# Patient Record
Sex: Female | Born: 1975 | Race: White | Hispanic: No | Marital: Married | Smoking: Never smoker
Health system: Southern US, Community
[De-identification: ages and names within clinical notes are randomized; demographics above are authoritative.]

## PROBLEM LIST (undated history)

## (undated) DIAGNOSIS — R519 Headache, unspecified: Secondary | ICD-10-CM

## (undated) DIAGNOSIS — T753XXA Motion sickness, initial encounter: Secondary | ICD-10-CM

## (undated) DIAGNOSIS — R000 Tachycardia, unspecified: Secondary | ICD-10-CM

## (undated) DIAGNOSIS — Z973 Presence of spectacles and contact lenses: Secondary | ICD-10-CM

## (undated) DIAGNOSIS — Z9889 Other specified postprocedural states: Secondary | ICD-10-CM

## (undated) DIAGNOSIS — R112 Nausea with vomiting, unspecified: Secondary | ICD-10-CM

## (undated) DIAGNOSIS — T8859XA Other complications of anesthesia, initial encounter: Secondary | ICD-10-CM

## (undated) DIAGNOSIS — I10 Essential (primary) hypertension: Secondary | ICD-10-CM

## (undated) DIAGNOSIS — G629 Polyneuropathy, unspecified: Secondary | ICD-10-CM

## (undated) DIAGNOSIS — E785 Hyperlipidemia, unspecified: Secondary | ICD-10-CM

## (undated) DIAGNOSIS — T4145XA Adverse effect of unspecified anesthetic, initial encounter: Secondary | ICD-10-CM

## (undated) DIAGNOSIS — R51 Headache: Secondary | ICD-10-CM

## (undated) HISTORY — PX: TONSILLECTOMY: SUR1361

## (undated) HISTORY — PX: ESOPHAGOGASTRODUODENOSCOPY: SHX1529

## (undated) HISTORY — DX: Hyperlipidemia, unspecified: E78.5

## (undated) HISTORY — PX: CHOLECYSTECTOMY: SHX55

## (undated) HISTORY — PX: TUBAL LIGATION: SHX77

## (undated) HISTORY — PX: ENDOMETRIAL ABLATION: SHX621

---

## 2002-08-08 HISTORY — PX: REDUCTION MAMMAPLASTY: SUR839

## 2005-02-04 ENCOUNTER — Emergency Department: Payer: Self-pay | Admitting: Emergency Medicine

## 2005-06-13 ENCOUNTER — Inpatient Hospital Stay: Payer: Self-pay | Admitting: General Surgery

## 2005-06-16 ENCOUNTER — Ambulatory Visit: Payer: Self-pay | Admitting: General Surgery

## 2006-05-11 ENCOUNTER — Ambulatory Visit: Payer: Self-pay | Admitting: Unknown Physician Specialty

## 2006-06-27 ENCOUNTER — Ambulatory Visit: Payer: Self-pay | Admitting: Gastroenterology

## 2006-09-14 ENCOUNTER — Ambulatory Visit: Payer: Self-pay | Admitting: Gastroenterology

## 2007-05-23 ENCOUNTER — Ambulatory Visit: Payer: Self-pay | Admitting: Family Medicine

## 2010-09-01 ENCOUNTER — Ambulatory Visit: Payer: Self-pay

## 2013-10-22 ENCOUNTER — Ambulatory Visit: Payer: Self-pay

## 2013-11-01 ENCOUNTER — Ambulatory Visit: Payer: Self-pay

## 2015-08-04 ENCOUNTER — Other Ambulatory Visit: Payer: Self-pay | Admitting: Nurse Practitioner

## 2015-08-04 DIAGNOSIS — Z1231 Encounter for screening mammogram for malignant neoplasm of breast: Secondary | ICD-10-CM

## 2015-08-24 ENCOUNTER — Ambulatory Visit
Admission: RE | Admit: 2015-08-24 | Discharge: 2015-08-24 | Disposition: A | Discharge status: Home / Self Care | Payer: BC Managed Care – PPO | Source: Ambulatory Visit | Attending: Nurse Practitioner | Admitting: Nurse Practitioner

## 2015-08-24 DIAGNOSIS — Z1231 Encounter for screening mammogram for malignant neoplasm of breast: Secondary | ICD-10-CM

## 2016-05-05 DIAGNOSIS — G5622 Lesion of ulnar nerve, left upper limb: Secondary | ICD-10-CM | POA: Insufficient documentation

## 2016-06-01 ENCOUNTER — Encounter: Payer: Self-pay | Admitting: *Deleted

## 2016-06-08 ENCOUNTER — Ambulatory Visit
Admission: RE | Admit: 2016-06-08 | Discharge: 2016-06-08 | Disposition: A | Discharge status: Home / Self Care | Payer: BC Managed Care – PPO | Source: Ambulatory Visit | Attending: Surgery | Admitting: Surgery

## 2016-06-08 ENCOUNTER — Encounter
Admission: RE | Disposition: A | Discharge status: Home / Self Care | Payer: Self-pay | Source: Ambulatory Visit | Attending: Surgery

## 2016-06-08 ENCOUNTER — Ambulatory Visit: Payer: BC Managed Care – PPO | Admitting: Anesthesiology

## 2016-06-08 DIAGNOSIS — G5622 Lesion of ulnar nerve, left upper limb: Secondary | ICD-10-CM | POA: Insufficient documentation

## 2016-06-08 DIAGNOSIS — I10 Essential (primary) hypertension: Secondary | ICD-10-CM | POA: Diagnosis not present

## 2016-06-08 DIAGNOSIS — Z79899 Other long term (current) drug therapy: Secondary | ICD-10-CM | POA: Insufficient documentation

## 2016-06-08 HISTORY — DX: Tachycardia, unspecified: R00.0

## 2016-06-08 HISTORY — DX: Other complications of anesthesia, initial encounter: T88.59XA

## 2016-06-08 HISTORY — DX: Headache: R51

## 2016-06-08 HISTORY — DX: Nausea with vomiting, unspecified: Z98.890

## 2016-06-08 HISTORY — DX: Presence of spectacles and contact lenses: Z97.3

## 2016-06-08 HISTORY — DX: Essential (primary) hypertension: I10

## 2016-06-08 HISTORY — DX: Adverse effect of unspecified anesthetic, initial encounter: T41.45XA

## 2016-06-08 HISTORY — PX: ULNAR NERVE TRANSPOSITION: SHX2595

## 2016-06-08 HISTORY — DX: Motion sickness, initial encounter: T75.3XXA

## 2016-06-08 HISTORY — DX: Headache, unspecified: R51.9

## 2016-06-08 HISTORY — DX: Other specified postprocedural states: R11.2

## 2016-06-08 SURGERY — ULNAR NERVE DECOMPRESSION/TRANSPOSITION
Anesthesia: Regional | Laterality: Left | Wound class: Clean

## 2016-06-08 MED ORDER — ACETAMINOPHEN 160 MG/5ML PO SOLN: 325.0000 mg | ORAL | Status: DC | PRN

## 2016-06-08 MED ORDER — METOCLOPRAMIDE HCL 5 MG/ML IJ SOLN: 5.0000 mg | Freq: Three times a day (TID) | INTRAMUSCULAR | Status: DC | PRN

## 2016-06-08 MED ORDER — OXYCODONE HCL 5 MG PO TABS: 5.0000 mg | ORAL_TABLET | Freq: Once | ORAL | Status: DC | PRN

## 2016-06-08 MED ORDER — LIDOCAINE HCL (CARDIAC) 20 MG/ML IV SOLN
INTRAVENOUS | Status: DC | PRN
  Administered 2016-06-08: 50 mg via INTRAVENOUS

## 2016-06-08 MED ORDER — HYDROCODONE-ACETAMINOPHEN 5-325 MG PO TABS: 1.0000 | ORAL_TABLET | Freq: Four times a day (QID) | ORAL | 0 refills | Status: DC | PRN

## 2016-06-08 MED ORDER — ONDANSETRON HCL 4 MG/2ML IJ SOLN: 4.0000 mg | Freq: Once | INTRAMUSCULAR | Status: DC | PRN

## 2016-06-08 MED ORDER — HYDROCODONE-ACETAMINOPHEN 5-325 MG PO TABS: 1.0000 | ORAL_TABLET | ORAL | Status: DC | PRN

## 2016-06-08 MED ORDER — METOCLOPRAMIDE HCL 5 MG PO TABS: 5.0000 mg | ORAL_TABLET | Freq: Three times a day (TID) | ORAL | Status: DC | PRN

## 2016-06-08 MED ORDER — SCOPOLAMINE 1 MG/3DAYS TD PT72
1.0000 | MEDICATED_PATCH | Freq: Once | TRANSDERMAL | Status: DC
  Administered 2016-06-08: 1.5 mg via TRANSDERMAL

## 2016-06-08 MED ORDER — BUPIVACAINE HCL (PF) 0.5 % IJ SOLN
INTRAMUSCULAR | Status: DC | PRN
  Administered 2016-06-08: 10 mL

## 2016-06-08 MED ORDER — FENTANYL CITRATE (PF) 100 MCG/2ML IJ SOLN
INTRAMUSCULAR | Status: DC | PRN
  Administered 2016-06-08: 100 ug via INTRAVENOUS

## 2016-06-08 MED ORDER — ACETAMINOPHEN 325 MG PO TABS: 325.0000 mg | ORAL_TABLET | ORAL | Status: DC | PRN

## 2016-06-08 MED ORDER — CEFAZOLIN SODIUM-DEXTROSE 2-4 GM/100ML-% IV SOLN
2.0000 g | Freq: Once | INTRAVENOUS | Status: AC
  Administered 2016-06-08: 2 g via INTRAVENOUS

## 2016-06-08 MED ORDER — OXYCODONE HCL 5 MG/5ML PO SOLN: 5.0000 mg | Freq: Once | ORAL | Status: DC | PRN

## 2016-06-08 MED ORDER — LACTATED RINGERS IV SOLN
INTRAVENOUS | Status: DC
  Administered 2016-06-08 (×2): via INTRAVENOUS

## 2016-06-08 MED ORDER — ROPIVACAINE HCL 5 MG/ML IJ SOLN
INTRAMUSCULAR | Status: DC | PRN
  Administered 2016-06-08: 35 mL via PERINEURAL

## 2016-06-08 MED ORDER — POTASSIUM CHLORIDE IN NACL 20-0.9 MEQ/L-% IV SOLN: INTRAVENOUS | Status: DC

## 2016-06-08 MED ORDER — ONDANSETRON HCL 4 MG/2ML IJ SOLN
INTRAMUSCULAR | Status: DC | PRN
  Administered 2016-06-08: 4 mg via INTRAVENOUS

## 2016-06-08 MED ORDER — PROPOFOL 500 MG/50ML IV EMUL
INTRAVENOUS | Status: DC | PRN
  Administered 2016-06-08: 50 ug/kg/min via INTRAVENOUS

## 2016-06-08 MED ORDER — FENTANYL CITRATE (PF) 100 MCG/2ML IJ SOLN: 25.0000 ug | INTRAMUSCULAR | Status: DC | PRN

## 2016-06-08 MED ORDER — MIDAZOLAM HCL 2 MG/2ML IJ SOLN
INTRAMUSCULAR | Status: DC | PRN
  Administered 2016-06-08: 2 mg via INTRAVENOUS

## 2016-06-08 MED ORDER — DEXAMETHASONE SODIUM PHOSPHATE 4 MG/ML IJ SOLN
INTRAMUSCULAR | Status: DC | PRN
  Administered 2016-06-08: 8 mg via INTRAVENOUS
  Administered 2016-06-08: 4 mg via PERINEURAL

## 2016-06-08 MED ORDER — ONDANSETRON HCL 4 MG/2ML IJ SOLN: 4.0000 mg | Freq: Four times a day (QID) | INTRAMUSCULAR | Status: DC | PRN

## 2016-06-08 MED ORDER — ONDANSETRON HCL 4 MG PO TABS: 4.0000 mg | ORAL_TABLET | Freq: Four times a day (QID) | ORAL | Status: DC | PRN

## 2016-06-08 SURGICAL SUPPLY — 31 items
ADHESIVE MASTISOL STRL (MISCELLANEOUS) ×2 IMPLANT
BANDAGE ELASTIC 3 LF NS (GAUZE/BANDAGES/DRESSINGS) ×2 IMPLANT
BANDAGE ELASTIC 4 LF NS (GAUZE/BANDAGES/DRESSINGS) ×2 IMPLANT
BNDG COHESIVE 4X5 TAN STRL (GAUZE/BANDAGES/DRESSINGS) ×2 IMPLANT
BNDG ESMARK 4X12 TAN STRL LF (GAUZE/BANDAGES/DRESSINGS) ×2 IMPLANT
CANISTER SUCT 1200ML W/VALVE (MISCELLANEOUS) ×2 IMPLANT
CHLORAPREP W/TINT 26ML (MISCELLANEOUS) ×2 IMPLANT
CORD BIP STRL DISP 12FT (MISCELLANEOUS) ×2 IMPLANT
COVER LIGHT HANDLE UNIVERSAL (MISCELLANEOUS) ×4 IMPLANT
DRAPE U-SHAPE 48X52 POLY STRL (PACKS) ×2 IMPLANT
GAUZE PETRO XEROFOAM 1X8 (MISCELLANEOUS) ×2 IMPLANT
GAUZE SPONGE 4X4 12PLY STRL (GAUZE/BANDAGES/DRESSINGS) ×2 IMPLANT
GLOVE BIO SURGEON STRL SZ7.5 (GLOVE) ×2 IMPLANT
GLOVE INDICATOR 8.0 STRL GRN (GLOVE) ×2 IMPLANT
GOWN STRL REUS W/ TWL LRG LVL3 (GOWN DISPOSABLE) ×1 IMPLANT
GOWN STRL REUS W/ TWL XL LVL3 (GOWN DISPOSABLE) ×1 IMPLANT
GOWN STRL REUS W/TWL LRG LVL3 (GOWN DISPOSABLE) ×1
GOWN STRL REUS W/TWL XL LVL3 (GOWN DISPOSABLE) ×1
KIT ROOM TURNOVER OR (KITS) ×2 IMPLANT
LOOP VESSEL RED MINI 1.3X0.9 (MISCELLANEOUS) ×1 IMPLANT
LOOPS RED MINI 1.3MMX0.9MM (MISCELLANEOUS) ×1
NS IRRIG 500ML POUR BTL (IV SOLUTION) ×2 IMPLANT
PACK EXTREMITY ARMC (MISCELLANEOUS) ×2 IMPLANT
PAD GROUND ADULT SPLIT (MISCELLANEOUS) ×2 IMPLANT
STOCKINETTE IMPERVIOUS LG (DRAPES) ×2 IMPLANT
STRAP BODY AND KNEE 60X3 (MISCELLANEOUS) ×2 IMPLANT
STRIP CLOSURE SKIN 1/4X4 (GAUZE/BANDAGES/DRESSINGS) ×2 IMPLANT
SUT VIC AB 2-0 CT1 27 (SUTURE) ×1
SUT VIC AB 2-0 CT1 TAPERPNT 27 (SUTURE) ×1 IMPLANT
SUT VIC AB 3-0 SH 27 (SUTURE) ×1
SUT VIC AB 3-0 SH 27X BRD (SUTURE) ×1 IMPLANT

## 2016-06-08 NOTE — Progress Notes (Signed)
Assisted Clance Boll ANMD with left, ultrasound guided, supraclavicular block. Side rails up, monitors on throughout procedure. See vital signs in flow sheet. Tolerated Procedure well.

## 2016-06-08 NOTE — Transfer of Care (Signed)
Immediate Anesthesia Transfer of Care Note  Patient: Yvette Humphrey  Procedure(s) Performed: Procedure(s): SUBCUTANEOUS ANTERIOR ULNAR NERVE TRANSPOSITION LEFT ELBOW (Left)  Patient Location: PACU  Anesthesia Type: Regional  Level of Consciousness: awake, alert  and patient cooperative  Airway and Oxygen Therapy: Patient Spontanous Breathing and Patient connected to supplemental oxygen  Post-op Assessment: Post-op Vital signs reviewed, Patient's Cardiovascular Status Stable, Respiratory Function Stable, Patent Airway and No signs of Nausea or vomiting  Post-op Vital Signs: Reviewed and stable  Complications: No apparent anesthesia complications

## 2016-06-08 NOTE — Anesthesia Preprocedure Evaluation (Signed)
Anesthesia Evaluation  Patient identified by MRN, date of birth, ID band Patient awake    Reviewed: Allergy & Precautions, H&P , NPO status , Patient's Chart, lab work & pertinent test results  History of Anesthesia Complications (+) PONV and history of anesthetic complications  Airway Mallampati: II  TM Distance: >3 FB Neck ROM: full    Dental no notable dental hx.    Pulmonary    Pulmonary exam normal        Cardiovascular hypertension, Normal cardiovascular exam+ dysrhythmias      Neuro/Psych  Headaches,    GI/Hepatic   Endo/Other    Renal/GU      Musculoskeletal   Abdominal   Peds  Hematology   Anesthesia Other Findings   Reproductive/Obstetrics                             Anesthesia Physical Anesthesia Plan  ASA: II  Anesthesia Plan: Regional   Post-op Pain Management:    Induction:   Airway Management Planned:   Additional Equipment:   Intra-op Plan:   Post-operative Plan:   Informed Consent: I have reviewed the patients History and Physical, chart, labs and discussed the procedure including the risks, benefits and alternatives for the proposed anesthesia with the patient or authorized representative who has indicated his/her understanding and acceptance.     Plan Discussed with:   Anesthesia Plan Comments:         Anesthesia Quick Evaluation

## 2016-06-08 NOTE — Discharge Instructions (Addendum)
General Anesthesia, Adult, Care After Refer to this sheet in the next few weeks. These instructions provide you with information on caring for yourself after your procedure. Your health care provider may also give you more specific instructions. Your treatment has been planned according to current medical practices, but problems sometimes occur. Call your health care provider if you have any problems or questions after your procedure. WHAT TO EXPECT AFTER THE PROCEDURE After the procedure, it is typical to experience:  Sleepiness.  Nausea and vomiting. HOME CARE INSTRUCTIONS  For the first 24 hours after general anesthesia:  Have a responsible person with you.  Do not drive a car. If you are alone, do not take public transportation.  Do not drink alcohol.  Do not take medicine that has not been prescribed by your health care provider.  Do not sign important papers or make important decisions.  You may resume a normal diet and activities as directed by your health care provider.  Change bandages (dressings) as directed.  If you have questions or problems that seem related to general anesthesia, call the hospital and ask for the anesthetist or anesthesiologist on call. SEEK MEDICAL CARE IF:  You have nausea and vomiting that continue the day after anesthesia.  You develop a rash. SEEK IMMEDIATE MEDICAL CARE IF:   You have difficulty breathing.  You have chest pain.  You have any allergic problems.   This information is not intended to replace advice given to you by your health care provider. Make sure you discuss any questions you have with your health care provider.   Document Released: 10/31/2000 Document Revised: 08/15/2014 Document Reviewed: 11/23/2011 Elsevier Interactive Patient Education 2016 [employer].  Keep dressing dry and intact. Keep hand elevated above heart level. May shower after dressing removed on postop day 4 (Sunday). Cover sutures with Band-Aids  or Ace-wrap after drying off. Use sling as necessary for comfort. Apply ice to affected area frequently. Take ibuprofen 600 mg TID with meals for 7-10 days, then as necessary. Take pain medication as prescribed when needed.  Return for follow-up in 10-14 days or as scheduled.

## 2016-06-08 NOTE — Anesthesia Postprocedure Evaluation (Signed)
Anesthesia Post Note  Patient: ADLYN FIFE  Procedure(s) Performed: Procedure(s) (LRB): SUBCUTANEOUS ANTERIOR ULNAR NERVE TRANSPOSITION LEFT ELBOW (Left)  Patient location during evaluation: PACU Anesthesia Type: Regional and MAC Level of consciousness: awake and alert and oriented Pain management: satisfactory to patient Vital Signs Assessment: post-procedure vital signs reviewed and stable Respiratory status: spontaneous breathing, nonlabored ventilation and respiratory function stable Cardiovascular status: blood pressure returned to baseline and stable Postop Assessment: Adequate PO intake and No signs of nausea or vomiting Anesthetic complications: no    Raliegh Ip

## 2016-06-08 NOTE — H&P (Signed)
Paper H&P to be scanned into permanent record. H&P reviewed. No changes.

## 2016-06-08 NOTE — Anesthesia Procedure Notes (Signed)
Anesthesia Regional Block:  Supraclavicular block  Pre-Anesthetic Checklist: ,, timeout performed, Correct Patient, Correct Site, Correct Laterality, Correct Procedure, Correct Position, site marked, Risks and benefits discussed, Surgical consent,  Pre-op evaluation,  At surgeon's request  Laterality: Left  Prep: chloraprep       Needles:  Injection technique: Single-shot  Needle Type: Stimiplex      Needle Gauge: 21 and 21 G    Additional Needles:  Procedures: ultrasound guided (picture in chart) Supraclavicular block Narrative:  Start time: 06/08/2016 11:56 AM End time: 06/08/2016 12:02 PM Injection made incrementally with aspirations every 5 mL.  Performed by: Personally  Anesthesiologist: Ronelle Nigh  Additional Notes: Functioning IV was confirmed and monitors applied.  Sterile prep and drape,hand hygiene and sterile gloves were used.Ultrasound guidance: relevant anatomy identified, needle position confirmed, local anesthetic spread visualized around nerve(s)., vascular puncture avoided.  Image printed for medical record.  Negative aspiration and negative test dose prior to incremental administration of local anesthetic. The patient tolerated the procedure well. Vitals signes recorded in RN notes.

## 2016-06-08 NOTE — Op Note (Signed)
06/08/2016  1:41 PM  Patient:   Yvette Humphrey  Pre-Op Diagnosis:   Left cubital tunnel syndrome.  Post-Op Diagnosis:   Same  Procedure:   Subcutaneous anterior transposition ulnar nerve, left elbow.  Surgeon:   Pascal Lux, MD  Assistant:   None  Anesthesia:   Interscalene block with IV sedation  Findings:   As above.  Complications:   None  EBL:   2 cc  Fluids:   1100 cc crystalloid  TT:   46 minutes at 250 mmHg  Drains:   None  Closure:   3-0 Vicryl subcuticular sutures  Brief Clinical Note:   The patient is a 40 year old female with a history of pain and paresthesias radiating from the elbow to the left ring and little fingers of her left hand. The patient's history and examination were consistent with cubital tunnel syndrome, although an EMG was inconclusive. The patient presents at this time for subcutaneous anterior transposition of the ulnar nerve at the left elbow.  Procedure:   The patient underwent placement of an interscalene block in the preoperative holding area before she was brought into the operating room and lain in the supine position. After adequate IV sedation was obtained, the patient's left upper extremity was prepped with ChloraPrep solution before being draped sterilely. Preoperative antibiotics were administered. After performing a timeout to verify the appropriate surgical site, the limb was exsanguinated with an Esmarch and the tourniquet inflated to 250 mmHg. An approximately 6-7 cm curvilinear incision was made along the course of the ulnar nerve posterior to the medial epicondyle. The incision was carried down through the subcutaneous tissues with care taken to avoid the small branches of the medial antebrachial nerve to expose the sheath overlying the cubital tunnel. The ulnar nerve was identified at the proximal end of the tunnel and was dissected free. The nerve was then carefully followed as the roof of the cubital tunnel was released from  proximal to distal. Distally, the fascia overlying the pronator muscle was released for several centimeters. The nerve was clearly thickened just proximal to the pronator fascia, indicating the most likely source of the nerve compression. A vessel loop was passed around the nerve and used to provide gentle traction on the nerve while circumferential dissection was carried out under loupe magnification using bipolar electrocautery and tenotomy scissors.   Once the nerve was fully mobilized, the anterior tissues were elevated as a flap just superficial to the fascia overlying the flexor wad and a pocket created to accept the nerve. Care was taken to be sure that there was no undue tension along the nerve either proximally or distally. The nerve was carefully retracted while several 2-0 Vicryl interrupted sutures were placed to reapproximate the flap to the medial epicondylar soft tissues, thereby creating a "sling" for the ulnar nerve. The cubital tunnel itself was reapproximated using several 2-0 Vicryl interrupted sutures in order to prevent the nerve from falling back into the cubital tunnel.   The wound was copiously irrigated with sterile saline solution before the subcutaneous tissues were closed using 2-0 Vicryl interrupted sutures. The skin was closed using 3-0 Vicryl subcuticular sutures before applying benzoin and Steri-Strips to the skin. A total of 10 cc of 0.5% plain Sensorcaine was injected in and around the incision to help with postoperative analgesia. A sterile bulky dressing was applied to the arm before the patient was placed into a sling. The patient was then awakened, extubated, and returned to the recovery room in  satisfactory condition after tolerating the procedure well.

## 2016-06-09 ENCOUNTER — Encounter: Payer: Self-pay | Admitting: Surgery

## 2016-12-02 ENCOUNTER — Other Ambulatory Visit: Payer: Self-pay | Admitting: Nurse Practitioner

## 2016-12-02 DIAGNOSIS — Z1231 Encounter for screening mammogram for malignant neoplasm of breast: Secondary | ICD-10-CM

## 2016-12-29 ENCOUNTER — Ambulatory Visit
Admission: RE | Admit: 2016-12-29 | Discharge: 2016-12-29 | Disposition: A | Discharge status: Home / Self Care | Payer: BC Managed Care – PPO | Source: Ambulatory Visit | Attending: Nurse Practitioner | Admitting: Nurse Practitioner

## 2016-12-29 DIAGNOSIS — R928 Other abnormal and inconclusive findings on diagnostic imaging of breast: Secondary | ICD-10-CM | POA: Insufficient documentation

## 2016-12-29 DIAGNOSIS — Z1231 Encounter for screening mammogram for malignant neoplasm of breast: Secondary | ICD-10-CM

## 2017-01-03 ENCOUNTER — Other Ambulatory Visit: Payer: Self-pay | Admitting: Nurse Practitioner

## 2017-01-03 DIAGNOSIS — N6320 Unspecified lump in the left breast, unspecified quadrant: Secondary | ICD-10-CM

## 2017-01-03 DIAGNOSIS — R928 Other abnormal and inconclusive findings on diagnostic imaging of breast: Secondary | ICD-10-CM

## 2017-01-04 ENCOUNTER — Ambulatory Visit
Admission: RE | Admit: 2017-01-04 | Discharge: 2017-01-04 | Disposition: A | Discharge status: Home / Self Care | Payer: BC Managed Care – PPO | Source: Ambulatory Visit | Attending: Nurse Practitioner | Admitting: Nurse Practitioner

## 2017-01-04 DIAGNOSIS — N6320 Unspecified lump in the left breast, unspecified quadrant: Secondary | ICD-10-CM

## 2017-01-04 DIAGNOSIS — N6321 Unspecified lump in the left breast, upper outer quadrant: Secondary | ICD-10-CM | POA: Diagnosis not present

## 2017-01-04 DIAGNOSIS — R928 Other abnormal and inconclusive findings on diagnostic imaging of breast: Secondary | ICD-10-CM

## 2017-07-27 ENCOUNTER — Other Ambulatory Visit: Payer: Self-pay | Admitting: Nurse Practitioner

## 2017-07-27 DIAGNOSIS — F5101 Primary insomnia: Secondary | ICD-10-CM

## 2017-07-27 MED ORDER — ZOLPIDEM TARTRATE 10 MG PO TABS: 10.0000 mg | ORAL_TABLET | Freq: Every evening | ORAL | 2 refills | Status: DC | PRN

## 2017-07-27 NOTE — Progress Notes (Signed)
Approved request for ambien with 2 refills.

## 2017-08-03 ENCOUNTER — Ambulatory Visit: Payer: Self-pay | Admitting: Nurse Practitioner

## 2017-08-03 DIAGNOSIS — R5381 Other malaise: Secondary | ICD-10-CM | POA: Insufficient documentation

## 2017-08-03 DIAGNOSIS — H6123 Impacted cerumen, bilateral: Secondary | ICD-10-CM | POA: Insufficient documentation

## 2017-08-03 DIAGNOSIS — D751 Secondary polycythemia: Secondary | ICD-10-CM | POA: Insufficient documentation

## 2017-08-03 DIAGNOSIS — F331 Major depressive disorder, recurrent, moderate: Secondary | ICD-10-CM | POA: Insufficient documentation

## 2017-08-03 DIAGNOSIS — J0191 Acute recurrent sinusitis, unspecified: Secondary | ICD-10-CM | POA: Insufficient documentation

## 2017-08-03 DIAGNOSIS — I10 Essential (primary) hypertension: Secondary | ICD-10-CM | POA: Insufficient documentation

## 2017-08-03 DIAGNOSIS — G43019 Migraine without aura, intractable, without status migrainosus: Secondary | ICD-10-CM | POA: Insufficient documentation

## 2017-08-03 DIAGNOSIS — J301 Allergic rhinitis due to pollen: Secondary | ICD-10-CM | POA: Insufficient documentation

## 2017-08-03 DIAGNOSIS — F5105 Insomnia due to other mental disorder: Secondary | ICD-10-CM | POA: Insufficient documentation

## 2017-08-03 DIAGNOSIS — M26609 Unspecified temporomandibular joint disorder, unspecified side: Secondary | ICD-10-CM | POA: Insufficient documentation

## 2017-08-03 DIAGNOSIS — M722 Plantar fascial fibromatosis: Secondary | ICD-10-CM | POA: Insufficient documentation

## 2017-08-03 DIAGNOSIS — M7712 Lateral epicondylitis, left elbow: Secondary | ICD-10-CM | POA: Insufficient documentation

## 2017-08-03 DIAGNOSIS — F411 Generalized anxiety disorder: Secondary | ICD-10-CM | POA: Insufficient documentation

## 2017-09-01 ENCOUNTER — Encounter: Payer: Self-pay | Admitting: Nurse Practitioner

## 2017-09-01 ENCOUNTER — Ambulatory Visit: Payer: BC Managed Care – PPO | Admitting: Nurse Practitioner

## 2017-09-01 VITALS — BP 123/78 | HR 86 | Resp 16 | Ht 62.0 in | Wt 182.6 lb

## 2017-09-01 DIAGNOSIS — F5101 Primary insomnia: Secondary | ICD-10-CM

## 2017-09-01 DIAGNOSIS — G43009 Migraine without aura, not intractable, without status migrainosus: Secondary | ICD-10-CM | POA: Diagnosis not present

## 2017-09-01 DIAGNOSIS — F411 Generalized anxiety disorder: Secondary | ICD-10-CM | POA: Diagnosis not present

## 2017-09-01 DIAGNOSIS — I10 Essential (primary) hypertension: Secondary | ICD-10-CM

## 2017-09-01 DIAGNOSIS — R928 Other abnormal and inconclusive findings on diagnostic imaging of breast: Secondary | ICD-10-CM

## 2017-09-01 MED ORDER — SERTRALINE HCL 50 MG PO TABS: 75.0000 mg | ORAL_TABLET | Freq: Every day | ORAL | 5 refills | Status: DC

## 2017-09-01 MED ORDER — ALPRAZOLAM 0.5 MG PO TABS: 0.5000 mg | ORAL_TABLET | Freq: Every day | ORAL | 3 refills | Status: DC | PRN

## 2017-09-01 MED ORDER — BUTALBITAL-APAP-CAFFEINE 50-300-40 MG PO CAPS: 1.0000 | ORAL_CAPSULE | ORAL | 3 refills | Status: DC | PRN

## 2017-09-01 NOTE — Progress Notes (Addendum)
[facility] [place], [place] [address]  Internal MEDICINE  Office Visit Note  Patient Name: Yvette Humphrey  [id number]  [id number]  Date of Service: 09/01/2017  Chief Complaint  Patient presents with  . Follow-up    medications    HPI  Pt is here for routine follow up.    Current Medication: Outpatient Encounter Medications as of 09/01/2017  Medication Sig  . ALPRAZolam (XANAX) 0.5 MG tablet 0.5 mg.  . BIOTIN PO Take by mouth daily.  . Butalbital-APAP-Caffeine (FIORICET PO) Take by mouth as needed. migraines  . cetirizine (ZYRTEC) 10 MG tablet Take 10 mg by mouth daily.  . cyanocobalamin ([employer] VITAMIN B12) 1000 MCG tablet Take by mouth.  . cyanocobalamin 500 MCG tablet Take 500 mcg by mouth daily.  Marland Kitchen gabapentin (NEURONTIN) 300 MG capsule Take 300 mg by mouth at bedtime.  Marland Kitchen HYDROcodone-acetaminophen (NORCO) 5-325 MG tablet Take 1-2 tablets by mouth every 6 (six) hours as needed for moderate pain. MAXIMUM TOTAL ACETAMINOPHEN DOSE IS 4000 MG PER DAY  . Lysine 1000 MG TABS Take by mouth daily as needed.  . Magnesium 250 MG TABS Take by mouth daily.  . meloxicam (MOBIC) 7.5 MG tablet Take 7.5 mg by mouth daily.  . metoprolol succinate (TOPROL-XL) 50 MG 24 hr tablet Take 50 mg by mouth daily. Take with or immediately following a meal.  . Multiple Vitamins-Minerals (MULTIVITAMIN WOMEN PO) Take by mouth daily.  Marland Kitchen POTASSIUM CHLORIDE PO Take 10 mcg by mouth daily.  . sertraline (ZOLOFT) 50 MG tablet Take 50 mg by mouth daily.  Marland Kitchen zolpidem (AMBIEN) 10 MG tablet Take 1 tablet (10 mg total) by mouth at bedtime as needed for sleep.   No facility-administered encounter medications on file as of 09/01/2017.     Surgical History: Past Surgical History:  Procedure Laterality Date  . CHOLECYSTECTOMY    . ENDOMETRIAL ABLATION     approx 2009  . ESOPHAGOGASTRODUODENOSCOPY    . REDUCTION MAMMAPLASTY Bilateral 2004  . TONSILLECTOMY    . TUBAL LIGATION    . ULNAR  NERVE TRANSPOSITION Left 06/08/2016   Procedure: SUBCUTANEOUS ANTERIOR ULNAR NERVE TRANSPOSITION LEFT ELBOW;  Surgeon: Corky Mull, MD;  Location: [facility];  Service: Orthopedics;  Laterality: Left;    Medical History: Past Medical History:  Diagnosis Date  . Complication of anesthesia   . Headache    migraines, 1x/mo  . Hypertension   . Motion sickness    back seat cars  . PONV (postoperative nausea and vomiting)   . Tachycardia   . Wears contact lenses     Family History: Family History  Problem Relation Age of Onset  . Breast cancer Paternal Grandmother     Social History   Socioeconomic History  . Marital status: Married    Spouse name: Not on file  . Number of children: Not on file  . Years of education: Not on file  . Highest education level: Not on file  Social Needs  . Financial resource strain: Not on file  . Food insecurity - worry: Not on file  . Food insecurity - inability: Not on file  . Transportation needs - medical: Not on file  . Transportation needs - non-medical: Not on file  Occupational History  . Not on file  Tobacco Use  . Smoking status: Never Smoker  . Smokeless tobacco: Never Used  Substance and Sexual Activity  . Alcohol use: Yes    Alcohol/week: 0.6 oz  Types: 1 Glasses of wine per week  . Drug use: No  . Sexual activity: Not on file  Other Topics Concern  . Not on file  Social History Narrative  . Not on file      Review of Systems  Constitutional: Negative for activity change, chills, fatigue and unexpected weight change.  HENT: Negative for congestion, postnasal drip, rhinorrhea, sneezing and sore throat.   Eyes: Negative for redness.  Respiratory: Negative for cough, chest tightness, shortness of breath and wheezing.   Cardiovascular: Negative for chest pain and palpitations.  Gastrointestinal: Negative for abdominal pain, constipation, diarrhea, nausea and vomiting.  Endocrine: Negative for cold intolerance,  heat intolerance, polydipsia, polyphagia and polyuria.  Genitourinary: Negative for dysuria and frequency.  Musculoskeletal: Negative for arthralgias, back pain, joint swelling and neck pain.  Skin: Negative for rash.  Allergic/Immunologic: Negative.   Neurological: Positive for headaches. Negative for tremors and numbness.  Hematological: Negative.  Negative for adenopathy. Does not bruise/bleed easily.  Psychiatric/Behavioral: Positive for sleep disturbance. Negative for behavioral problems (Depression) and suicidal ideas. The patient is nervous/anxious.     Today's Vitals   09/01/17 1010  BP: 123/78  Pulse: 86  Resp: 16  SpO2: 97%  Weight: 182 lb 9.6 oz (82.8 kg)  Height: 5' 2"  (1.575 m)    Physical Exam  Constitutional: She is oriented to person, place, and time. She appears well-developed and well-nourished. No distress.  HENT:  Head: Normocephalic and atraumatic.  Mouth/Throat: Oropharynx is clear and moist. No oropharyngeal exudate.  Eyes: EOM are normal. Pupils are equal, round, and reactive to light.  Neck: Normal range of motion. Neck supple. No JVD present. No tracheal deviation present. No thyromegaly present.  Cardiovascular: Normal rate, regular rhythm and normal heart sounds. Exam reveals no gallop and no friction rub.  No murmur heard. Pulmonary/Chest: Effort normal and breath sounds normal. No respiratory distress. She has no wheezes. She has no rales. She exhibits no tenderness.  Abdominal: Soft. Bowel sounds are normal. There is no tenderness.  Musculoskeletal: Normal range of motion.  Lymphadenopathy:    She has no cervical adenopathy.  Neurological: She is alert and oriented to person, place, and time. No cranial nerve deficit.  Skin: Skin is warm and dry. She is not diaphoretic.  Psychiatric: She has a normal mood and affect. Her behavior is normal. Judgment and thought content normal.  Nursing note and vitals reviewed.   Assessment/Plan:  1. Essential  (primary) hypertension Stable. Continue bp medication as prescribed   2. Migraine without aura and without status migrainosus, not intractable Overall, well controlled.  - Butalbital-APAP-Caffeine (FIORICET) 50-300-40 MG CAPS; Take 1 capsule by mouth as needed. migraines  Dispense: 30 capsule; Refill: 3  3. Primary insomnia May take zolpidem 61m at bedtime if needed for acute anxiety  4. GAD (generalized anxiety disorder) Generally stable. Continue sertraline 761mevery day. May take alprazolam 0.20m50m/2 to 1 tablet daily if needed.  - sertraline (ZOLOFT) 50 MG tablet; Take 1.5 tablets (75 mg total) by mouth daily.  Dispense: 45 tablet; Refill: 5 - ALPRAZolam (XANAX) 0.5 MG tablet; Take 1 tablet (0.5 mg total) by mouth daily as needed for anxiety.  Dispense: 30 tablet; Refill: 3  5. Abnormal mammogram of left breast - [place] UNI LEFT INC AXILLA; Future  She should return for CPE in 4 months and sooner if needed.  General Counseling: [place] gaylerderstanding of the findings of todays visit and agrees with plan of treatment. I  have discussed any further diagnostic evaluation that may be needed or ordered today. We also reviewed her medications today. she has been encouraged to call the office with any questions or concerns that should arise related to todays visit.    This patient was seen by Leretha Pol, FNP- C in Collaboration with Dr Lavera Guise as a part of collaborative care agreement   Time spent: 31 Minutes     Dr Lavera Guise Internal medicine

## 2017-09-25 ENCOUNTER — Other Ambulatory Visit: Payer: Self-pay | Admitting: Nurse Practitioner

## 2017-09-25 DIAGNOSIS — J0111 Acute recurrent frontal sinusitis: Secondary | ICD-10-CM

## 2017-09-25 MED ORDER — CLARITHROMYCIN 500 MG PO TABS: 500.0000 mg | ORAL_TABLET | Freq: Two times a day (BID) | ORAL | 0 refills | Status: DC

## 2017-09-25 NOTE — Progress Notes (Signed)
Sent in biaxin 556m bid for 10 days.

## 2017-09-29 ENCOUNTER — Other Ambulatory Visit: Payer: Self-pay | Admitting: Nurse Practitioner

## 2017-09-29 DIAGNOSIS — J0111 Acute recurrent frontal sinusitis: Secondary | ICD-10-CM

## 2017-09-29 MED ORDER — PREDNISONE 10 MG (21) PO TBPK: ORAL_TABLET | ORAL | 0 refills | Status: DC

## 2017-09-29 MED ORDER — LEVOFLOXACIN 500 MG PO TABS: 500.0000 mg | ORAL_TABLET | Freq: Every day | ORAL | 0 refills | Status: DC

## 2017-09-29 NOTE — Progress Notes (Signed)
Still not well after treatment with biaxin. Will do levofloxacin 558m daily for 10 days and prednisone 6 day dose pack. Both sent to her pharmacy.

## 2017-10-02 ENCOUNTER — Other Ambulatory Visit: Payer: Self-pay

## 2017-10-02 MED ORDER — FLUCONAZOLE 150 MG PO TABS: ORAL_TABLET | ORAL | 0 refills | Status: DC

## 2017-10-09 ENCOUNTER — Ambulatory Visit
Admission: RE | Admit: 2017-10-09 | Discharge: 2017-10-09 | Disposition: A | Discharge status: Home / Self Care | Payer: BC Managed Care – PPO | Source: Ambulatory Visit | Attending: Nurse Practitioner | Admitting: Nurse Practitioner

## 2017-10-09 ENCOUNTER — Other Ambulatory Visit: Payer: Self-pay | Admitting: Nurse Practitioner

## 2017-10-09 DIAGNOSIS — R928 Other abnormal and inconclusive findings on diagnostic imaging of breast: Secondary | ICD-10-CM

## 2017-10-09 DIAGNOSIS — D242 Benign neoplasm of left breast: Secondary | ICD-10-CM | POA: Insufficient documentation

## 2017-10-31 ENCOUNTER — Other Ambulatory Visit: Payer: Self-pay | Admitting: Nurse Practitioner

## 2017-10-31 ENCOUNTER — Telehealth: Payer: Self-pay

## 2017-10-31 DIAGNOSIS — F5101 Primary insomnia: Secondary | ICD-10-CM

## 2017-10-31 MED ORDER — MELOXICAM 7.5 MG PO TABS: 7.5000 mg | ORAL_TABLET | Freq: Every day | ORAL | 2 refills | Status: DC

## 2017-10-31 MED ORDER — ZOLPIDEM TARTRATE 10 MG PO TABS: 10.0000 mg | ORAL_TABLET | Freq: Every evening | ORAL | 3 refills | Status: DC | PRN

## 2017-10-31 NOTE — Telephone Encounter (Signed)
Refilled zolpidem 66m #30 with 3 refills and sent to [facility].

## 2017-10-31 NOTE — Telephone Encounter (Signed)
Pt

## 2017-10-31 NOTE — Progress Notes (Signed)
Refilled zolpidem 79m #30 with 3 refills and sent to [facility].

## 2017-11-02 ENCOUNTER — Telehealth: Payer: Self-pay

## 2017-11-02 ENCOUNTER — Encounter: Payer: Self-pay | Admitting: Nurse Practitioner

## 2017-11-02 ENCOUNTER — Ambulatory Visit: Payer: BC Managed Care – PPO | Admitting: Nurse Practitioner

## 2017-11-02 VITALS — BP 121/93 | HR 78 | Temp 98.3°F | Resp 16 | Ht 62.0 in | Wt 185.8 lb

## 2017-11-02 DIAGNOSIS — J0111 Acute recurrent frontal sinusitis: Secondary | ICD-10-CM | POA: Diagnosis not present

## 2017-11-02 DIAGNOSIS — B379 Candidiasis, unspecified: Secondary | ICD-10-CM | POA: Insufficient documentation

## 2017-11-02 DIAGNOSIS — R05 Cough: Secondary | ICD-10-CM | POA: Diagnosis not present

## 2017-11-02 DIAGNOSIS — R059 Cough, unspecified: Secondary | ICD-10-CM | POA: Insufficient documentation

## 2017-11-02 MED ORDER — FLUCONAZOLE 150 MG PO TABS: ORAL_TABLET | ORAL | 0 refills | Status: DC

## 2017-11-02 MED ORDER — HYDROCOD POLST-CPM POLST ER 10-8 MG/5ML PO SUER: 5.0000 mL | Freq: Two times a day (BID) | ORAL | 0 refills | Status: DC | PRN

## 2017-11-02 MED ORDER — CLARITHROMYCIN 500 MG PO TABS: 500.0000 mg | ORAL_TABLET | Freq: Two times a day (BID) | ORAL | 0 refills | Status: DC

## 2017-11-02 NOTE — Progress Notes (Signed)
[facility] [place], [place] [address]  Internal MEDICINE  Office Visit Note  Patient Name: Yvette Humphrey  [id number]  [id number]  Date of Service: 11/02/2017  Chief Complaint  Patient presents with  . Cough  . Sinusitis     The patient is here for sick visit. She has had nasal congestion for about a week. Yesterday developed a significant cough with burning in her chest. Chest discomfort is only when she coughs. She was unable to sleep last night, as her cough kept her awake. Today, she took OTC cold and cough medication which did help. She states that cough is productive. Lots of mucus production. She has been treated twice over the past 6 weeks for similar infection. Took round of clarithromycin for 10 days followed by levaquin 571m daily and prednisone taper for 6 days. She did get better, initially. Symptoms restarted about a week or 2 after she finished the second treatment .  Pt is here for a sick visit.     Current Medication:  Outpatient Encounter Medications as of 11/02/2017  Medication Sig  . ALPRAZolam (XANAX) 0.5 MG tablet Take 1 tablet (0.5 mg total) by mouth daily as needed for anxiety.  .Marland KitchenBIOTIN PO Take by mouth daily.  . Butalbital-APAP-Caffeine (FIORICET) 50-300-40 MG CAPS Take 1 capsule by mouth as needed. migraines  . cetirizine (ZYRTEC) 10 MG tablet Take 10 mg by mouth daily.  . chlorpheniramine-HYDROcodone (TUSSIONEX PENNKINETIC ER) 10-8 MG/5ML SUER Take 5 mLs by mouth every 12 (twelve) hours as needed for cough.  . clarithromycin (BIAXIN) 500 MG tablet Take 1 tablet (500 mg total) by mouth 2 (two) times daily.  . cyanocobalamin ([employer] VITAMIN B12) 1000 MCG tablet Take by mouth.  . cyanocobalamin 500 MCG tablet Take 500 mcg by mouth daily.  . fluconazole (DIFLUCAN) 150 MG tablet Take 1 tab po once and then repeat again if symptoms persists  . gabapentin (NEURONTIN) 300 MG capsule Take 300 mg by mouth at bedtime.  .Marland Kitchen HYDROcodone-acetaminophen (NORCO) 5-325 MG tablet Take 1-2 tablets by mouth every 6 (six) hours as needed for moderate pain. MAXIMUM TOTAL ACETAMINOPHEN DOSE IS 4000 MG PER DAY  . levofloxacin (LEVAQUIN) 500 MG tablet Take 1 tablet (500 mg total) by mouth daily. (Patient not taking: Reported on 11/02/2017)  . Lysine 1000 MG TABS Take by mouth daily as needed.  . Magnesium 250 MG TABS Take by mouth daily.  . meloxicam (MOBIC) 7.5 MG tablet Take 1 tablet (7.5 mg total) by mouth daily.  . metoprolol succinate (TOPROL-XL) 50 MG 24 hr tablet Take 50 mg by mouth daily. Take with or immediately following a meal.  . Multiple Vitamins-Minerals (MULTIVITAMIN WOMEN PO) Take by mouth daily.  .Marland KitchenPOTASSIUM CHLORIDE PO Take 10 mcg by mouth daily.  . predniSONE (STERAPRED UNI-PAK 21 TAB) 10 MG (21) TBPK tablet 6 day taper - take by mouth as directed for 6 days (Patient not taking: Reported on 11/02/2017)  . sertraline (ZOLOFT) 50 MG tablet Take 1.5 tablets (75 mg total) by mouth daily.  .Marland Kitchenzolpidem (AMBIEN) 10 MG tablet Take 1 tablet (10 mg total) by mouth at bedtime as needed for sleep.  . [DISCONTINUED] clarithromycin (BIAXIN) 500 MG tablet Take 1 tablet (500 mg total) by mouth 2 (two) times daily.  . [DISCONTINUED] fluconazole (DIFLUCAN) 150 MG tablet Take 1 tab po once and then repeat again if symptoms persists   No facility-administered encounter medications on file as of 11/02/2017.  Medical History: Past Medical History:  Diagnosis Date  . Complication of anesthesia   . Headache    migraines, 1x/mo  . Hypertension   . Motion sickness    back seat cars  . PONV (postoperative nausea and vomiting)   . Tachycardia   . Wears contact lenses      Vital Signs: BP (!) 121/93   Pulse 78   Temp 98.3 F (36.8 C)   Resp 16   Ht 5' 2"  (1.575 m)   Wt 185 lb 12.8 oz (84.3 kg)   SpO2 97%   BMI 33.98 kg/m    Review of Systems  Constitutional: Positive for fatigue and fever. Negative for activity  change, chills and unexpected weight change.  HENT: Positive for congestion, postnasal drip, rhinorrhea, sore throat and voice change. Negative for sneezing.   Eyes: Negative.  Negative for redness.  Respiratory: Positive for cough. Negative for chest tightness, shortness of breath and wheezing.   Cardiovascular: Negative for chest pain and palpitations.  Gastrointestinal: Negative for abdominal pain, constipation, diarrhea, nausea and vomiting.  Endocrine: Negative for cold intolerance, heat intolerance, polydipsia, polyphagia and polyuria.  Genitourinary: Negative.  Negative for dysuria and frequency.  Musculoskeletal: Positive for back pain. Negative for arthralgias, joint swelling and neck pain.  Skin: Negative for rash.  Allergic/Immunologic: Positive for environmental allergies.  Neurological: Positive for headaches. Negative for tremors and numbness.  Hematological: Negative.  Negative for adenopathy. Does not bruise/bleed easily.  Psychiatric/Behavioral: Negative for behavioral problems (Depression), sleep disturbance and suicidal ideas. The patient is not nervous/anxious.     Physical Exam  Constitutional: She is oriented to person, place, and time. She appears well-developed and well-nourished. No distress.  HENT:  Head: Normocephalic and atraumatic.  Right Ear: Tympanic membrane and external ear normal.  Left Ear: There is swelling. Tympanic membrane is erythematous and bulging.  Mouth/Throat: Oropharynx is clear and moist. No oropharyngeal exudate.  Eyes: Pupils are equal, round, and reactive to light. EOM are normal.  Neck: Normal range of motion. Neck supple. No JVD present. No tracheal deviation present. No thyromegaly present.  Cardiovascular: Normal rate, regular rhythm and normal heart sounds. Exam reveals no gallop and no friction rub.  No murmur heard. Pulmonary/Chest: Effort normal and breath sounds normal. No respiratory distress. She has no wheezes. She has no rales.  She exhibits tenderness.  Deep, congested cough which is currently non-productive.   Abdominal: Soft. Bowel sounds are normal. There is no tenderness.  Musculoskeletal: Normal range of motion.  Lymphadenopathy:    She has no cervical adenopathy.  Neurological: She is alert and oriented to person, place, and time. No cranial nerve deficit.  Skin: Skin is warm and dry. She is not diaphoretic.  Psychiatric: She has a normal mood and affect. Her behavior is normal. Judgment and thought content normal.  Nursing note and vitals reviewed.  Assessment/Plan: 1. Acute recurrent frontal sinusitis Start treatment with biaxin 516m twice daily for 10 days. Rest and use OTC medication to help relieve symptoms.  - clarithromycin (BIAXIN) 500 MG tablet; Take 1 tablet (500 mg total) by mouth 2 (two) times daily.  Dispense: 20 tablet; Refill: 0  2. Cough Tussionex cough suppressant to help at night. Advised not to use during the day, as it will cause drowsiness. - chlorpheniramine-HYDROcodone (TUSSIONEX PENNKINETIC ER) 10-8 MG/5ML SUER; Take 5 mLs by mouth every 12 (twelve) hours as needed for cough.  Dispense: 115 mL; Refill: 0  3. Candidiasis - fluconazole (DIFLUCAN) 150 MG tablet;  Take 1 tab po once and then repeat again if symptoms persists  Dispense: 3 tablet; Refill: 0  General Counseling: arraya buck understanding of the findings of todays visit and agrees with plan of treatment. I have discussed any further diagnostic evaluation that may be needed or ordered today. We also reviewed her medications today. she has been encouraged to call the office with any questions or concerns that should arise related to todays visit.     Meds ordered this encounter  Medications  . clarithromycin (BIAXIN) 500 MG tablet    Sig: Take 1 tablet (500 mg total) by mouth 2 (two) times daily.    Dispense:  20 tablet    Refill:  0    Order Specific Question:   Supervising Provider    Answer:   Lavera Guise  [[id number]]  . fluconazole (DIFLUCAN) 150 MG tablet    Sig: Take 1 tab po once and then repeat again if symptoms persists    Dispense:  3 tablet    Refill:  0    Order Specific Question:   Supervising Provider    Answer:   Lavera Guise [place]  . chlorpheniramine-HYDROcodone (TUSSIONEX PENNKINETIC ER) 10-8 MG/5ML SUER    Sig: Take 5 mLs by mouth every 12 (twelve) hours as needed for cough.    Dispense:  115 mL    Refill:  0    Order Specific Question:   Supervising Provider    Answer:   Lavera Guise [[id number]]    Time spent: 15  Minutes

## 2017-11-02 NOTE — Telephone Encounter (Signed)
Hey. Can you call in promethazine/codeina cough suppressant. She can take 5-61ms po TID prn cough. #2063m with no refills. thanks

## 2017-11-03 ENCOUNTER — Telehealth: Payer: Self-pay

## 2017-11-03 NOTE — Telephone Encounter (Signed)
Pt informed of nex rx for cough med sent to pharmacy

## 2017-11-03 NOTE — Telephone Encounter (Signed)
Left vm for pharmacy to fill promethazine codeine cough syrup.  dbs

## 2017-12-26 ENCOUNTER — Encounter: Payer: Self-pay | Admitting: Nurse Practitioner

## 2017-12-26 ENCOUNTER — Ambulatory Visit: Payer: BC Managed Care – PPO | Admitting: Nurse Practitioner

## 2017-12-26 VITALS — BP 126/51 | HR 89 | Resp 16 | Ht 63.0 in | Wt 185.8 lb

## 2017-12-26 DIAGNOSIS — I10 Essential (primary) hypertension: Secondary | ICD-10-CM | POA: Diagnosis not present

## 2017-12-26 DIAGNOSIS — Z1239 Encounter for other screening for malignant neoplasm of breast: Secondary | ICD-10-CM

## 2017-12-26 DIAGNOSIS — Z1231 Encounter for screening mammogram for malignant neoplasm of breast: Secondary | ICD-10-CM | POA: Diagnosis not present

## 2017-12-26 DIAGNOSIS — G43009 Migraine without aura, not intractable, without status migrainosus: Secondary | ICD-10-CM | POA: Insufficient documentation

## 2017-12-26 DIAGNOSIS — Z79899 Other long term (current) drug therapy: Secondary | ICD-10-CM | POA: Insufficient documentation

## 2017-12-26 DIAGNOSIS — M722 Plantar fascial fibromatosis: Secondary | ICD-10-CM | POA: Diagnosis not present

## 2017-12-26 MED ORDER — MELOXICAM 7.5 MG PO TABS: 7.5000 mg | ORAL_TABLET | Freq: Two times a day (BID) | ORAL | 2 refills | Status: DC | PRN

## 2017-12-26 MED ORDER — METOPROLOL SUCCINATE ER 50 MG PO TB24: 50.0000 mg | ORAL_TABLET | Freq: Every day | ORAL | 5 refills | Status: DC

## 2017-12-26 NOTE — Progress Notes (Signed)
[facility] [place], [place] [address]  Internal MEDICINE  Office Visit Note  Patient Name: Yvette Humphrey  [id number]  [id number]  Date of Service: 12/26/2017   Pt is here for routine follow up.  Chief Complaint  Patient presents with  . Foot Pain    bilateral - worse when first getting up in the morning.     The patient c/o bilateral foot pain. This is worse when first getting up in the morning. Will get better, then worse again after she sits or rests for a few minutes. Does not seem to matter what shoes she is wearing or if she is barefoot.       Current Medication: Outpatient Encounter Medications as of 12/26/2017  Medication Sig  . ALPRAZolam (XANAX) 0.5 MG tablet Take 1 tablet (0.5 mg total) by mouth daily as needed for anxiety.  Marland Kitchen BIOTIN PO Take by mouth daily.  . Butalbital-APAP-Caffeine (FIORICET) 50-300-40 MG CAPS Take 1 capsule by mouth as needed. migraines  . cetirizine (ZYRTEC) 10 MG tablet Take 10 mg by mouth daily.  . cyanocobalamin ([employer] VITAMIN B12) 1000 MCG tablet Take by mouth.  . cyanocobalamin 500 MCG tablet Take 500 mcg by mouth daily.  Marland Kitchen Lysine 1000 MG TABS Take by mouth daily as needed.  . Magnesium 250 MG TABS Take by mouth daily.  . meloxicam (MOBIC) 7.5 MG tablet Take 1 tablet (7.5 mg total) by mouth 2 (two) times daily as needed for pain.  . metoprolol succinate (TOPROL-XL) 50 MG 24 hr tablet Take 1 tablet (50 mg total) by mouth daily. Take with or immediately following a meal.  . Multiple Vitamins-Minerals (MULTIVITAMIN WOMEN PO) Take by mouth daily.  . sertraline (ZOLOFT) 50 MG tablet Take 1.5 tablets (75 mg total) by mouth daily.  Marland Kitchen zolpidem (AMBIEN) 10 MG tablet Take 1 tablet (10 mg total) by mouth at bedtime as needed for sleep.  . [DISCONTINUED] meloxicam (MOBIC) 7.5 MG tablet Take 1 tablet (7.5 mg total) by mouth daily.  . [DISCONTINUED] metoprolol succinate (TOPROL-XL) 50 MG 24 hr tablet Take 50 mg by mouth daily.  Take with or immediately following a meal.  . chlorpheniramine-HYDROcodone (TUSSIONEX PENNKINETIC ER) 10-8 MG/5ML SUER Take 5 mLs by mouth every 12 (twelve) hours as needed for cough. (Patient not taking: Reported on 12/26/2017)  . clarithromycin (BIAXIN) 500 MG tablet Take 1 tablet (500 mg total) by mouth 2 (two) times daily. (Patient not taking: Reported on 12/26/2017)  . fluconazole (DIFLUCAN) 150 MG tablet Take 1 tab po once and then repeat again if symptoms persists (Patient not taking: Reported on 12/26/2017)  . levofloxacin (LEVAQUIN) 500 MG tablet Take 1 tablet (500 mg total) by mouth daily. (Patient not taking: Reported on 11/02/2017)  . POTASSIUM CHLORIDE PO Take 10 mcg by mouth daily.  . predniSONE (STERAPRED UNI-PAK 21 TAB) 10 MG (21) TBPK tablet 6 day taper - take by mouth as directed for 6 days (Patient not taking: Reported on 11/02/2017)  . [DISCONTINUED] gabapentin (NEURONTIN) 300 MG capsule Take 300 mg by mouth at bedtime.  . [DISCONTINUED] HYDROcodone-acetaminophen (NORCO) 5-325 MG tablet Take 1-2 tablets by mouth every 6 (six) hours as needed for moderate pain. MAXIMUM TOTAL ACETAMINOPHEN DOSE IS 4000 MG PER DAY (Patient not taking: Reported on 12/26/2017)   No facility-administered encounter medications on file as of 12/26/2017.     Surgical History: Past Surgical History:  Procedure Laterality Date  . CHOLECYSTECTOMY    . ENDOMETRIAL ABLATION  approx 2009  . ESOPHAGOGASTRODUODENOSCOPY    . REDUCTION MAMMAPLASTY Bilateral 2004  . TONSILLECTOMY    . TUBAL LIGATION    . ULNAR NERVE TRANSPOSITION Left 06/08/2016   Procedure: SUBCUTANEOUS ANTERIOR ULNAR NERVE TRANSPOSITION LEFT ELBOW;  Surgeon: Corky Mull, MD;  Location: [facility];  Service: Orthopedics;  Laterality: Left;    Medical History: Past Medical History:  Diagnosis Date  . Complication of anesthesia   . Headache    migraines, 1x/mo  . Hypertension   . Motion sickness    back seat cars  . PONV  (postoperative nausea and vomiting)   . Tachycardia   . Wears contact lenses     Family History: Family History  Problem Relation Age of Onset  . Breast cancer Paternal Grandmother     Social History   Socioeconomic History  . Marital status: Married    Spouse name: Not on file  . Number of children: Not on file  . Years of education: Not on file  . Highest education level: Not on file  Occupational History  . Not on file  Social Needs  . Financial resource strain: Not on file  . Food insecurity:    Worry: Not on file    Inability: Not on file  . Transportation needs:    Medical: Not on file    Non-medical: Not on file  Tobacco Use  . Smoking status: Never Smoker  . Smokeless tobacco: Never Used  Substance and Sexual Activity  . Alcohol use: Yes    Alcohol/week: 0.6 oz    Types: 1 Glasses of wine per week  . Drug use: No  . Sexual activity: Not on file  Lifestyle  . Physical activity:    Days per week: Not on file    Minutes per session: Not on file  . Stress: Not on file  Relationships  . Social connections:    Talks on phone: Not on file    Gets together: Not on file    Attends religious service: Not on file    Active member of club or organization: Not on file    Attends meetings of clubs or organizations: Not on file    Relationship status: Not on file  . Intimate partner violence:    Fear of current or ex partner: Not on file    Emotionally abused: Not on file    Physically abused: Not on file    Forced sexual activity: Not on file  Other Topics Concern  . Not on file  Social History Narrative  . Not on file      Review of Systems  Constitutional: Negative for activity change, chills, fatigue, fever and unexpected weight change.  HENT: Negative for congestion, postnasal drip, sneezing, sore throat and voice change.   Eyes: Negative.  Negative for redness.  Respiratory: Positive for cough. Negative for chest tightness, shortness of breath and  wheezing.   Cardiovascular: Negative for chest pain and palpitations.  Gastrointestinal: Negative for abdominal pain, constipation, diarrhea, nausea and vomiting.  Endocrine: Negative for cold intolerance, heat intolerance, polydipsia, polyphagia and polyuria.  Genitourinary: Negative.  Negative for dysuria and frequency.  Musculoskeletal: Positive for arthralgias and back pain. Negative for joint swelling and neck pain.       Bilateral   Skin: Negative for rash.  Allergic/Immunologic: Positive for environmental allergies.  Neurological: Positive for headaches. Negative for tremors and numbness.  Hematological: Negative.  Negative for adenopathy. Does not bruise/bleed easily.  Psychiatric/Behavioral: Positive  for dysphoric mood and sleep disturbance. Negative for behavioral problems (Depression) and suicidal ideas. The patient is not nervous/anxious.     Today's Vitals   12/26/17 0838  BP: (!) 126/51  Pulse: 89  Resp: 16  SpO2: 97%  Weight: 185 lb 12.8 oz (84.3 kg)  Height: 5' 3"  (1.6 m)    Physical Exam  Constitutional: She is oriented to person, place, and time. She appears well-developed and well-nourished. No distress.  HENT:  Head: Normocephalic and atraumatic.  Mouth/Throat: Oropharynx is clear and moist. No oropharyngeal exudate.  Eyes: Pupils are equal, round, and reactive to light. EOM are normal.  Neck: Normal range of motion. Neck supple. No JVD present. No tracheal deviation present. No thyromegaly present.  Cardiovascular: Normal rate, regular rhythm and normal heart sounds. Exam reveals no gallop and no friction rub.  No murmur heard. Pulmonary/Chest: Effort normal and breath sounds normal. No respiratory distress. She has no wheezes. She has no rales. She exhibits no tenderness.  Abdominal: Soft. Bowel sounds are normal. There is no tenderness.  Musculoskeletal: Normal range of motion.  Lymphadenopathy:    She has no cervical adenopathy.  Neurological: She is  alert and oriented to person, place, and time. No cranial nerve deficit.  Skin: Skin is warm and dry. She is not diaphoretic.  Psychiatric: She has a normal mood and affect. Her behavior is normal. Judgment and thought content normal.  Nursing note and vitals reviewed.  Assessment/Plan: 1. Essential (primary) hypertension Well controlled. Continue metoprolol as prescribed.  - metoprolol succinate (TOPROL-XL) 50 MG 24 hr tablet; Take 1 tablet (50 mg total) by mouth daily. Take with or immediately following a meal.  Dispense: 30 tablet; Refill: 5  2. Plantar fasciitis, bilateral Increased meloxicam 7.61m to twice daily. Advised her to ice and rest the foot when possible. Consider wearing supportive shoes with inserts  - meloxicam (MOBIC) 7.5 MG tablet; Take 1 tablet (7.5 mg total) by mouth 2 (two) times daily as needed for pain.  Dispense: 60 tablet; Refill: 2  3. Screening for breast cancer - MM DIGITAL SCREENING BILATERAL; Future  4. Migraine without aura and without status migrainosus, not intractable Well managed. Use abortive therapy as needed and as prescribed.    General Counseling: Skhya hallsunderstanding of the findings of todays visit and agrees with plan of treatment. I have discussed any further diagnostic evaluation that may be needed or ordered today. We also reviewed her medications today. she has been encouraged to call the office with any questions or concerns that should arise related to todays visit.  This patient was seen by HLeretha Pol FNP- C in Collaboration with Dr FLavera Guiseas a part of collaborative care agreement   Orders Placed This Encounter  Procedures  . MM DIGITAL SCREENING BILATERAL    Meds ordered this encounter  Medications  . meloxicam (MOBIC) 7.5 MG tablet    Sig: Take 1 tablet (7.5 mg total) by mouth 2 (two) times daily as needed for pain.    Dispense:  60 tablet    Refill:  2    Increase dose for now. Will use what she has until  runs out.    Order Specific Question:   Supervising Provider    Answer:   KLavera Guise[[[id number]] . metoprolol succinate (TOPROL-XL) 50 MG 24 hr tablet    Sig: Take 1 tablet (50 mg total) by mouth daily. Take with or immediately following a meal.    Dispense:  30 tablet    Refill:  5    Order Specific Question:   Supervising Provider    Answer:   Lavera Guise [[id number]]    Time spent: 17 Minutes     Dr Lavera Guise Internal medicine

## 2018-01-17 ENCOUNTER — Telehealth: Payer: Self-pay | Admitting: Nurse Practitioner

## 2018-01-17 NOTE — Telephone Encounter (Signed)
Switched mobic 7.5 to mobic 15 once daily

## 2018-02-14 ENCOUNTER — Other Ambulatory Visit: Payer: Self-pay | Admitting: Nurse Practitioner

## 2018-02-14 DIAGNOSIS — F411 Generalized anxiety disorder: Secondary | ICD-10-CM

## 2018-02-14 MED ORDER — ALPRAZOLAM 0.5 MG PO TABS: 0.5000 mg | ORAL_TABLET | Freq: Every day | ORAL | 3 refills | Status: DC | PRN

## 2018-02-14 NOTE — Progress Notes (Signed)
Renewed alprazolam 0.69m daily as needed per pharmacy request.

## 2018-02-16 ENCOUNTER — Encounter: Payer: Self-pay | Admitting: Nurse Practitioner

## 2018-02-16 ENCOUNTER — Ambulatory Visit: Payer: BC Managed Care – PPO | Admitting: Nurse Practitioner

## 2018-02-16 VITALS — BP 135/80 | HR 91 | Resp 16 | Ht 63.0 in | Wt 186.8 lb

## 2018-02-16 DIAGNOSIS — R5383 Other fatigue: Secondary | ICD-10-CM | POA: Diagnosis not present

## 2018-02-16 DIAGNOSIS — F411 Generalized anxiety disorder: Secondary | ICD-10-CM

## 2018-02-16 MED ORDER — SERTRALINE HCL 100 MG PO TABS: 100.0000 mg | ORAL_TABLET | Freq: Every day | ORAL | 3 refills | Status: DC

## 2018-02-16 NOTE — Progress Notes (Signed)
[facility] [place], [place] [address]  Internal MEDICINE  Office Visit Note  Patient Name: Yvette Humphrey  [id number]  [id number]  Date of Service: 03/14/2018  Chief Complaint  Patient presents with  . panick attack    happened wednesday on the job and pt stated first time ever happening    Patient had severe panic attack at work. Situation made her to feel as though she wee being verbally attacked and bombarded by coworkers. She started to hyperventilate. She then got short of breath and had palpitations. She got very lightheaded. Manager got very worried about her and called EMS. ECG was done and was normal. She did refuse transport to the hospital. Knew she was having panic reation to the situation. She was asked to discuss this with provider and have situation documented.       Current Medication: Outpatient Encounter Medications as of 02/16/2018  Medication Sig  . ALPRAZolam (XANAX) 0.5 MG tablet Take 1 tablet (0.5 mg total) by mouth daily as needed for anxiety.  Marland Kitchen BIOTIN PO Take by mouth daily.  . Butalbital-APAP-Caffeine (FIORICET) 50-300-40 MG CAPS Take 1 capsule by mouth as needed. migraines  . cetirizine (ZYRTEC) 10 MG tablet Take 10 mg by mouth daily.  . chlorpheniramine-HYDROcodone (TUSSIONEX PENNKINETIC ER) 10-8 MG/5ML SUER Take 5 mLs by mouth every 12 (twelve) hours as needed for cough.  . clarithromycin (BIAXIN) 500 MG tablet Take 1 tablet (500 mg total) by mouth 2 (two) times daily.  . cyanocobalamin ([employer] VITAMIN B12) 1000 MCG tablet Take by mouth.  . cyanocobalamin 500 MCG tablet Take 500 mcg by mouth daily.  Marland Kitchen levofloxacin (LEVAQUIN) 500 MG tablet Take 1 tablet (500 mg total) by mouth daily.  Marland Kitchen Lysine 1000 MG TABS Take by mouth daily as needed.  . Magnesium 250 MG TABS Take by mouth daily.  . meloxicam (MOBIC) 15 MG tablet Take 15 mg by mouth daily.  . metoprolol succinate (TOPROL-XL) 50 MG 24 hr tablet Take 1 tablet (50 mg total) by  mouth daily. Take with or immediately following a meal.  . Multiple Vitamins-Minerals (MULTIVITAMIN WOMEN PO) Take by mouth daily.  Marland Kitchen POTASSIUM CHLORIDE PO Take 10 mcg by mouth daily.  . predniSONE (STERAPRED UNI-PAK 21 TAB) 10 MG (21) TBPK tablet 6 day taper - take by mouth as directed for 6 days  . sertraline (ZOLOFT) 100 MG tablet Take 1 tablet (100 mg total) by mouth daily.  . [DISCONTINUED] fluconazole (DIFLUCAN) 150 MG tablet Take 1 tab po once and then repeat again if symptoms persists  . [DISCONTINUED] sertraline (ZOLOFT) 50 MG tablet Take 1.5 tablets (75 mg total) by mouth daily.  . [DISCONTINUED] zolpidem (AMBIEN) 10 MG tablet Take 1 tablet (10 mg total) by mouth at bedtime as needed for sleep.   No facility-administered encounter medications on file as of 02/16/2018.     Surgical History: Past Surgical History:  Procedure Laterality Date  . CHOLECYSTECTOMY    . ENDOMETRIAL ABLATION     approx 2009  . ESOPHAGOGASTRODUODENOSCOPY    . REDUCTION MAMMAPLASTY Bilateral 2004  . TONSILLECTOMY    . TUBAL LIGATION    . ULNAR NERVE TRANSPOSITION Left 06/08/2016   Procedure: SUBCUTANEOUS ANTERIOR ULNAR NERVE TRANSPOSITION LEFT ELBOW;  Surgeon: Corky Mull, MD;  Location: [facility];  Service: Orthopedics;  Laterality: Left;    Medical History: Past Medical History:  Diagnosis Date  . Complication of anesthesia   . Headache    migraines,  1x/mo  . Hypertension   . Motion sickness    back seat cars  . PONV (postoperative nausea and vomiting)   . Tachycardia   . Wears contact lenses     Family History: Family History  Problem Relation Age of Onset  . Breast cancer Paternal Grandmother     Social History   Socioeconomic History  . Marital status: Married    Spouse name: Not on file  . Number of children: Not on file  . Years of education: Not on file  . Highest education level: Not on file  Occupational History  . Not on file  Social Needs  . Financial  resource strain: Not on file  . Food insecurity:    Worry: Not on file    Inability: Not on file  . Transportation needs:    Medical: Not on file    Non-medical: Not on file  Tobacco Use  . Smoking status: Never Smoker  . Smokeless tobacco: Never Used  Substance and Sexual Activity  . Alcohol use: Yes    Alcohol/week: 0.6 oz    Types: 1 Glasses of wine per week  . Drug use: No  . Sexual activity: Not on file  Lifestyle  . Physical activity:    Days per week: Not on file    Minutes per session: Not on file  . Stress: Not on file  Relationships  . Social connections:    Talks on phone: Not on file    Gets together: Not on file    Attends religious service: Not on file    Active member of club or organization: Not on file    Attends meetings of clubs or organizations: Not on file    Relationship status: Not on file  . Intimate partner violence:    Fear of current or ex partner: Not on file    Emotionally abused: Not on file    Physically abused: Not on file    Forced sexual activity: Not on file  Other Topics Concern  . Not on file  Social History Narrative  . Not on file      Review of Systems  Constitutional: Positive for fatigue. Negative for activity change, chills, fever and unexpected weight change.  HENT: Negative for congestion, postnasal drip, sneezing, sore throat and voice change.   Eyes: Negative.  Negative for redness.  Respiratory: Positive for chest tightness and shortness of breath. Negative for cough and wheezing.   Cardiovascular: Negative for chest pain and palpitations.  Gastrointestinal: Negative for abdominal pain, constipation, diarrhea, nausea and vomiting.  Endocrine: Negative for cold intolerance, heat intolerance, polydipsia, polyphagia and polyuria.  Genitourinary: Negative.  Negative for dysuria and frequency.  Musculoskeletal: Positive for arthralgias and back pain. Negative for joint swelling and neck pain.       Bilateral   Skin:  Negative for rash.  Allergic/Immunologic: Positive for environmental allergies.  Neurological: Positive for headaches. Negative for tremors and numbness.  Hematological: Negative.  Negative for adenopathy. Does not bruise/bleed easily.  Psychiatric/Behavioral: Positive for dysphoric mood and sleep disturbance. Negative for behavioral problems (Depression) and suicidal ideas. The patient is not nervous/anxious.        Panic attack at work .    Today's Vitals   02/16/18 1429  BP: 135/80  Pulse: 91  Resp: 16  SpO2: 97%  Weight: 186 lb 12.8 oz (84.7 kg)  Height: 5' 3"  (1.6 m)    Physical Exam  Constitutional: She is oriented to person, place,  and time. She appears well-developed and well-nourished. No distress.  HENT:  Head: Normocephalic and atraumatic.  Nose: Nose normal.  Mouth/Throat: Oropharynx is clear and moist. No oropharyngeal exudate.  Eyes: Pupils are equal, round, and reactive to light. Conjunctivae and EOM are normal.  Neck: Normal range of motion. Neck supple. No JVD present. No tracheal deviation present. No thyromegaly present.  Cardiovascular: Normal rate, regular rhythm and normal heart sounds. Exam reveals no gallop and no friction rub.  No murmur heard. Pulmonary/Chest: Effort normal and breath sounds normal. No respiratory distress. She has no wheezes. She has no rales. She exhibits no tenderness.  Abdominal: Soft. Bowel sounds are normal. There is no tenderness.  Musculoskeletal: Normal range of motion.  Lymphadenopathy:    She has no cervical adenopathy.  Neurological: She is alert and oriented to person, place, and time. No cranial nerve deficit.  Skin: Skin is warm and dry. She is not diaphoretic.  Psychiatric: Her speech is normal and behavior is normal. Judgment and thought content normal. Her mood appears anxious. Cognition and memory are normal. She exhibits a depressed mood.  Nursing note and vitals reviewed.  Assessment/Plan:  1. Fatigue, unspecified  type Likely due to increased anxiety/depression. Adjust medication and monitor closely.  2. GAD (generalized anxiety disorder) Increase sertaline to 123m daily.  - sertraline (ZOLOFT) 100 MG tablet; Take 1 tablet (100 mg total) by mouth daily.  Dispense: 30 tablet; Refill: 3  General Counseling: Svanshika jastrzebskiunderstanding of the findings of todays visit and agrees with plan of treatment. I have discussed any further diagnostic evaluation that may be needed or ordered today. We also reviewed her medications today. she has been encouraged to call the office with any questions or concerns that should arise related to todays visit.  This patient was seen by HMasonwith Dr FLavera Guiseas a part of collaborative care agreement    Meds ordered this encounter  Medications  . sertraline (ZOLOFT) 100 MG tablet    Sig: Take 1 tablet (100 mg total) by mouth daily.    Dispense:  30 tablet    Refill:  3    Please note increased dose.    Order Specific Question:   Supervising Provider    Answer:   KLavera Guise[[[id number]   Time spent: 152Minutes      Dr FLavera GuiseInternal medicine

## 2018-02-22 ENCOUNTER — Other Ambulatory Visit: Payer: Self-pay

## 2018-02-22 DIAGNOSIS — F5101 Primary insomnia: Secondary | ICD-10-CM

## 2018-02-22 MED ORDER — ZOLPIDEM TARTRATE 10 MG PO TABS: 10.0000 mg | ORAL_TABLET | Freq: Every evening | ORAL | 2 refills | Status: DC | PRN

## 2018-03-07 ENCOUNTER — Other Ambulatory Visit: Payer: Self-pay

## 2018-03-07 DIAGNOSIS — B379 Candidiasis, unspecified: Secondary | ICD-10-CM

## 2018-03-07 MED ORDER — FLUCONAZOLE 150 MG PO TABS: ORAL_TABLET | ORAL | 0 refills | Status: DC

## 2018-03-14 DIAGNOSIS — R5383 Other fatigue: Secondary | ICD-10-CM | POA: Insufficient documentation

## 2018-04-25 ENCOUNTER — Other Ambulatory Visit: Payer: Self-pay | Admitting: Internal Medicine

## 2018-04-25 DIAGNOSIS — B379 Candidiasis, unspecified: Secondary | ICD-10-CM

## 2018-04-30 ENCOUNTER — Encounter: Payer: Self-pay | Admitting: Nurse Practitioner

## 2018-05-03 ENCOUNTER — Ambulatory Visit: Payer: BC Managed Care – PPO | Admitting: Nurse Practitioner

## 2018-05-03 ENCOUNTER — Encounter: Payer: Self-pay | Admitting: Nurse Practitioner

## 2018-05-03 VITALS — BP 135/73 | HR 89 | Temp 97.2°F | Resp 16 | Ht 63.0 in | Wt 186.8 lb

## 2018-05-03 DIAGNOSIS — J0111 Acute recurrent frontal sinusitis: Secondary | ICD-10-CM | POA: Diagnosis not present

## 2018-05-03 DIAGNOSIS — R5383 Other fatigue: Secondary | ICD-10-CM | POA: Diagnosis not present

## 2018-05-03 DIAGNOSIS — R300 Dysuria: Secondary | ICD-10-CM | POA: Insufficient documentation

## 2018-05-03 DIAGNOSIS — Z0001 Encounter for general adult medical examination with abnormal findings: Secondary | ICD-10-CM

## 2018-05-03 DIAGNOSIS — I10 Essential (primary) hypertension: Secondary | ICD-10-CM

## 2018-05-03 DIAGNOSIS — F411 Generalized anxiety disorder: Secondary | ICD-10-CM

## 2018-05-03 DIAGNOSIS — E559 Vitamin D deficiency, unspecified: Secondary | ICD-10-CM | POA: Insufficient documentation

## 2018-05-03 MED ORDER — CLARITHROMYCIN 500 MG PO TABS: 500.0000 mg | ORAL_TABLET | Freq: Two times a day (BID) | ORAL | 0 refills | Status: DC

## 2018-05-03 NOTE — Progress Notes (Signed)
[facility] [place], [place] [address]  Internal MEDICINE  Office Visit Note  Patient Name: Yvette Humphrey  [id number]  [id number]  Date of Service: 05/03/2018   Pt is here for routine health maintenance examination   Chief Complaint  Patient presents with  . Annual Exam    4 month physical  . Sinusitis    started about a week ago will not go away, possible fever on yesterday. no chills     Sinusitis  This is a new problem. The current episode started in the past 7 days. The problem is unchanged. The maximum temperature recorded prior to her arrival was 100.4 - 100.9 F. The fever has been present for less than 1 day. Her pain is at a severity of 3/10. The pain is mild. Associated symptoms include congestion, coughing, ear pain, headaches, a hoarse voice, sneezing and a sore throat. Pertinent negatives include no chills. Past treatments include oral decongestants and acetaminophen. The treatment provided mild relief.    Current Medication: Outpatient Encounter Medications as of 05/03/2018  Medication Sig  . ALPRAZolam (XANAX) 0.5 MG tablet Take 1 tablet (0.5 mg total) by mouth daily as needed for anxiety.  Marland Kitchen BIOTIN PO Take by mouth daily.  . Butalbital-APAP-Caffeine (FIORICET) 50-300-40 MG CAPS Take 1 capsule by mouth as needed. migraines  . cetirizine (ZYRTEC) 10 MG tablet Take 10 mg by mouth daily.  . chlorpheniramine-HYDROcodone (TUSSIONEX PENNKINETIC ER) 10-8 MG/5ML SUER Take 5 mLs by mouth every 12 (twelve) hours as needed for cough.  . clarithromycin (BIAXIN) 500 MG tablet Take 1 tablet (500 mg total) by mouth 2 (two) times daily.  . cyanocobalamin ([employer]S VITAMIN B12) 1000 MCG tablet Take by mouth.  . cyanocobalamin 500 MCG tablet Take 500 mcg by mouth daily.  . fluconazole (DIFLUCAN) 150 MG tablet TAKE 1 TABLET BY MOUTH 1 TIME. REPEAT AGAIN IF SYMPTOMS PERSISTS  . levofloxacin (LEVAQUIN) 500 MG tablet Take 1 tablet (500 mg total) by mouth daily.  Marland Kitchen  Lysine 1000 MG TABS Take by mouth daily as needed.  . Magnesium 250 MG TABS Take by mouth daily.  . meloxicam (MOBIC) 15 MG tablet Take 15 mg by mouth daily.  . metoprolol succinate (TOPROL-XL) 50 MG 24 hr tablet Take 1 tablet (50 mg total) by mouth daily. Take with or immediately following a meal.  . Multiple Vitamins-Minerals (MULTIVITAMIN WOMEN PO) Take by mouth daily.  Marland Kitchen POTASSIUM CHLORIDE PO Take 10 mcg by mouth daily.  . sertraline (ZOLOFT) 100 MG tablet Take 1 tablet (100 mg total) by mouth daily.  Marland Kitchen zolpidem (AMBIEN) 10 MG tablet Take 1 tablet (10 mg total) by mouth at bedtime as needed for sleep.  . [DISCONTINUED] clarithromycin (BIAXIN) 500 MG tablet Take 1 tablet (500 mg total) by mouth 2 (two) times daily.  . predniSONE (STERAPRED UNI-PAK 21 TAB) 10 MG (21) TBPK tablet 6 day taper - take by mouth as directed for 6 days (Patient not taking: Reported on 05/03/2018)   No facility-administered encounter medications on file as of 05/03/2018.     Surgical History: Past Surgical History:  Procedure Laterality Date  . CHOLECYSTECTOMY    . ENDOMETRIAL ABLATION     approx 2009  . ESOPHAGOGASTRODUODENOSCOPY    . REDUCTION MAMMAPLASTY Bilateral 2004  . TONSILLECTOMY    . TUBAL LIGATION    . ULNAR NERVE TRANSPOSITION Left 06/08/2016   Procedure: SUBCUTANEOUS ANTERIOR ULNAR NERVE TRANSPOSITION LEFT ELBOW;  Surgeon: Corky Mull, MD;  Location:  [facility];  Service: Orthopedics;  Laterality: Left;    Medical History: Past Medical History:  Diagnosis Date  . Complication of anesthesia   . Headache    migraines, 1x/mo  . Hypertension   . Motion sickness    back seat cars  . PONV (postoperative nausea and vomiting)   . Tachycardia   . Wears contact lenses     Family History: Family History  Problem Relation Age of Onset  . Breast cancer Paternal Grandmother       Review of Systems  Constitutional: Positive for fatigue and fever. Negative for activity change and  chills.  HENT: Positive for congestion, ear pain, hoarse voice, postnasal drip, rhinorrhea, sinus pain, sneezing, sore throat and voice change.   Eyes: Negative.   Respiratory: Positive for cough. Negative for wheezing.   Cardiovascular: Negative for chest pain and palpitations.  Gastrointestinal: Negative for constipation, diarrhea, nausea and vomiting.  Endocrine: Negative for cold intolerance, heat intolerance, polydipsia, polyphagia and polyuria.  Genitourinary: Negative.   Musculoskeletal: Negative for arthralgias and myalgias.  Skin: Negative for rash.  Allergic/Immunologic: Positive for environmental allergies.  Neurological: Positive for headaches. Negative for dizziness.  Hematological: Negative for adenopathy.  Psychiatric/Behavioral: Positive for dysphoric mood. The patient is nervous/anxious.        Symptoms are all well managed on current medications. Has had less anxiety and no further panic type reactions since her last visit .   Today's Vitals   05/03/18 0843  BP: 135/73  Pulse: 89  Resp: 16  Temp: (!) 97.2 F (36.2 C)  SpO2: 97%  Weight: 186 lb 12.8 oz (84.7 kg)  Height: 5' 3"  (1.6 m)      Physical Exam  Constitutional: She is oriented to person, place, and time. She appears well-developed and well-nourished. No distress.  HENT:  Head: Normocephalic and atraumatic.  Right Ear: External ear and ear canal normal.  Left Ear: Tympanic membrane is erythematous and bulging.  Nose: Nose normal.  Mouth/Throat: Posterior oropharyngeal erythema present. No oropharyngeal exudate.  Eyes: Pupils are equal, round, and reactive to light. Conjunctivae and EOM are normal.  Neck: Normal range of motion. Neck supple. No JVD present. No tracheal deviation present. No thyromegaly present.  Cardiovascular: Normal rate, regular rhythm, normal heart sounds and intact distal pulses. Exam reveals no gallop and no friction rub.  No murmur heard. Pulmonary/Chest: Effort normal and  breath sounds normal. No respiratory distress. She has no wheezes. She has no rales. She exhibits no tenderness. Right breast exhibits no inverted nipple, no mass, no nipple discharge, no skin change and no tenderness. Left breast exhibits no inverted nipple, no mass, no nipple discharge, no skin change and no tenderness.  Fibrocystic breast tissue bilaterally.  Abdominal: Soft. Bowel sounds are normal. There is no tenderness.  Musculoskeletal: Normal range of motion.  Lymphadenopathy:    She has no cervical adenopathy.  Neurological: She is alert and oriented to person, place, and time. No cranial nerve deficit.  Skin: Skin is warm and dry. Capillary refill takes less than 2 seconds. She is not diaphoretic.  Psychiatric: Her speech is normal and behavior is normal. Judgment and thought content normal. Her mood appears anxious. Cognition and memory are normal. She exhibits a depressed mood.  Nursing note and vitals reviewed.  Assessment/Plan: 1. Encounter for general adult medical examination with abnormal findings Annual health maintenance exam today - CBC with Differential/Platelet - Comprehensive metabolic panel - T4, free - TSH  2. Acute recurrent frontal  sinusitis Start biaxin 574m twice daily for 10 days. Rest and increase fluids. Take OTC medication to help alleviate symptoms.  - clarithromycin (BIAXIN) 500 MG tablet; Take 1 tablet (500 mg total) by mouth 2 (two) times daily.  Dispense: 20 tablet; Refill: 0  3. Fatigue, unspecified type - T4, free - TSH  4. Essential (primary) hypertension Stable. Continue bp medication as prescribed.  - Lipid panel  5. GAD (generalized anxiety disorder) Doing well. Continue sertraline as prescribed. Use alprazolam as needed and as prescribed.   6. Vitamin D deficiency - Vitamin D 1,25 dihydroxy  7. Dysuria - UA/M w/rflx Culture, Routine  General Counseling: Snikayla madarisunderstanding of the findings of todays visit and agrees with  plan of treatment. I have discussed any further diagnostic evaluation that may be needed or ordered today. We also reviewed her medications today. she has been encouraged to call the office with any questions or concerns that should arise related to todays visit.    Counseling:  Rest and increase fluids. Continue using OTC medication to control symptoms.   This patient was seen by HLeretha PolFNP Collaboration with Dr FLavera Guiseas a part of collaborative care agreement  Orders Placed This Encounter  Procedures  . UA/M w/rflx Culture, Routine  . CBC with Differential/Platelet  . Comprehensive metabolic panel  . T4, free  . TSH  . Lipid panel  . Vitamin D 1,25 dihydroxy    Meds ordered this encounter  Medications  . clarithromycin (BIAXIN) 500 MG tablet    Sig: Take 1 tablet (500 mg total) by mouth 2 (two) times daily.    Dispense:  20 tablet    Refill:  0    Order Specific Question:   Supervising Provider    Answer:   KLavera Guise[[[id number]   Time spent: 3[place] MD  Internal Medicine

## 2018-05-05 LAB — UA/M W/RFLX CULTURE, ROUTINE
Bilirubin, UA: NEGATIVE
Glucose, UA: NEGATIVE
Ketones, UA: NEGATIVE
LEUKOCYTES UA: NEGATIVE
Nitrite, UA: NEGATIVE
PH UA: 7 (ref 5.0–7.5)
Protein, UA: NEGATIVE
RBC, UA: NEGATIVE
Specific Gravity, UA: 1.016 (ref 1.005–1.030)
UUROB: 0.2 mg/dL (ref 0.2–1.0)

## 2018-05-05 LAB — MICROSCOPIC EXAMINATION
BACTERIA UA: NONE SEEN
CASTS: NONE SEEN /LPF
RBC MICROSCOPIC, UA: NONE SEEN /HPF (ref 0–2)

## 2018-05-28 ENCOUNTER — Other Ambulatory Visit: Payer: Self-pay

## 2018-05-28 MED ORDER — MELOXICAM 15 MG PO TABS: 15.0000 mg | ORAL_TABLET | Freq: Every day | ORAL | 5 refills | Status: DC

## 2018-05-30 ENCOUNTER — Other Ambulatory Visit: Payer: Self-pay | Admitting: Nurse Practitioner

## 2018-05-30 DIAGNOSIS — F5101 Primary insomnia: Secondary | ICD-10-CM

## 2018-05-30 MED ORDER — ZOLPIDEM TARTRATE 10 MG PO TABS: 10.0000 mg | ORAL_TABLET | Freq: Every evening | ORAL | 2 refills | Status: DC | PRN

## 2018-05-30 NOTE — Progress Notes (Signed)
Refilled patient's prescription for ambien per pharmacy request.

## 2018-06-05 ENCOUNTER — Other Ambulatory Visit: Payer: Self-pay | Admitting: Nurse Practitioner

## 2018-06-05 DIAGNOSIS — G43009 Migraine without aura, not intractable, without status migrainosus: Secondary | ICD-10-CM

## 2018-06-05 MED ORDER — BUTALBITAL-APAP-CAFFEINE 50-300-40 MG PO CAPS: 1.0000 | ORAL_CAPSULE | ORAL | 3 refills | Status: DC | PRN

## 2018-06-05 NOTE — Telephone Encounter (Signed)
Renewed rx for butalbital/APAP/caffeine per pharmacy request.

## 2018-06-05 NOTE — Telephone Encounter (Signed)
Last 05/30/18

## 2018-06-05 NOTE — Progress Notes (Signed)
Renewed rx for butalbital/APAP/caffeine per pharmacy request.

## 2018-07-02 ENCOUNTER — Telehealth: Payer: Self-pay

## 2018-07-02 NOTE — Telephone Encounter (Signed)
lmom to pt need appt for xanax refills

## 2018-07-11 ENCOUNTER — Telehealth: Payer: Self-pay

## 2018-07-11 DIAGNOSIS — F411 Generalized anxiety disorder: Secondary | ICD-10-CM

## 2018-07-11 MED ORDER — SERTRALINE HCL 100 MG PO TABS: 100.0000 mg | ORAL_TABLET | Freq: Every day | ORAL | 3 refills | Status: DC

## 2018-07-11 NOTE — Telephone Encounter (Signed)
send pres

## 2018-07-12 ENCOUNTER — Ambulatory Visit: Payer: BC Managed Care – PPO | Admitting: Nurse Practitioner

## 2018-07-12 ENCOUNTER — Encounter: Payer: Self-pay | Admitting: Nurse Practitioner

## 2018-07-12 DIAGNOSIS — R05 Cough: Secondary | ICD-10-CM

## 2018-07-12 DIAGNOSIS — R059 Cough, unspecified: Secondary | ICD-10-CM

## 2018-07-12 DIAGNOSIS — J0111 Acute recurrent frontal sinusitis: Secondary | ICD-10-CM

## 2018-07-12 DIAGNOSIS — B379 Candidiasis, unspecified: Secondary | ICD-10-CM | POA: Diagnosis not present

## 2018-07-12 MED ORDER — HYDROCOD POLST-CPM POLST ER 10-8 MG/5ML PO SUER: 5.0000 mL | Freq: Two times a day (BID) | ORAL | 0 refills | Status: DC | PRN

## 2018-07-12 MED ORDER — CLARITHROMYCIN 500 MG PO TABS: 500.0000 mg | ORAL_TABLET | Freq: Two times a day (BID) | ORAL | 0 refills | Status: DC

## 2018-07-12 MED ORDER — FLUCONAZOLE 150 MG PO TABS: ORAL_TABLET | ORAL | 0 refills | Status: DC

## 2018-07-12 NOTE — Progress Notes (Signed)
[facility] [place], [place] [address]  Internal MEDICINE  Office Visit Note  Patient Name: Yvette Humphrey  [id number]  [id number]  Date of Service: 07/12/2018  Chief Complaint  Patient presents with  . Sinusitis    pt has had symptoms since tuesday, hard to breathe, coughing, possible fever  feeling flushed  . Fever  . Cough  . Nasal Congestion     The patient is here for sick visit. She has low-grade fever, congestion, headache, sore throat, and bilateral ear pain. Symptoms started on Tuesday and have gradually become worse. OTC medications have not helped. Snoring loudly at night due to congestion. Denies nausea or vomiting.   Pt is here for a sick visit.     Current Medication:  Outpatient Encounter Medications as of 07/12/2018  Medication Sig  . ALPRAZolam (XANAX) 0.5 MG tablet Take 1 tablet (0.5 mg total) by mouth daily as needed for anxiety.  Marland Kitchen BIOTIN PO Take by mouth daily.  . Butalbital-APAP-Caffeine (FIORICET) 50-300-40 MG CAPS Take 1 capsule by mouth as needed. migraines  . cetirizine (ZYRTEC) 10 MG tablet Take 10 mg by mouth daily.  . chlorpheniramine-HYDROcodone (TUSSIONEX PENNKINETIC ER) 10-8 MG/5ML SUER Take 5 mLs by mouth every 12 (twelve) hours as needed for cough.  . clarithromycin (BIAXIN) 500 MG tablet Take 1 tablet (500 mg total) by mouth 2 (two) times daily.  . cyanocobalamin ([employer] VITAMIN B12) 1000 MCG tablet Take by mouth.  . cyanocobalamin 500 MCG tablet Take 500 mcg by mouth daily.  . fluconazole (DIFLUCAN) 150 MG tablet TAKE 1 TABLET BY MOUTH 1 TIME. REPEAT AGAIN IF SYMPTOMS PERSISTS  . Lysine 1000 MG TABS Take by mouth daily as needed.  . Magnesium 250 MG TABS Take by mouth daily.  . meloxicam (MOBIC) 15 MG tablet Take 1 tablet (15 mg total) by mouth daily.  . metoprolol succinate (TOPROL-XL) 50 MG 24 hr tablet Take 1 tablet (50 mg total) by mouth daily. Take with or immediately following a meal.  . Multiple  Vitamins-Minerals (MULTIVITAMIN WOMEN PO) Take by mouth daily.  Marland Kitchen POTASSIUM CHLORIDE PO Take 10 mcg by mouth daily.  . sertraline (ZOLOFT) 100 MG tablet Take 1 tablet (100 mg total) by mouth daily.  Marland Kitchen zolpidem (AMBIEN) 10 MG tablet Take 1 tablet (10 mg total) by mouth at bedtime as needed for sleep.  . [DISCONTINUED] chlorpheniramine-HYDROcodone (TUSSIONEX PENNKINETIC ER) 10-8 MG/5ML SUER Take 5 mLs by mouth every 12 (twelve) hours as needed for cough.  . [DISCONTINUED] clarithromycin (BIAXIN) 500 MG tablet Take 1 tablet (500 mg total) by mouth 2 (two) times daily.  . [DISCONTINUED] fluconazole (DIFLUCAN) 150 MG tablet TAKE 1 TABLET BY MOUTH 1 TIME. REPEAT AGAIN IF SYMPTOMS PERSISTS  . [DISCONTINUED] levofloxacin (LEVAQUIN) 500 MG tablet Take 1 tablet (500 mg total) by mouth daily.  . predniSONE (STERAPRED UNI-PAK 21 TAB) 10 MG (21) TBPK tablet 6 day taper - take by mouth as directed for 6 days (Patient not taking: Reported on 05/03/2018)   No facility-administered encounter medications on file as of 07/12/2018.       Medical History: Past Medical History:  Diagnosis Date  . Complication of anesthesia   . Headache    migraines, 1x/mo  . Hypertension   . Motion sickness    back seat cars  . PONV (postoperative nausea and vomiting)   . Tachycardia   . Wears contact lenses      Today's Vitals   07/12/18 1427  BP:  127/81  Pulse: 84  Resp: 16  Temp: 99.1 F (37.3 C)  SpO2: 93%  Weight: 180 lb (81.6 kg)  Height: 5' 3"  (1.6 m)    Review of Systems  Constitutional: Positive for chills, fatigue and fever.  HENT: Positive for congestion, ear pain, postnasal drip, rhinorrhea, sinus pain, sore throat and voice change.   Respiratory: Positive for cough. Negative for wheezing.   Cardiovascular: Negative for chest pain and palpitations.  Gastrointestinal: Negative for nausea and vomiting.  Musculoskeletal: Positive for myalgias.  Skin: Negative.   Allergic/Immunologic: Positive for  environmental allergies.  Neurological: Positive for headaches.    Physical Exam  Constitutional: She is oriented to person, place, and time. She appears well-developed and well-nourished. No distress.  HENT:  Head: Normocephalic and atraumatic.  Right Ear: Tympanic membrane is bulging.  Left Ear: Tympanic membrane is bulging.  Nose: Rhinorrhea present. Right sinus exhibits frontal sinus tenderness. Left sinus exhibits frontal sinus tenderness.  Mouth/Throat: Posterior oropharyngeal edema and posterior oropharyngeal erythema present. No oropharyngeal exudate.  Eyes: Pupils are equal, round, and reactive to light. EOM are normal.  Neck: Normal range of motion. Neck supple. No JVD present. No tracheal deviation present. No thyromegaly present.  Cardiovascular: Normal rate, regular rhythm and normal heart sounds. Exam reveals no gallop and no friction rub.  No murmur heard. Pulmonary/Chest: Effort normal and breath sounds normal. No respiratory distress. She has no wheezes. She has no rales. She exhibits no tenderness.  Congested, non-productive cough present.   Musculoskeletal: Normal range of motion.  Lymphadenopathy:    She has cervical adenopathy.  Neurological: She is alert and oriented to person, place, and time. No cranial nerve deficit.  Skin: Skin is warm and dry. She is not diaphoretic.  Psychiatric: She has a normal mood and affect. Her behavior is normal. Judgment and thought content normal.  Nursing note and vitals reviewed.   Assessment/Plan: 1. Acute recurrent frontal sinusitis Start biazin 535m bid for 10 days. Rest and increase fluids and take OTC medication as needed to alleviate symptoms.  - clarithromycin (BIAXIN) 500 MG tablet; Take 1 tablet (500 mg total) by mouth 2 (two) times daily.  Dispense: 20 tablet; Refill: 0  2. Candidiasis Diflucan as needed if yeast infection develops from antibiotics  - fluconazole (DIFLUCAN) 150 MG tablet; TAKE 1 TABLET BY MOUTH 1  TIME. REPEAT AGAIN IF SYMPTOMS PERSISTS  Dispense: 3 tablet; Refill: 0  3. Cough tussionex cough suppressant - take up to twice daily as needed for cough. Recommend use at night only as this may cause dizziness or drowsiness  - chlorpheniramine-HYDROcodone (TUSSIONEX PENNKINETIC ER) 10-8 MG/5ML SUER; Take 5 mLs by mouth every 12 (twelve) hours as needed for cough.  Dispense: 115 mL; Refill: 0  General Counseling: Sanalisa sleddunderstanding of the findings of todays visit and agrees with plan of treatment. I have discussed any further diagnostic evaluation that may be needed or ordered today. We also reviewed her medications today. she has been encouraged to call the office with any questions or concerns that should arise related to todays visit.    Counseling:  Rest and increase fluids. Continue using OTC medication to control symptoms.   This patient was seen by [place]ou Cor[place]ith Dr FLavera Guiseas a part of collaborative care agreement  Meds ordered this encounter  Medications  . clarithromycin (BIAXIN) 500 MG tablet    Sig: Take 1 tablet (500 mg total) by mouth 2 (two) times daily.  Dispense:  20 tablet    Refill:  0    Order Specific Question:   Supervising Provider    Answer:   Lavera Guise [[id number]]  . fluconazole (DIFLUCAN) 150 MG tablet    Sig: TAKE 1 TABLET BY MOUTH 1 TIME. REPEAT AGAIN IF SYMPTOMS PERSISTS    Dispense:  3 tablet    Refill:  0    Order Specific Question:   Supervising Provider    Answer:   Lavera Guise [[id number]]  . chlorpheniramine-HYDROcodone (TUSSIONEX PENNKINETIC ER) 10-8 MG/5ML SUER    Sig: Take 5 mLs by mouth every 12 (twelve) hours as needed for cough.    Dispense:  115 mL    Refill:  0    Order Specific Question:   Supervising Provider    Answer:   Lavera Guise [[id number]]    Time spent: 15 Minutes

## 2018-07-26 ENCOUNTER — Other Ambulatory Visit: Payer: Self-pay

## 2018-07-26 DIAGNOSIS — I10 Essential (primary) hypertension: Secondary | ICD-10-CM

## 2018-07-26 MED ORDER — METOPROLOL SUCCINATE ER 50 MG PO TB24: 50.0000 mg | ORAL_TABLET | Freq: Every day | ORAL | 3 refills | Status: DC

## 2018-08-31 ENCOUNTER — Ambulatory Visit: Payer: BC Managed Care – PPO | Admitting: Nurse Practitioner

## 2018-08-31 ENCOUNTER — Encounter: Payer: Self-pay | Admitting: Nurse Practitioner

## 2018-08-31 VITALS — BP 134/84 | HR 99 | Resp 16 | Ht 63.0 in | Wt 189.0 lb

## 2018-08-31 DIAGNOSIS — I10 Essential (primary) hypertension: Secondary | ICD-10-CM | POA: Diagnosis not present

## 2018-08-31 DIAGNOSIS — F5101 Primary insomnia: Secondary | ICD-10-CM | POA: Diagnosis not present

## 2018-08-31 DIAGNOSIS — F411 Generalized anxiety disorder: Secondary | ICD-10-CM

## 2018-08-31 DIAGNOSIS — Z79899 Other long term (current) drug therapy: Secondary | ICD-10-CM

## 2018-08-31 DIAGNOSIS — B379 Candidiasis, unspecified: Secondary | ICD-10-CM | POA: Diagnosis not present

## 2018-08-31 DIAGNOSIS — R5383 Other fatigue: Secondary | ICD-10-CM

## 2018-08-31 LAB — POCT URINE DRUG SCREEN
POC Amphetamine UR: NOT DETECTED
POC BARBITURATE UR: NOT DETECTED
POC BENZODIAZEPINES UR: NOT DETECTED
POC Cocaine UR: NOT DETECTED
POC ECSTASY UR: NOT DETECTED
POC Marijuana UR: NOT DETECTED
POC Methadone UR: NOT DETECTED
POC Methamphetamine UR: NOT DETECTED
POC Opiate Ur: NOT DETECTED
POC Oxycodone UR: NOT DETECTED
POC PHENCYCLIDINE UR: NOT DETECTED
POC TRICYCLICS UR: NOT DETECTED

## 2018-08-31 MED ORDER — FLUCONAZOLE 150 MG PO TABS: ORAL_TABLET | ORAL | 0 refills | Status: DC

## 2018-08-31 MED ORDER — ZOLPIDEM TARTRATE ER 12.5 MG PO TBCR: 12.5000 mg | EXTENDED_RELEASE_TABLET | Freq: Every evening | ORAL | 3 refills | Status: DC | PRN

## 2018-08-31 MED ORDER — ALPRAZOLAM 0.5 MG PO TABS: 0.5000 mg | ORAL_TABLET | Freq: Every day | ORAL | 3 refills | Status: DC | PRN

## 2018-08-31 NOTE — Progress Notes (Signed)
[facility] [place], [place] [address]  Internal MEDICINE  Office Visit Note  Patient Name: Yvette Humphrey  [id number]  [id number]  Date of Service: 08/31/2018  Chief Complaint  Patient presents with  . Hypertension  . Headache    The patient is here for routine follow up visit. Today, she is complaining of trouble sleeping. Is waking up at about 2am and has a very hard time getting back to sleep. Has tl be up at 445am for work, and this is really interfering with her ability to rest well and concentrate during the day. She does take ambien at bedtime. Helps her to sleep well the first few hours, but then once awake, she can't fall back to sleep.  She is also complaining of yeast infection. Vaginal itching and irritation started yesterday. She has noted some white discharge. No abdominal pain and no odor present in vaginal discharge.       Current Medication: Outpatient Encounter Medications as of 08/31/2018  Medication Sig  . ALPRAZolam (XANAX) 0.5 MG tablet Take 1 tablet (0.5 mg total) by mouth daily as needed for anxiety.  Marland Kitchen BIOTIN PO Take by mouth daily.  . Butalbital-APAP-Caffeine (FIORICET) 50-300-40 MG CAPS Take 1 capsule by mouth as needed. migraines  . cetirizine (ZYRTEC) 10 MG tablet Take 10 mg by mouth daily.  . chlorpheniramine-HYDROcodone (TUSSIONEX PENNKINETIC ER) 10-8 MG/5ML SUER Take 5 mLs by mouth every 12 (twelve) hours as needed for cough.  . cyanocobalamin ([employer]S VITAMIN B12) 1000 MCG tablet Take by mouth.  . cyanocobalamin 500 MCG tablet Take 500 mcg by mouth daily.  . fluconazole (DIFLUCAN) 150 MG tablet TAKE 1 TABLET BY MOUTH 1 TIME. REPEAT AGAIN IF SYMPTOMS PERSISTS  . Lysine 1000 MG TABS Take by mouth daily as needed.  . Magnesium 250 MG TABS Take by mouth daily.  . meloxicam (MOBIC) 15 MG tablet Take 1 tablet (15 mg total) by mouth daily.  . metoprolol succinate (TOPROL-XL) 50 MG 24 hr tablet Take 1 tablet (50 mg total) by mouth  daily. Take with or immediately following a meal.  . Multiple Vitamins-Minerals (MULTIVITAMIN WOMEN PO) Take by mouth daily.  Marland Kitchen POTASSIUM CHLORIDE PO Take 10 mcg by mouth daily.  . sertraline (ZOLOFT) 100 MG tablet Take 1 tablet (100 mg total) by mouth daily.  . [DISCONTINUED] ALPRAZolam (XANAX) 0.5 MG tablet Take 1 tablet (0.5 mg total) by mouth daily as needed for anxiety.  . [DISCONTINUED] clarithromycin (BIAXIN) 500 MG tablet Take 1 tablet (500 mg total) by mouth 2 (two) times daily.  . [DISCONTINUED] fluconazole (DIFLUCAN) 150 MG tablet TAKE 1 TABLET BY MOUTH 1 TIME. REPEAT AGAIN IF SYMPTOMS PERSISTS  . [DISCONTINUED] predniSONE (STERAPRED UNI-PAK 21 TAB) 10 MG (21) TBPK tablet 6 day taper - take by mouth as directed for 6 days  . [DISCONTINUED] zolpidem (AMBIEN) 10 MG tablet Take 1 tablet (10 mg total) by mouth at bedtime as needed for sleep.  Marland Kitchen zolpidem (AMBIEN CR) 12.5 MG CR tablet Take 1 tablet (12.5 mg total) by mouth at bedtime as needed for sleep.   No facility-administered encounter medications on file as of 08/31/2018.     Surgical History: Past Surgical History:  Procedure Laterality Date  . CHOLECYSTECTOMY    . ENDOMETRIAL ABLATION     approx 2009  . ESOPHAGOGASTRODUODENOSCOPY    . REDUCTION MAMMAPLASTY Bilateral 2004  . TONSILLECTOMY    . TUBAL LIGATION    . ULNAR NERVE TRANSPOSITION Left 06/08/2016  Procedure: SUBCUTANEOUS ANTERIOR ULNAR NERVE TRANSPOSITION LEFT ELBOW;  Surgeon: Corky Mull, MD;  Location: [facility];  Service: Orthopedics;  Laterality: Left;    Medical History: Past Medical History:  Diagnosis Date  . Complication of anesthesia   . Headache    migraines, 1x/mo  . Hypertension   . Motion sickness    back seat cars  . PONV (postoperative nausea and vomiting)   . Tachycardia   . Wears contact lenses     Family History: Family History  Problem Relation Age of Onset  . Breast cancer Paternal Grandmother     Social History    Socioeconomic History  . Marital status: Married    Spouse name: Not on file  . Number of children: Not on file  . Years of education: Not on file  . Highest education level: Not on file  Occupational History  . Not on file  Social Needs  . Financial resource strain: Not on file  . Food insecurity:    Worry: Not on file    Inability: Not on file  . Transportation needs:    Medical: Not on file    Non-medical: Not on file  Tobacco Use  . Smoking status: Never Smoker  . Smokeless tobacco: Never Used  Substance and Sexual Activity  . Alcohol use: Yes    Alcohol/week: 1.0 standard drinks    Types: 1 Glasses of wine per week    Comment: occasionally  . Drug use: No  . Sexual activity: Not on file  Lifestyle  . Physical activity:    Days per week: Not on file    Minutes per session: Not on file  . Stress: Not on file  Relationships  . Social connections:    Talks on phone: Not on file    Gets together: Not on file    Attends religious service: Not on file    Active member of club or organization: Not on file    Attends meetings of clubs or organizations: Not on file    Relationship status: Not on file  . Intimate partner violence:    Fear of current or ex partner: Not on file    Emotionally abused: Not on file    Physically abused: Not on file    Forced sexual activity: Not on file  Other Topics Concern  . Not on file  Social History Narrative  . Not on file      Review of Systems  Constitutional: Positive for fatigue. Negative for activity change, chills, fever and unexpected weight change.  HENT: Negative for congestion, postnasal drip, sneezing, sore throat and voice change.   Respiratory: Negative for cough, chest tightness, shortness of breath and wheezing.   Cardiovascular: Negative for chest pain and palpitations.  Gastrointestinal: Negative for abdominal pain, constipation, diarrhea, nausea and vomiting.  Endocrine: Negative for cold intolerance, heat  intolerance, polydipsia and polyuria.  Genitourinary: Positive for vaginal discharge. Negative for dysuria and frequency.  Musculoskeletal: Negative for arthralgias, back pain, joint swelling and neck pain.  Skin: Negative for rash.  Allergic/Immunologic: Positive for environmental allergies.  Neurological: Positive for headaches. Negative for tremors and numbness.  Hematological: Negative for adenopathy. Does not bruise/bleed easily.  Psychiatric/Behavioral: Positive for dysphoric mood and sleep disturbance. Negative for behavioral problems (Depression) and suicidal ideas. The patient is not nervous/anxious.        Generalized anxiety which is well controlled on current medication.    Today's Vitals   08/31/18 0938  BP:  134/84  Pulse: 99  Resp: 16  SpO2: 95%  Weight: 189 lb (85.7 kg)  Height: 5' 3"  (1.6 m)    Physical Exam Vitals signs and nursing note reviewed.  Constitutional:      General: She is not in acute distress.    Appearance: Normal appearance. She is well-developed. She is not diaphoretic.  HENT:     Head: Normocephalic and atraumatic.     Mouth/Throat:     Pharynx: No oropharyngeal exudate.  Eyes:     Extraocular Movements: Extraocular movements intact.     Pupils: Pupils are equal, round, and reactive to light.  Neck:     Musculoskeletal: Normal range of motion and neck supple.     Thyroid: No thyromegaly.     Vascular: No JVD.     Trachea: No tracheal deviation.  Cardiovascular:     Rate and Rhythm: Normal rate and regular rhythm.     Heart sounds: Normal heart sounds. No murmur. No friction rub. No gallop.   Pulmonary:     Effort: Pulmonary effort is normal. No respiratory distress.     Breath sounds: Normal breath sounds. No wheezing or rales.  Chest:     Chest wall: No tenderness.  Abdominal:     General: Bowel sounds are normal.     Palpations: Abdomen is soft.     Tenderness: There is no abdominal tenderness.  Musculoskeletal: Normal range of  motion.  Lymphadenopathy:     Cervical: No cervical adenopathy.  Skin:    General: Skin is warm and dry.  Neurological:     Mental Status: She is alert and oriented to person, place, and time.     Cranial Nerves: No cranial nerve deficit.  Psychiatric:        Attention and Perception: Attention and perception normal.        Mood and Affect: Mood is anxious.        Speech: Speech normal.        Behavior: Behavior normal.        Thought Content: Thought content normal.        Cognition and Memory: Cognition and memory normal.        Judgment: Judgment normal.    Assessment/Plan: 1. Essential (primary) hypertension Stable. Continue bp medication as prescribed   2. Candidiasis Diflucan 127m once. May repeat in three days if needed for persistent symptoms. - fluconazole (DIFLUCAN) 150 MG tablet; TAKE 1 TABLET BY MOUTH 1 TIME. REPEAT AGAIN IF SYMPTOMS PERSISTS  Dispense: 3 tablet; Refill: 0  3. Primary insomnia Change zolpidem to zolpidem CR 12.553mtablets at bedtime as needed for insomnia.  - zolpidem (AMBIEN CR) 12.5 MG CR tablet; Take 1 tablet (12.5 mg total) by mouth at bedtime as needed for sleep.  Dispense: 30 tablet; Refill: 3  4. GAD (generalized anxiety disorder) Continue sertraline as prescribed. May take alprazolam 0.29m11maily if needed for acute anxiety.  - ALPRAZolam (XANAX) 0.5 MG tablet; Take 1 tablet (0.5 mg total) by mouth daily as needed for anxiety.  Dispense: 30 tablet; Refill: 3  5. Fatigue, unspecified type Likely due to trouble sleeping. Medication change as above. Reassess at next visit.   6. Encounter for long-term (current) use of medications - POCT Urine Drug Screen negative for all controlled medication.  General Counseling: Susellinor testderstanding of the findings of todays visit and agrees with plan of treatment. I have discussed any further diagnostic evaluation that may be needed or ordered today. We also  reviewed her medications today. she has  been encouraged to call the office with any questions or concerns that should arise related to todays visit.  Reviewed risks and possible side effects associated with taking opiates, benzodiazepines and other CNS depressants. Combination of these could cause dizziness and drowsiness. Advised patient not to drive or operate machinery when taking these medications, as patient's and other's life can be at risk and will have consequences. Patient verbalized understanding in this matter. Dependence and abuse for these drugs will be monitored closely. A Controlled substance policy and procedure is on file which allows [facility] to order a urine drug screen test at any visit. Patient understands and agrees with the plan  This patient was seen by Leretha Pol FNP Collaboration with Dr Lavera Guise as a part of collaborative care agreement  Orders Placed This Encounter  Procedures  . POCT Urine Drug Screen    Meds ordered this encounter  Medications  . fluconazole (DIFLUCAN) 150 MG tablet    Sig: TAKE 1 TABLET BY MOUTH 1 TIME. REPEAT AGAIN IF SYMPTOMS PERSISTS    Dispense:  3 tablet    Refill:  0    Order Specific Question:   Supervising Provider    Answer:   Lavera Guise [[id number]]  . zolpidem (AMBIEN CR) 12.5 MG CR tablet    Sig: Take 1 tablet (12.5 mg total) by mouth at bedtime as needed for sleep.    Dispense:  30 tablet    Refill:  3    Zolpidem 35m is not helping with patient's insomnia. Waking up very early and unable to go back to sleep    Order Specific Question:   Supervising Provider    Answer:   KLavera Guise[[place] . ALPRAZolam (XANAX) 0.5 MG tablet    Sig: Take 1 tablet (0.5 mg total) by mouth daily as needed for anxiety.    Dispense:  30 tablet    Refill:  3    Order Specific Question:   Supervising Provider    Answer:   KLavera Guise[[[id number]]   Time spent: 266Minutes      Dr FLavera GuiseInternal medicine

## 2018-09-04 ENCOUNTER — Telehealth: Payer: Self-pay | Admitting: Nurse Practitioner

## 2018-09-04 NOTE — Telephone Encounter (Signed)
Prior Yvette Humphrey was approved for Ambien , patient called and asked if it was done Nimisha explained to her that the prior auth just came in this morning and was approved , patient can tell pharmacy to fill rx.

## 2018-09-07 ENCOUNTER — Ambulatory Visit: Payer: Self-pay | Admitting: Nurse Practitioner

## 2018-10-09 ENCOUNTER — Other Ambulatory Visit: Payer: Self-pay

## 2018-10-09 ENCOUNTER — Encounter: Payer: Self-pay | Admitting: Nurse Practitioner

## 2018-10-09 ENCOUNTER — Ambulatory Visit: Payer: BC Managed Care – PPO | Admitting: Nurse Practitioner

## 2018-10-09 VITALS — BP 138/82 | HR 85 | Temp 98.9°F | Resp 16 | Ht 63.0 in | Wt 188.4 lb

## 2018-10-09 DIAGNOSIS — R109 Unspecified abdominal pain: Secondary | ICD-10-CM | POA: Diagnosis not present

## 2018-10-09 DIAGNOSIS — R1084 Generalized abdominal pain: Secondary | ICD-10-CM

## 2018-10-09 DIAGNOSIS — R197 Diarrhea, unspecified: Secondary | ICD-10-CM | POA: Diagnosis not present

## 2018-10-09 MED ORDER — DICYCLOMINE HCL 10 MG PO CAPS: ORAL_CAPSULE | ORAL | 1 refills | Status: DC

## 2018-10-09 NOTE — Progress Notes (Signed)
Pt blood pressure elevated was taken twice:  1st reading: 143/84 machine 2nd reading 138/82 Informed provider of pt blood pressure

## 2018-10-09 NOTE — Progress Notes (Signed)
[facility] [place], [place] [address]  Internal MEDICINE  Office Visit Note  Patient Name: Yvette Humphrey  [id number]  [id number]  Date of Service: 10/10/2018  Chief Complaint  Patient presents with  . Diarrhea    Been going on for 2 1/2 wks has been like water, pt states she has stomach issues but this feels different, pt has not been aroound anyone sick, no fever, chills or body aches with the diarrhea     The patient states that she is having several episodes, each day, of intestinal cramping and diarrhea. Episodes of cramping do not seem to be lrealted to eating or anything in particular. Will sometimes occur in the middle of the night, waking her up from sleep. Stools have consistency of water. Once she is able to relieve her bowels, her pain goes away. Has been happening nearly every day for past 2 weeks or longer. Has had reduced appetite. Gets full very quickly. And eating makes her feel nauseated. She had had removal of gallbladder in the past. She has had no fever, chills, headaches, or body aches associated with these symptoms.   Pt is here for a sick visit.     Current Medication:  Outpatient Encounter Medications as of 10/09/2018  Medication Sig  . ALPRAZolam (XANAX) 0.5 MG tablet Take 1 tablet (0.5 mg total) by mouth daily as needed for anxiety.  Marland Kitchen BIOTIN PO Take by mouth daily.  . Butalbital-APAP-Caffeine (FIORICET) 50-300-40 MG CAPS Take 1 capsule by mouth as needed. migraines  . cetirizine (ZYRTEC) 10 MG tablet Take 10 mg by mouth daily.  . chlorpheniramine-HYDROcodone (TUSSIONEX PENNKINETIC ER) 10-8 MG/5ML SUER Take 5 mLs by mouth every 12 (twelve) hours as needed for cough.  . cyanocobalamin ([employer] VITAMIN B12) 1000 MCG tablet Take by mouth.  . cyanocobalamin 500 MCG tablet Take 500 mcg by mouth daily.  Marland Kitchen dicyclomine (BENTYL) 10 MG capsule Take 1 to 2 capsules po TID prn cramping  . fluconazole (DIFLUCAN) 150 MG tablet TAKE 1 TABLET BY  MOUTH 1 TIME. REPEAT AGAIN IF SYMPTOMS PERSISTS  . Lysine 1000 MG TABS Take by mouth daily as needed.  . Magnesium 250 MG TABS Take by mouth daily.  . meloxicam (MOBIC) 15 MG tablet Take 1 tablet (15 mg total) by mouth daily.  . metoprolol succinate (TOPROL-XL) 50 MG 24 hr tablet Take 1 tablet (50 mg total) by mouth daily. Take with or immediately following a meal.  . Multiple Vitamins-Minerals (MULTIVITAMIN WOMEN PO) Take by mouth daily.  Marland Kitchen POTASSIUM CHLORIDE PO Take 10 mcg by mouth daily.  . sertraline (ZOLOFT) 100 MG tablet Take 1 tablet (100 mg total) by mouth daily.  Marland Kitchen zolpidem (AMBIEN CR) 12.5 MG CR tablet Take 1 tablet (12.5 mg total) by mouth at bedtime as needed for sleep.   No facility-administered encounter medications on file as of 10/09/2018.       Medical History: Past Medical History:  Diagnosis Date  . Complication of anesthesia   . Headache    migraines, 1x/mo  . Hypertension   . Motion sickness    back seat cars  . PONV (postoperative nausea and vomiting)   . Tachycardia   . Wears contact lenses    Today's Vitals   10/09/18 1620  BP: 138/82  Pulse: 85  Resp: 16  Temp: 98.9 F (37.2 C)  SpO2: 99%  Weight: 188 lb 6.4 oz (85.5 kg)  Height: 5' 3"  (1.6 m)   Body mass  index is 33.37 kg/m.  Review of Systems  Constitutional: Positive for appetite change and fatigue. Negative for chills and unexpected weight change.  HENT: Negative for congestion, postnasal drip, rhinorrhea, sneezing and sore throat.   Respiratory: Negative for cough, chest tightness, shortness of breath and wheezing.   Cardiovascular: Negative for chest pain and palpitations.  Gastrointestinal: Positive for abdominal distention, abdominal pain, diarrhea and nausea. Negative for constipation and vomiting.  Genitourinary: Negative for dysuria and frequency.  Musculoskeletal: Positive for back pain. Negative for arthralgias, joint swelling and neck pain.  Skin: Negative for rash.   Neurological: Negative for tremors, numbness and headaches.  Hematological: Negative for adenopathy. Does not bruise/bleed easily.  Psychiatric/Behavioral: Negative for behavioral problems (Depression), sleep disturbance and suicidal ideas. The patient is not nervous/anxious.     Physical Exam Vitals signs and nursing note reviewed.  Constitutional:      General: She is not in acute distress.    Appearance: Normal appearance. She is well-developed. She is not diaphoretic.  HENT:     Head: Normocephalic and atraumatic.     Mouth/Throat:     Pharynx: No oropharyngeal exudate.  Eyes:     Pupils: Pupils are equal, round, and reactive to light.  Neck:     Musculoskeletal: Normal range of motion and neck supple.     Thyroid: No thyromegaly.     Vascular: No JVD.     Trachea: No tracheal deviation.  Cardiovascular:     Rate and Rhythm: Normal rate and regular rhythm.     Heart sounds: Normal heart sounds. No murmur. No friction rub. No gallop.   Pulmonary:     Effort: Pulmonary effort is normal. No respiratory distress.     Breath sounds: Normal breath sounds. No wheezing or rales.  Chest:     Chest wall: No tenderness.  Abdominal:     General: Bowel sounds are increased.     Palpations: Abdomen is soft.     Comments: Mild, generalized abdominal tenderness with palpation.   Musculoskeletal: Normal range of motion.  Lymphadenopathy:     Cervical: No cervical adenopathy.  Skin:    General: Skin is warm and dry.  Neurological:     Mental Status: She is alert and oriented to person, place, and time.     Cranial Nerves: No cranial nerve deficit.  Psychiatric:        Behavior: Behavior normal.        Thought Content: Thought content normal.        Judgment: Judgment normal.   Assessment/Plan:  1. Diarrhea, unspecified type Stool studies ordered and lab slip given to patient. She has slip to check routine labs, including CBC and CMP. Start dicyclomine 14m, taking 1 to 2 capsules  up to three times daily if needed for cramping and diarrhea.  - dicyclomine (BENTYL) 10 MG capsule; Take 1 to 2 capsules po TID prn cramping  Dispense: 120 capsule; Refill: 1  2. Abdominal cramping Start dicyclomine 116m taking 1 to 2 capsules up to three times daily if needed for cramping and diarrhea.  - dicyclomine (BENTYL) 10 MG capsule; Take 1 to 2 capsules po TID prn cramping  Dispense: 120 capsule; Refill: 1  3. Generalized abdominal pain Will get CT abdomen and pelvis with oral and IV contrast for further evaluation.  - CT Abdomen Pelvis W Contrast; Future  General Counseling: Sukatilynn sinklernderstanding of the findings of todays visit and agrees with plan of treatment. I have discussed any further  diagnostic evaluation that may be needed or ordered today. We also reviewed her medications today. she has been encouraged to call the office with any questions or concerns that should arise related to todays visit.    Counseling:  The 'BRAT' diet is suggested, then progress to diet as tolerated as symptoms abate. Call if bloody stools, persistent diarrhea, vomiting, fever or abdominal pain.  This patient was seen by Leretha Pol FNP Collaboration with Dr Lavera Guise as a part of collaborative care agreement  Orders Placed This Encounter  Procedures  . CT Abdomen Pelvis W Contrast    Meds ordered this encounter  Medications  . dicyclomine (BENTYL) 10 MG capsule    Sig: Take 1 to 2 capsules po TID prn cramping    Dispense:  120 capsule    Refill:  1    Order Specific Question:   Supervising Provider    Answer:   Lavera Guise [[id number]]    Time spent: 25 Minutes

## 2018-10-10 DIAGNOSIS — R197 Diarrhea, unspecified: Secondary | ICD-10-CM | POA: Insufficient documentation

## 2018-10-10 DIAGNOSIS — R109 Unspecified abdominal pain: Secondary | ICD-10-CM | POA: Insufficient documentation

## 2018-10-10 DIAGNOSIS — R1084 Generalized abdominal pain: Secondary | ICD-10-CM | POA: Insufficient documentation

## 2018-10-26 ENCOUNTER — Other Ambulatory Visit: Payer: Self-pay | Admitting: Nurse Practitioner

## 2018-10-26 ENCOUNTER — Other Ambulatory Visit: Payer: BC Managed Care – PPO

## 2018-10-26 ENCOUNTER — Other Ambulatory Visit: Payer: Self-pay

## 2018-10-26 DIAGNOSIS — R1084 Generalized abdominal pain: Secondary | ICD-10-CM

## 2018-10-27 LAB — LIPID PANEL W/O CHOL/HDL RATIO
CHOLESTEROL TOTAL: 217 mg/dL — AB (ref 100–199)
HDL: 42 mg/dL (ref >39)
LDL CALC: 124 mg/dL — AB (ref 0–99)
TRIGLYCERIDES: 254 mg/dL — AB (ref 0–149)
VLDL Cholesterol Cal: 51 mg/dL — ABNORMAL HIGH (ref 5–40)

## 2018-10-27 LAB — COMPREHENSIVE METABOLIC PANEL
A/G RATIO: 2.1 (ref 1.2–2.2)
ALT: 58 IU/L — AB (ref 0–32)
AST: 38 IU/L (ref 0–40)
Albumin: 4.9 g/dL — ABNORMAL HIGH (ref 3.8–4.8)
Alkaline Phosphatase: 55 IU/L (ref 39–117)
BILIRUBIN TOTAL: 0.3 mg/dL (ref 0.0–1.2)
BUN/Creatinine Ratio: 19 (ref 9–23)
BUN: 12 mg/dL (ref 6–24)
CALCIUM: 9.7 mg/dL (ref 8.7–10.2)
CO2: 19 mmol/L — ABNORMAL LOW (ref 20–29)
Chloride: 103 mmol/L (ref 96–106)
Creatinine, Ser: 0.64 mg/dL (ref 0.57–1.00)
GFR calc Af Amer: 125 mL/min/{1.73_m2} (ref >59)
GFR calc non Af Amer: 108 mL/min/{1.73_m2} (ref >59)
Globulin, Total: 2.3 g/dL (ref 1.5–4.5)
Glucose: 93 mg/dL (ref 65–99)
POTASSIUM: 4.5 mmol/L (ref 3.5–5.2)
Sodium: 138 mmol/L (ref 134–144)
Total Protein: 7.2 g/dL (ref 6.0–8.5)

## 2018-10-27 LAB — CBC
HEMOGLOBIN: 14.1 g/dL (ref 11.1–15.9)
Hematocrit: 41.4 % (ref 34.0–46.6)
MCH: 31.1 pg (ref 26.6–33.0)
MCHC: 34.1 g/dL (ref 31.5–35.7)
MCV: 91 fL (ref 79–97)
Platelets: 299 10*3/uL (ref 150–450)
RBC: 4.53 x10E6/uL (ref 3.77–5.28)
RDW: 11.8 % (ref 11.7–15.4)
WBC: 9.1 10*3/uL (ref 3.4–10.8)

## 2018-10-27 LAB — T3: T3, Total: 87 ng/dL (ref 71–180)

## 2018-10-27 LAB — T4, FREE: Free T4: 1.05 ng/dL (ref 0.82–1.77)

## 2018-10-27 LAB — VITAMIN D 25 HYDROXY (VIT D DEFICIENCY, FRACTURES): Vit D, 25-Hydroxy: 19.3 ng/mL — ABNORMAL LOW (ref 30.0–100.0)

## 2018-10-27 LAB — TSH: TSH: 1.26 u[IU]/mL (ref 0.450–4.500)

## 2018-10-29 ENCOUNTER — Other Ambulatory Visit: Payer: Self-pay | Admitting: Nurse Practitioner

## 2018-11-02 LAB — STOOL CULTURE: E coli, Shiga toxin Assay: NEGATIVE

## 2018-11-02 LAB — CLOSTRIDIUM DIFFICILE EIA: C difficile Toxins A+B, EIA: NEGATIVE

## 2018-11-02 LAB — OVA AND PARASITE EXAMINATION

## 2018-11-13 ENCOUNTER — Other Ambulatory Visit: Payer: Self-pay | Admitting: Internal Medicine

## 2018-11-15 ENCOUNTER — Other Ambulatory Visit: Payer: Self-pay | Admitting: Nurse Practitioner

## 2018-11-15 ENCOUNTER — Other Ambulatory Visit: Payer: Self-pay

## 2018-11-15 DIAGNOSIS — F411 Generalized anxiety disorder: Secondary | ICD-10-CM

## 2018-11-15 DIAGNOSIS — R1084 Generalized abdominal pain: Secondary | ICD-10-CM

## 2018-11-15 DIAGNOSIS — I10 Essential (primary) hypertension: Secondary | ICD-10-CM

## 2018-11-15 MED ORDER — SERTRALINE HCL 100 MG PO TABS: 100.0000 mg | ORAL_TABLET | Freq: Every day | ORAL | 1 refills | Status: DC

## 2018-11-15 MED ORDER — MELOXICAM 15 MG PO TABS: 15.0000 mg | ORAL_TABLET | Freq: Every day | ORAL | 5 refills | Status: DC

## 2018-11-15 MED ORDER — METOPROLOL SUCCINATE ER 50 MG PO TB24: 50.0000 mg | ORAL_TABLET | Freq: Every day | ORAL | 1 refills | Status: DC

## 2018-11-26 ENCOUNTER — Telehealth: Payer: Self-pay

## 2018-11-26 ENCOUNTER — Other Ambulatory Visit: Payer: Self-pay

## 2018-11-26 DIAGNOSIS — B379 Candidiasis, unspecified: Secondary | ICD-10-CM

## 2018-11-26 MED ORDER — FLUCONAZOLE 150 MG PO TABS: ORAL_TABLET | ORAL | 0 refills | Status: DC

## 2018-11-26 NOTE — Telephone Encounter (Signed)
Looks like someone had put message on there to have this held, so idk why this wasn't discussed. Anyway, she has small, non-obstructing renal stones, bilaterally. There is mild fatty infiltration of the liver. Otherwise, ultrasound looked fine. We should go ahead with urology referral.

## 2018-11-26 NOTE — Telephone Encounter (Signed)
Pt advised for u/s result and gave beth for referral

## 2018-12-28 ENCOUNTER — Encounter: Payer: Self-pay | Admitting: Nurse Practitioner

## 2018-12-28 ENCOUNTER — Ambulatory Visit: Payer: BC Managed Care – PPO | Admitting: Nurse Practitioner

## 2018-12-28 ENCOUNTER — Other Ambulatory Visit: Payer: Self-pay

## 2018-12-28 VITALS — Resp 16 | Ht 63.0 in | Wt 183.0 lb

## 2018-12-28 DIAGNOSIS — G43009 Migraine without aura, not intractable, without status migrainosus: Secondary | ICD-10-CM

## 2018-12-28 DIAGNOSIS — E559 Vitamin D deficiency, unspecified: Secondary | ICD-10-CM

## 2018-12-28 DIAGNOSIS — F5101 Primary insomnia: Secondary | ICD-10-CM

## 2018-12-28 DIAGNOSIS — I10 Essential (primary) hypertension: Secondary | ICD-10-CM

## 2018-12-28 DIAGNOSIS — F411 Generalized anxiety disorder: Secondary | ICD-10-CM

## 2018-12-28 DIAGNOSIS — Z1239 Encounter for other screening for malignant neoplasm of breast: Secondary | ICD-10-CM

## 2018-12-28 MED ORDER — ALPRAZOLAM 0.5 MG PO TABS: 0.5000 mg | ORAL_TABLET | Freq: Every day | ORAL | 3 refills | Status: DC | PRN

## 2018-12-28 MED ORDER — ZOLPIDEM TARTRATE ER 12.5 MG PO TBCR: 12.5000 mg | EXTENDED_RELEASE_TABLET | Freq: Every evening | ORAL | 3 refills | Status: DC | PRN

## 2018-12-28 MED ORDER — ERGOCALCIFEROL 1.25 MG (50000 UT) PO CAPS: 50000.0000 [IU] | ORAL_CAPSULE | ORAL | 5 refills | Status: DC

## 2018-12-28 MED ORDER — SERTRALINE HCL 100 MG PO TABS: 100.0000 mg | ORAL_TABLET | Freq: Every day | ORAL | 3 refills | Status: DC

## 2018-12-28 MED ORDER — METOPROLOL SUCCINATE ER 50 MG PO TB24: 50.0000 mg | ORAL_TABLET | Freq: Every day | ORAL | 3 refills | Status: DC

## 2018-12-28 MED ORDER — BUTALBITAL-APAP-CAFFEINE 50-300-40 MG PO CAPS: 1.0000 | ORAL_CAPSULE | ORAL | 3 refills | Status: DC | PRN

## 2018-12-28 NOTE — Progress Notes (Signed)
[facility] [place], [place] [address]  Internal MEDICINE  Telephone Visit  Patient Name: Yvette Humphrey  [id number]  [id number]  Date of Service: 01/12/2019  I connected with the patient at 10:50am by webcam and verified the patients identity using two identifiers.   I discussed the limitations, risks, security and privacy concerns of performing an evaluation and management service by webcam and the availability of in person appointments. I also discussed with the patient that there may be a patient responsible charge related to the service.  The patient expressed understanding and agrees to proceed.    Chief Complaint  Patient presents with  . Telephone Assessment  . Telephone Screen  . Hypertension    The patient has been contacted via webcam for follow up visit due to concerns for spread of novel coronavirus. She states that she is doing well. She has no concerns or complaints to discuss today. She does need to have refills for some of her current medications .      Current Medication: Outpatient Encounter Medications as of 12/28/2018  Medication Sig  . ALPRAZolam (XANAX) 0.5 MG tablet Take 1 tablet (0.5 mg total) by mouth daily as needed for anxiety.  Marland Kitchen BIOTIN PO Take by mouth daily.  . Butalbital-APAP-Caffeine (FIORICET) 50-300-40 MG CAPS Take 1 capsule by mouth as needed. migraines  . cetirizine (ZYRTEC) 10 MG tablet Take 10 mg by mouth daily.  . chlorpheniramine-HYDROcodone (TUSSIONEX PENNKINETIC ER) 10-8 MG/5ML SUER Take 5 mLs by mouth every 12 (twelve) hours as needed for cough.  . cyanocobalamin ([employer]S VITAMIN B12) 1000 MCG tablet Take by mouth.  . cyanocobalamin 500 MCG tablet Take 500 mcg by mouth daily.  Marland Kitchen dicyclomine (BENTYL) 10 MG capsule Take 1 to 2 capsules po TID prn cramping  . fluconazole (DIFLUCAN) 150 MG tablet TAKE 1 TABLET BY MOUTH 1 TIME. REPEAT AGAIN IF SYMPTOMS PERSISTS  . Lysine 1000 MG TABS Take by mouth daily as needed.  .  Magnesium 250 MG TABS Take by mouth daily.  . meloxicam (MOBIC) 15 MG tablet Take 1 tablet (15 mg total) by mouth daily.  . metoprolol succinate (TOPROL-XL) 50 MG 24 hr tablet Take 1 tablet (50 mg total) by mouth daily. Take with or immediately following a meal.  . Multiple Vitamins-Minerals (MULTIVITAMIN WOMEN PO) Take by mouth daily.  Marland Kitchen POTASSIUM CHLORIDE PO Take 10 mcg by mouth daily.  . sertraline (ZOLOFT) 100 MG tablet Take 1 tablet (100 mg total) by mouth daily.  Marland Kitchen zolpidem (AMBIEN CR) 12.5 MG CR tablet Take 1 tablet (12.5 mg total) by mouth at bedtime as needed for sleep.  . [DISCONTINUED] ALPRAZolam (XANAX) 0.5 MG tablet Take 1 tablet (0.5 mg total) by mouth daily as needed for anxiety.  . [DISCONTINUED] Butalbital-APAP-Caffeine (FIORICET) 50-300-40 MG CAPS Take 1 capsule by mouth as needed. migraines  . [DISCONTINUED] metoprolol succinate (TOPROL-XL) 50 MG 24 hr tablet Take 1 tablet (50 mg total) by mouth daily. Take with or immediately following a meal.  . [DISCONTINUED] sertraline (ZOLOFT) 100 MG tablet Take 1 tablet (100 mg total) by mouth daily.  . [DISCONTINUED] zolpidem (AMBIEN CR) 12.5 MG CR tablet Take 1 tablet (12.5 mg total) by mouth at bedtime as needed for sleep.  . ergocalciferol (DRISDOL) 1.25 MG (50000 UT) capsule Take 1 capsule (50,000 Units total) by mouth once a week.   No facility-administered encounter medications on file as of 12/28/2018.     Surgical History: Past Surgical History:  Procedure  Laterality Date  . CHOLECYSTECTOMY    . ENDOMETRIAL ABLATION     approx 2009  . ESOPHAGOGASTRODUODENOSCOPY    . REDUCTION MAMMAPLASTY Bilateral 2004  . TONSILLECTOMY    . TUBAL LIGATION    . ULNAR NERVE TRANSPOSITION Left 06/08/2016   Procedure: SUBCUTANEOUS ANTERIOR ULNAR NERVE TRANSPOSITION LEFT ELBOW;  Surgeon: Corky Mull, MD;  Location: [facility];  Service: Orthopedics;  Laterality: Left;    Medical History: Past Medical History:  Diagnosis Date  .  Complication of anesthesia   . Headache    migraines, 1x/mo  . Hypertension   . Motion sickness    back seat cars  . PONV (postoperative nausea and vomiting)   . Tachycardia   . Wears contact lenses     Family History: Family History  Problem Relation Age of Onset  . Breast cancer Paternal Grandmother     Social History   Socioeconomic History  . Marital status: Married    Spouse name: Not on file  . Number of children: Not on file  . Years of education: Not on file  . Highest education level: Not on file  Occupational History  . Not on file  Social Needs  . Financial resource strain: Not on file  . Food insecurity:    Worry: Not on file    Inability: Not on file  . Transportation needs:    Medical: Not on file    Non-medical: Not on file  Tobacco Use  . Smoking status: Never Smoker  . Smokeless tobacco: Never Used  Substance and Sexual Activity  . Alcohol use: Yes    Alcohol/week: 1.0 standard drinks    Types: 1 Glasses of wine per week    Comment: occasionally  . Drug use: No  . Sexual activity: Not on file  Lifestyle  . Physical activity:    Days per week: Not on file    Minutes per session: Not on file  . Stress: Not on file  Relationships  . Social connections:    Talks on phone: Not on file    Gets together: Not on file    Attends religious service: Not on file    Active member of club or organization: Not on file    Attends meetings of clubs or organizations: Not on file    Relationship status: Not on file  . Intimate partner violence:    Fear of current or ex partner: Not on file    Emotionally abused: Not on file    Physically abused: Not on file    Forced sexual activity: Not on file  Other Topics Concern  . Not on file  Social History Narrative  . Not on file      Review of Systems  Constitutional: Negative for appetite change, chills, fatigue and unexpected weight change.  HENT: Negative for congestion, postnasal drip, rhinorrhea,  sneezing and sore throat.   Respiratory: Negative for cough, chest tightness, shortness of breath and wheezing.   Cardiovascular: Negative for chest pain and palpitations.  Gastrointestinal: Negative for abdominal distention, abdominal pain, constipation, diarrhea, nausea and vomiting.  Endocrine: Negative for cold intolerance, heat intolerance, polydipsia and polyuria.  Musculoskeletal: Positive for back pain. Negative for arthralgias, joint swelling and neck pain.  Skin: Negative for rash.  Neurological: Negative for tremors, numbness and headaches.  Hematological: Negative for adenopathy. Does not bruise/bleed easily.  Psychiatric/Behavioral: Positive for dysphoric mood. Negative for behavioral problems (Depression), sleep disturbance and suicidal ideas. The patient is nervous/anxious.  Today's Vitals   12/28/18 1026  Resp: 16  Weight: 183 lb (83 kg)  Height: 5' 3"  (1.6 m)   Body mass index is 32.42 kg/m.  Observation/Objective:   The patient is alert and oriented. She is pleasant and answers all questions appropriately. Breathing is non-labored. She is in no acute distress at this time.    Assessment/Plan: 1. Essential (primary) hypertension Stable. Continue toprol as prescribed. Refills provided today.  - metoprolol succinate (TOPROL-XL) 50 MG 24 hr tablet; Take 1 tablet (50 mg total) by mouth daily. Take with or immediately following a meal.  Dispense: 30 tablet; Refill: 3  2. Migraine without aura and without status migrainosus, not intractable May use fioricet as needed and as prescribed for acute headaches. New prescription provided today.  - Butalbital-APAP-Caffeine (FIORICET) 50-300-40 MG CAPS; Take 1 capsule by mouth as needed. migraines  Dispense: 45 capsule; Refill: 3  3. GAD (generalized anxiety disorder) continue sertraline 144m daily. May take alprazolam 0.510mdaily as needed for acute anxiety. Refills provided for both today.  - ALPRAZolam (XANAX) 0.5 MG  tablet; Take 1 tablet (0.5 mg total) by mouth daily as needed for anxiety.  Dispense: 30 tablet; Refill: 3 - sertraline (ZOLOFT) 100 MG tablet; Take 1 tablet (100 mg total) by mouth daily.  Dispense: 30 tablet; Refill: 3  4. Primary insomnia May continue ambien CR 12.47m147mt bedtime as needed for insomnia. New prescription provided today.  - zolpidem (AMBIEN CR) 12.5 MG CR tablet; Take 1 tablet (12.5 mg total) by mouth at bedtime as needed for sleep.  Dispense: 30 tablet; Refill: 3  5. Screening for breast cancer - MM DIGITAL SCREENING BILATERAL; Future  6. Vitamin D deficiency - ergocalciferol (DRISDOL) 1.25 MG (50000 UT) capsule; Take 1 capsule (50,000 Units total) by mouth once a week.  Dispense: 4 capsule; Refill: 5  General Counseling: Susbevin dasderstanding of the findings of today's phone visit and agrees with plan of treatment. I have discussed any further diagnostic evaluation that may be needed or ordered today. We also reviewed her medications today. she has been encouraged to call the office with any questions or concerns that should arise related to todays visit.  This patient was seen by HeaLeretha PolP Collaboration with Dr FozLavera Guise a part of collaborative care agreement  Orders Placed This Encounter  Procedures  . MM DIGITAL SCREENING BILATERAL    Meds ordered this encounter  Medications  . ALPRAZolam (XANAX) 0.5 MG tablet    Sig: Take 1 tablet (0.5 mg total) by mouth daily as needed for anxiety.    Dispense:  30 tablet    Refill:  3    Order Specific Question:   Supervising Provider    Answer:   KHALavera Guise4[[id number]] Butalbital-APAP-Caffeine (FIORICET) 50-300-40 MG CAPS    Sig: Take 1 capsule by mouth as needed. migraines    Dispense:  45 capsule    Refill:  3    Order Specific Question:   Supervising Provider    Answer:   KHALavera Guise4[[id number]] metoprolol succinate (TOPROL-XL) 50 MG 24 hr tablet    Sig: Take 1 tablet (50 mg total) by mouth  daily. Take with or immediately following a meal.    Dispense:  30 tablet    Refill:  3    Order Specific Question:   Supervising Provider    Answer:   KHALavera Guise4[place] sertraline (ZOLOFT) 100 MG tablet  Sig: Take 1 tablet (100 mg total) by mouth daily.    Dispense:  30 tablet    Refill:  3    Please note increased dose.    Order Specific Question:   Supervising Provider    Answer:   Lavera Guise [[id number]]  . zolpidem (AMBIEN CR) 12.5 MG CR tablet    Sig: Take 1 tablet (12.5 mg total) by mouth at bedtime as needed for sleep.    Dispense:  30 tablet    Refill:  3    This is only refill of medications the patient will need to pick up this weekend.    Order Specific Question:   Supervising Provider    Answer:   Lavera Guise [[id number]]  . ergocalciferol (DRISDOL) 1.25 MG (50000 UT) capsule    Sig: Take 1 capsule (50,000 Units total) by mouth once a week.    Dispense:  4 capsule    Refill:  5    Order Specific Question:   Supervising Provider    Answer:   Lavera Guise [[id number]]    Time spent: 9 Minutes    Dr Lavera Guise Internal medicine

## 2019-01-03 ENCOUNTER — Telehealth: Payer: Self-pay | Admitting: Urology

## 2019-01-09 ENCOUNTER — Other Ambulatory Visit: Payer: Self-pay

## 2019-01-09 ENCOUNTER — Telehealth (INDEPENDENT_AMBULATORY_CARE_PROVIDER_SITE_OTHER): Payer: BC Managed Care – PPO | Admitting: Urology

## 2019-01-09 DIAGNOSIS — N200 Calculus of kidney: Secondary | ICD-10-CM | POA: Diagnosis not present

## 2019-01-09 DIAGNOSIS — N281 Cyst of kidney, acquired: Secondary | ICD-10-CM

## 2019-01-14 NOTE — Progress Notes (Signed)
Virtual Visit via Video Note  I connected with Yvette Yvette on 01/09/19 at  4:20 PM EDT by a video enabled telemedicine application and verified that I am speaking with the correct person using two identifiers.  Location: Patient: riding in care Provider: office   I discussed the limitations of evaluation and management by telemedicine and the availability of in person appointments. The patient expressed understanding and agreed to proceed.  History of Present Illness: 43 year old female who presents today via virtual visit to establish care.  She believes that she is referred for further evaluation of kidney stones, however, the referral from her primary care physician, Dr. Humphrey Humphrey indicates referral is for renal cyst.   Yvette Yvette reports that she was having episodes of intermittent abdominal pain.  As part of this evaluation, she underwent abdominal ultrasound performed at [facility] on 10/26/2026.  I do not have the images immediately available to me but the report does indicate that she has a 2.5 cm right upper pole renal cyst (presumably without concerning features based on report dictation) as well as bilateral nonobstructing kidney stones up to 4 mm bilaterally in the mid/upper poles.  She denies any recent flank pain or gross hematuria.  She does have a personal history of kidney stones remotely which she passed spontaneously during her pregnancy but had no other issues.  No previous stone surgeries.  She denies any urinary issues.  She does drink very large quantities of water.  No dietary risk factors for kidney stones.   Observations/Objective: Patient appears well, healthy, riding as a passenger in a car during the visit today.  Assessment and Plan:  1. Kidney stones Incidental bilateral kidney stones up to 4 mm incidentally identified on ultrasound Asymptomatic Reviewed that ultrasound often can underestimate or possibly overestimate the size and number of stones She is  interested in having KUB for further diagnostic evaluation If her stones are in fact small and nonobstructing, would not recommend any intervention-she is agreeable this plan We discussed general stone prevention techniques including drinking plenty water with goal of producing 2.5 L urine daily, increased citric acid intake, avoidance of high oxalate containing foods, and decreased salt intake.  Information about dietary recommendations given today. - Abdomen 1 view (KUB); Future  2. Renal cyst Unclear if this is the reason for her consult as she was under the impression it was for kidney stones 2.5 cm small right upper pole renal cyst per report- does not indicate any concerning features I have advised her if she is able to get the disc and I am happy to review it for her but a simple renal cyst does not require any further evaluation or treatment She is agreeable this plan   Follow Up Instructions: KUB-we will call her with the results   I discussed the assessment and treatment plan with the patient. The patient was provided an opportunity to ask questions and all were answered. The patient agreed with the plan and demonstrated an understanding of the instructions.   The patient was advised to call back or seek an in-person evaluation if the symptoms worsen or if the condition fails to improve as anticipated.  I provided 15 minutes of non-face-to-face time during this encounter.   Yvette Espy, MD

## 2019-01-24 ENCOUNTER — Other Ambulatory Visit: Payer: Self-pay

## 2019-01-24 ENCOUNTER — Ambulatory Visit: Payer: BC Managed Care – PPO | Admitting: Nurse Practitioner

## 2019-01-24 ENCOUNTER — Encounter: Payer: Self-pay | Admitting: Nurse Practitioner

## 2019-01-24 VITALS — BP 128/69 | HR 85 | Temp 99.0°F | Resp 16 | Ht 63.0 in | Wt 187.2 lb

## 2019-01-24 DIAGNOSIS — H9311 Tinnitus, right ear: Secondary | ICD-10-CM | POA: Diagnosis not present

## 2019-01-24 DIAGNOSIS — H6121 Impacted cerumen, right ear: Secondary | ICD-10-CM

## 2019-01-24 NOTE — Progress Notes (Signed)
[facility] [place], [place] [address]  Internal MEDICINE  Office Visit Note  Patient Name: Yvette Humphrey  [id number]  [id number]  Date of Service: 01/27/2019   Pt is here for a sick visit.  Chief Complaint  Patient presents with  . Ear Pain    right ear. ringing. ear feels full     The patient states that she started having discomfort in her right ear yesterday morning. Feels very full and has constant ringing in the right ear. She states this started directly after cleaning her ears out with a q-tip. Similar symptoms have occurred in the past. She states that she had wax build up and it had to be flushed out. She denies ear pain, congestion, headache, or fever.        Current Medication:  Outpatient Encounter Medications as of 01/24/2019  Medication Sig  . ALPRAZolam (XANAX) 0.5 MG tablet Take 1 tablet (0.5 mg total) by mouth daily as needed for anxiety.  . Butalbital-APAP-Caffeine (FIORICET) 50-300-40 MG CAPS Take 1 capsule by mouth as needed. migraines  . cetirizine (ZYRTEC) 10 MG tablet Take 10 mg by mouth daily.  . chlorpheniramine-HYDROcodone (TUSSIONEX PENNKINETIC ER) 10-8 MG/5ML SUER Take 5 mLs by mouth every 12 (twelve) hours as needed for cough.  . cyanocobalamin 500 MCG tablet Take 500 mcg by mouth daily.  Marland Kitchen dicyclomine (BENTYL) 10 MG capsule Take 1 to 2 capsules po TID prn cramping  . ergocalciferol (DRISDOL) 1.25 MG (50000 UT) capsule Take 1 capsule (50,000 Units total) by mouth once a week.  . fluconazole (DIFLUCAN) 150 MG tablet TAKE 1 TABLET BY MOUTH 1 TIME. REPEAT AGAIN IF SYMPTOMS PERSISTS  . Lysine 1000 MG TABS Take by mouth daily as needed.  . Magnesium 250 MG TABS Take by mouth daily.  . meloxicam (MOBIC) 15 MG tablet Take 1 tablet (15 mg total) by mouth daily.  . metoprolol succinate (TOPROL-XL) 50 MG 24 hr tablet Take 1 tablet (50 mg total) by mouth daily. Take with or immediately following a meal.  . POTASSIUM CHLORIDE PO  Take 10 mcg by mouth daily.  . sertraline (ZOLOFT) 100 MG tablet Take 1 tablet (100 mg total) by mouth daily.  Marland Kitchen zolpidem (AMBIEN CR) 12.5 MG CR tablet Take 1 tablet (12.5 mg total) by mouth at bedtime as needed for sleep.  . cyanocobalamin ([employer] VITAMIN B12) 1000 MCG tablet Take by mouth.  . [DISCONTINUED] BIOTIN PO Take by mouth daily.  . [DISCONTINUED] Multiple Vitamins-Minerals (MULTIVITAMIN WOMEN PO) Take by mouth daily.   No facility-administered encounter medications on file as of 01/24/2019.       Medical History: Past Medical History:  Diagnosis Date  . Complication of anesthesia   . Headache    migraines, 1x/mo  . Hypertension   . Motion sickness    back seat cars  . PONV (postoperative nausea and vomiting)   . Tachycardia   . Wears contact lenses      Today's Vitals   01/24/19 1622  BP: 128/69  Pulse: 85  Resp: 16  Temp: 99 F (37.2 C)  SpO2: 99%  Weight: 187 lb 3.2 oz (84.9 kg)  Height: 5' 3"  (1.6 m)   Body mass index is 33.16 kg/m.  Review of Systems  Constitutional: Negative for chills, fatigue and fever.  HENT: Positive for congestion, ear pain, postnasal drip, rhinorrhea and tinnitus. Negative for sinus pain, sore throat and voice change.        Pain and  ringing present in right ear.  Respiratory: Negative for cough and wheezing.   Cardiovascular: Negative for chest pain and palpitations.  Musculoskeletal: Negative.   Skin: Negative.   Allergic/Immunologic: Positive for environmental allergies.  Neurological: Positive for headaches.  Hematological: Negative for adenopathy.    Physical Exam Vitals signs and nursing note reviewed.  Constitutional:      General: She is not in acute distress.    Appearance: Normal appearance. She is well-developed. She is not diaphoretic.  HENT:     Head: Normocephalic and atraumatic.     Right Ear: Tenderness present. There is impacted cerumen. Tympanic membrane is erythematous.     Left Ear: Tympanic membrane  is erythematous.     Mouth/Throat:     Pharynx: No oropharyngeal exudate.  Eyes:     Pupils: Pupils are equal, round, and reactive to light.  Neck:     Musculoskeletal: Normal range of motion and neck supple.     Thyroid: No thyromegaly.     Vascular: No JVD.     Trachea: No tracheal deviation.  Cardiovascular:     Rate and Rhythm: Normal rate and regular rhythm.     Heart sounds: Normal heart sounds. No murmur. No friction rub. No gallop.   Pulmonary:     Effort: Pulmonary effort is normal. No respiratory distress.     Breath sounds: Normal breath sounds. No wheezing or rales.  Chest:     Chest wall: No tenderness.  Musculoskeletal: Normal range of motion.  Lymphadenopathy:     Cervical: No cervical adenopathy.  Skin:    General: Skin is warm and dry.  Neurological:     Mental Status: She is alert and oriented to person, place, and time.     Cranial Nerves: No cranial nerve deficit.  Psychiatric:        Behavior: Behavior normal.        Thought Content: Thought content normal.        Judgment: Judgment normal.   Assessment/Plan:  1. Impacted cerumen of right ear Irrigation of right ear performed. Patient tolerated this well and did have some relief of symptoms after the procedure.  - Ear Lavage  2. Tinnitus of right ear Mild improvement following irrigation procedure. Will continue to monitor.   General Counseling: ashima shrake understanding of the findings of todays visit and agrees with plan of treatment. I have discussed any further diagnostic evaluation that may be needed or ordered today. We also reviewed her medications today. she has been encouraged to call the office with any questions or concerns that should arise related to todays visit.    Counseling:  This patient was seen by Leretha Pol FNP Collaboration with Dr Lavera Guise as a part of collaborative care agreement  Orders Placed This Encounter  Procedures  . Ear Lavage     Time spent: 30  Minutes

## 2019-01-27 DIAGNOSIS — H9311 Tinnitus, right ear: Secondary | ICD-10-CM | POA: Insufficient documentation

## 2019-01-27 DIAGNOSIS — H6121 Impacted cerumen, right ear: Secondary | ICD-10-CM | POA: Insufficient documentation

## 2019-04-18 ENCOUNTER — Ambulatory Visit: Payer: BC Managed Care – PPO | Admitting: Adult Health

## 2019-04-18 ENCOUNTER — Encounter: Payer: Self-pay | Admitting: Adult Health

## 2019-04-18 ENCOUNTER — Other Ambulatory Visit: Payer: Self-pay

## 2019-04-18 VITALS — BP 118/76 | HR 75 | Temp 98.4°F | Resp 16 | Ht 63.0 in | Wt 175.0 lb

## 2019-04-18 DIAGNOSIS — H6122 Impacted cerumen, left ear: Secondary | ICD-10-CM

## 2019-04-18 DIAGNOSIS — I10 Essential (primary) hypertension: Secondary | ICD-10-CM | POA: Diagnosis not present

## 2019-04-18 NOTE — Progress Notes (Signed)
[facility] [place], [place] [address]  Internal MEDICINE  Office Visit Note  Patient Name: Yvette Humphrey  [id number]  [id number]  Date of Service: 04/18/2019  Chief Complaint  Patient presents with  . Ear Pain    left ear pain started last week went away and tuesday morning felt pressure     HPI Pt is here for a sick visit.  Pt reports left ear pain for about a week.  She has increasing pressure in that ear.  She has been cleaning her ears with otc drops.  No fever, or other symptoms.  She reports she has noticed blood on her pillow case.      Current Medication:  Outpatient Encounter Medications as of 04/18/2019  Medication Sig  . ALPRAZolam (XANAX) 0.5 MG tablet Take 1 tablet (0.5 mg total) by mouth daily as needed for anxiety.  . Butalbital-APAP-Caffeine (FIORICET) 50-300-40 MG CAPS Take 1 capsule by mouth as needed. migraines  . cetirizine (ZYRTEC) 10 MG tablet Take 10 mg by mouth daily.  . chlorpheniramine-HYDROcodone (TUSSIONEX PENNKINETIC ER) 10-8 MG/5ML SUER Take 5 mLs by mouth every 12 (twelve) hours as needed for cough.  . cyanocobalamin 500 MCG tablet Take 500 mcg by mouth daily.  Marland Kitchen dicyclomine (BENTYL) 10 MG capsule Take 1 to 2 capsules po TID prn cramping  . ergocalciferol (DRISDOL) 1.25 MG (50000 UT) capsule Take 1 capsule (50,000 Units total) by mouth once a week.  . fluconazole (DIFLUCAN) 150 MG tablet TAKE 1 TABLET BY MOUTH 1 TIME. REPEAT AGAIN IF SYMPTOMS PERSISTS  . Lysine 1000 MG TABS Take by mouth daily as needed.  . Magnesium 250 MG TABS Take by mouth daily.  . meloxicam (MOBIC) 15 MG tablet Take 1 tablet (15 mg total) by mouth daily.  . metoprolol succinate (TOPROL-XL) 50 MG 24 hr tablet Take 1 tablet (50 mg total) by mouth daily. Take with or immediately following a meal.  . POTASSIUM CHLORIDE PO Take 10 mcg by mouth daily.  . sertraline (ZOLOFT) 100 MG tablet Take 1 tablet (100 mg total) by mouth daily.  Marland Kitchen zolpidem (AMBIEN  CR) 12.5 MG CR tablet Take 1 tablet (12.5 mg total) by mouth at bedtime as needed for sleep.  . cyanocobalamin ([employer]S VITAMIN B12) 1000 MCG tablet Take by mouth.   No facility-administered encounter medications on file as of 04/18/2019.       Medical History: Past Medical History:  Diagnosis Date  . Complication of anesthesia   . Headache    migraines, 1x/mo  . Hypertension   . Motion sickness    back seat cars  . PONV (postoperative nausea and vomiting)   . Tachycardia   . Wears contact lenses      Vital Signs: BP 118/76   Pulse 75   Temp 98.4 F (36.9 C)   Resp 16   Ht 5' 3"  (1.6 m)   Wt 175 lb (79.4 kg)   SpO2 99%   BMI 31.00 kg/m    Review of Systems  Constitutional: Negative for chills, fatigue and unexpected weight change.  HENT: Positive for ear discharge and ear pain. Negative for congestion, rhinorrhea, sneezing and sore throat.   Eyes: Negative for photophobia, pain and redness.  Respiratory: Negative for cough, chest tightness and shortness of breath.   Cardiovascular: Negative for chest pain and palpitations.  Gastrointestinal: Negative for abdominal pain, constipation, diarrhea, nausea and vomiting.  Endocrine: Negative.   Genitourinary: Negative for dysuria and frequency.  Musculoskeletal:  Negative for arthralgias, back pain, joint swelling and neck pain.  Skin: Negative for rash.  Allergic/Immunologic: Negative.   Neurological: Negative for tremors and numbness.  Hematological: Negative for adenopathy. Does not bruise/bleed easily.  Psychiatric/Behavioral: Negative for behavioral problems and sleep disturbance. The patient is not nervous/anxious.     Physical Exam Vitals signs and nursing note reviewed.  Constitutional:      General: She is not in acute distress.    Appearance: She is well-developed. She is not diaphoretic.  HENT:     Head: Normocephalic and atraumatic.     Left Ear: There is impacted cerumen.     Mouth/Throat:     Pharynx: No  oropharyngeal exudate.  Eyes:     Pupils: Pupils are equal, round, and reactive to light.  Neck:     Musculoskeletal: Normal range of motion and neck supple.     Thyroid: No thyromegaly.     Vascular: No JVD.     Trachea: No tracheal deviation.  Cardiovascular:     Rate and Rhythm: Normal rate and regular rhythm.     Heart sounds: Normal heart sounds. No murmur. No friction rub. No gallop.   Pulmonary:     Effort: Pulmonary effort is normal. No respiratory distress.     Breath sounds: Normal breath sounds. No wheezing or rales.  Chest:     Chest wall: No tenderness.  Abdominal:     Palpations: Abdomen is soft.     Tenderness: There is no abdominal tenderness. There is no guarding.  Musculoskeletal: Normal range of motion.  Lymphadenopathy:     Cervical: No cervical adenopathy.  Skin:    General: Skin is warm and dry.  Neurological:     Mental Status: She is alert and oriented to person, place, and time.     Cranial Nerves: No cranial nerve deficit.  Psychiatric:        Behavior: Behavior normal.        Thought Content: Thought content normal.        Judgment: Judgment normal.    Assessment/Plan: 1. Impacted cerumen of left ear Ear lavage performed, canal clear after lavage.  No redness or inflammation noted.  - Ear Lavage  2. Essential (primary) hypertension Currently well controlled. Continue therapy.   General Counseling: cathlin buchan understanding of the findings of todays visit and agrees with plan of treatment. I have discussed any further diagnostic evaluation that may be needed or ordered today. We also reviewed her medications today. she has been encouraged to call the office with any questions or concerns that should arise related to todays visit.   No orders of the defined types were placed in this encounter.   No orders of the defined types were placed in this encounter.   Time spent: 20 Minutes  This patient was seen by Orson Gear AGNP-C in  Collaboration with Dr Lavera Guise as a part of collaborative care agreement.  Kendell Bane AGNP-C Internal Medicine

## 2019-04-30 ENCOUNTER — Other Ambulatory Visit: Payer: Self-pay | Admitting: Nurse Practitioner

## 2019-04-30 ENCOUNTER — Ambulatory Visit: Payer: Self-pay | Admitting: Nurse Practitioner

## 2019-04-30 DIAGNOSIS — F5101 Primary insomnia: Secondary | ICD-10-CM

## 2019-04-30 MED ORDER — ZOLPIDEM TARTRATE ER 12.5 MG PO TBCR: 12.5000 mg | EXTENDED_RELEASE_TABLET | Freq: Every evening | ORAL | 3 refills | Status: DC | PRN

## 2019-04-30 NOTE — Progress Notes (Signed)
Refilled zolpidem 12.50m QHS prn per pharmacy request.

## 2019-05-08 ENCOUNTER — Ambulatory Visit: Payer: BC Managed Care – PPO | Admitting: Adult Health

## 2019-05-08 ENCOUNTER — Encounter: Payer: Self-pay | Admitting: Adult Health

## 2019-05-08 ENCOUNTER — Other Ambulatory Visit: Payer: Self-pay

## 2019-05-08 VITALS — Ht 63.0 in | Wt 170.0 lb

## 2019-05-08 DIAGNOSIS — Z20828 Contact with and (suspected) exposure to other viral communicable diseases: Secondary | ICD-10-CM | POA: Diagnosis not present

## 2019-05-08 DIAGNOSIS — I10 Essential (primary) hypertension: Secondary | ICD-10-CM

## 2019-05-08 DIAGNOSIS — Z20822 Contact with and (suspected) exposure to covid-19: Secondary | ICD-10-CM

## 2019-05-08 NOTE — Progress Notes (Signed)
[facility] [place], [place] [address]  Internal MEDICINE  Telephone Visit  Patient Name: Yvette Humphrey  [id number]  [id number]  Date of Service: 05/08/2019  I connected with the patient at 224 by telephone and verified the patients identity using two identifiers.   I discussed the limitations, risks, security and privacy concerns of performing an evaluation and management service by telephone and the availability of in person appointments. I also discussed with the patient that there may be a patient responsible charge related to the service.  The patient expressed understanding and agrees to proceed.    Chief Complaint  Patient presents with  . Telephone Assessment    exposure to covid  . Telephone Screen    HPI  PT reports she has had head congestion, sore throat, mild cough, and decreased sense of taste.  Her daughter tested positive for covid, yesterday.  She would like to be tested at this time.  Overall she does not feel ill, but she wants to be safe.     Current Medication: Outpatient Encounter Medications as of 05/08/2019  Medication Sig  . ALPRAZolam (XANAX) 0.5 MG tablet Take 1 tablet (0.5 mg total) by mouth daily as needed for anxiety.  . Butalbital-APAP-Caffeine (FIORICET) 50-300-40 MG CAPS Take 1 capsule by mouth as needed. migraines  . cetirizine (ZYRTEC) 10 MG tablet Take 10 mg by mouth daily.  . chlorpheniramine-HYDROcodone (TUSSIONEX PENNKINETIC ER) 10-8 MG/5ML SUER Take 5 mLs by mouth every 12 (twelve) hours as needed for cough.  . cyanocobalamin 500 MCG tablet Take 500 mcg by mouth daily.  Marland Kitchen dicyclomine (BENTYL) 10 MG capsule Take 1 to 2 capsules po TID prn cramping  . ergocalciferol (DRISDOL) 1.25 MG (50000 UT) capsule Take 1 capsule (50,000 Units total) by mouth once a week.  . fluconazole (DIFLUCAN) 150 MG tablet TAKE 1 TABLET BY MOUTH 1 TIME. REPEAT AGAIN IF SYMPTOMS PERSISTS  . Lysine 1000 MG TABS Take by mouth daily as needed.  .  Magnesium 250 MG TABS Take by mouth daily.  . meloxicam (MOBIC) 15 MG tablet Take 1 tablet (15 mg total) by mouth daily.  . metoprolol succinate (TOPROL-XL) 50 MG 24 hr tablet Take 1 tablet (50 mg total) by mouth daily. Take with or immediately following a meal.  . POTASSIUM CHLORIDE PO Take 10 mcg by mouth daily.  . sertraline (ZOLOFT) 100 MG tablet Take 1 tablet (100 mg total) by mouth daily.  Marland Kitchen zolpidem (AMBIEN CR) 12.5 MG CR tablet Take 1 tablet (12.5 mg total) by mouth at bedtime as needed for sleep.  . cyanocobalamin ([employer]S VITAMIN B12) 1000 MCG tablet Take by mouth.   No facility-administered encounter medications on file as of 05/08/2019.     Surgical History: Past Surgical History:  Procedure Laterality Date  . CHOLECYSTECTOMY    . ENDOMETRIAL ABLATION     approx 2009  . ESOPHAGOGASTRODUODENOSCOPY    . REDUCTION MAMMAPLASTY Bilateral 2004  . TONSILLECTOMY    . TUBAL LIGATION    . ULNAR NERVE TRANSPOSITION Left 06/08/2016   Procedure: SUBCUTANEOUS ANTERIOR ULNAR NERVE TRANSPOSITION LEFT ELBOW;  Surgeon: Corky Mull, MD;  Location: [facility];  Service: Orthopedics;  Laterality: Left;    Medical History: Past Medical History:  Diagnosis Date  . Complication of anesthesia   . Headache    migraines, 1x/mo  . Hypertension   . Motion sickness    back seat cars  . PONV (postoperative nausea and vomiting)   .  Tachycardia   . Wears contact lenses     Family History: Family History  Problem Relation Age of Onset  . Breast cancer Paternal Grandmother     Social History   Socioeconomic History  . Marital status: Married    Spouse name: Not on file  . Number of children: Not on file  . Years of education: Not on file  . Highest education level: Not on file  Occupational History  . Not on file  Social Needs  . Financial resource strain: Not on file  . Food insecurity    Worry: Not on file    Inability: Not on file  . Transportation needs    Medical: Not  on file    Non-medical: Not on file  Tobacco Use  . Smoking status: Never Smoker  . Smokeless tobacco: Never Used  Substance and Sexual Activity  . Alcohol use: Yes    Alcohol/week: 1.0 standard drinks    Types: 1 Glasses of wine per week    Comment: occasionally  . Drug use: No  . Sexual activity: Not on file  Lifestyle  . Physical activity    Days per week: Not on file    Minutes per session: Not on file  . Stress: Not on file  Relationships  . Social Herbalist on phone: Not on file    Gets together: Not on file    Attends religious service: Not on file    Active member of club or organization: Not on file    Attends meetings of clubs or organizations: Not on file    Relationship status: Not on file  . Intimate partner violence    Fear of current or ex partner: Not on file    Emotionally abused: Not on file    Physically abused: Not on file    Forced sexual activity: Not on file  Other Topics Concern  . Not on file  Social History Narrative  . Not on file      Review of Systems  Constitutional: Negative for chills, fatigue and unexpected weight change.  HENT: Negative for congestion, rhinorrhea, sneezing and sore throat.   Eyes: Negative for photophobia, pain and redness.  Respiratory: Negative for cough, chest tightness and shortness of breath.   Cardiovascular: Negative for chest pain and palpitations.  Gastrointestinal: Negative for abdominal pain, constipation, diarrhea, nausea and vomiting.  Endocrine: Negative.   Genitourinary: Negative for dysuria and frequency.  Musculoskeletal: Negative for arthralgias, back pain, joint swelling and neck pain.  Skin: Negative for rash.  Allergic/Immunologic: Negative.   Neurological: Negative for tremors and numbness.  Hematological: Negative for adenopathy. Does not bruise/bleed easily.  Psychiatric/Behavioral: Negative for behavioral problems and sleep disturbance. The patient is not nervous/anxious.      Vital Signs: Ht 5' 3"  (1.6 m)   Wt 170 lb (77.1 kg)   BMI 30.11 kg/m    Observation/Objective:  Well appearing, nad noted.    Assessment/Plan: 1. Close Exposure to Covid-19 Virus Will get testing, and patient will follow up if symptoms fail to improve.  - Novel Coronavirus, NAA ([facility])  2. Essential (primary) hypertension Stable, continue present management.   General Counseling: tennelle taflinger understanding of the findings of today's phone visit and agrees with plan of treatment. I have discussed any further diagnostic evaluation that may be needed or ordered today. We also reviewed her medications today. she has been encouraged to call the office with any questions or concerns that should arise  related to todays visit.    No orders of the defined types were placed in this encounter.   No orders of the defined types were placed in this encounter.   Time spent: [place] AGNP-C Internal medicine

## 2019-05-09 ENCOUNTER — Encounter: Payer: Self-pay | Admitting: Nurse Practitioner

## 2019-05-09 LAB — NOVEL CORONAVIRUS, NAA: SARS-CoV-2, NAA: NOT DETECTED

## 2019-05-13 ENCOUNTER — Other Ambulatory Visit: Payer: Self-pay

## 2019-05-13 DIAGNOSIS — Z20822 Contact with and (suspected) exposure to covid-19: Secondary | ICD-10-CM

## 2019-05-15 LAB — NOVEL CORONAVIRUS, NAA: SARS-CoV-2, NAA: NOT DETECTED

## 2019-05-28 ENCOUNTER — Other Ambulatory Visit: Payer: Self-pay | Admitting: Nurse Practitioner

## 2019-05-28 DIAGNOSIS — I10 Essential (primary) hypertension: Secondary | ICD-10-CM

## 2019-05-28 MED ORDER — METOPROLOL SUCCINATE ER 50 MG PO TB24: 50.0000 mg | ORAL_TABLET | Freq: Every day | ORAL | 3 refills | Status: DC

## 2019-06-07 ENCOUNTER — Ambulatory Visit (INDEPENDENT_AMBULATORY_CARE_PROVIDER_SITE_OTHER): Payer: BC Managed Care – PPO | Admitting: Nurse Practitioner

## 2019-06-07 ENCOUNTER — Other Ambulatory Visit: Payer: Self-pay

## 2019-06-07 ENCOUNTER — Encounter: Payer: Self-pay | Admitting: Nurse Practitioner

## 2019-06-07 VITALS — BP 142/81 | HR 88 | Temp 97.7°F | Resp 16 | Ht 63.0 in | Wt 173.0 lb

## 2019-06-07 DIAGNOSIS — M064 Inflammatory polyarthropathy: Secondary | ICD-10-CM

## 2019-06-07 DIAGNOSIS — Z0001 Encounter for general adult medical examination with abnormal findings: Secondary | ICD-10-CM | POA: Diagnosis not present

## 2019-06-07 DIAGNOSIS — R5383 Other fatigue: Secondary | ICD-10-CM

## 2019-06-07 DIAGNOSIS — R300 Dysuria: Secondary | ICD-10-CM

## 2019-06-07 DIAGNOSIS — Z79899 Other long term (current) drug therapy: Secondary | ICD-10-CM | POA: Diagnosis not present

## 2019-06-07 DIAGNOSIS — F411 Generalized anxiety disorder: Secondary | ICD-10-CM

## 2019-06-07 DIAGNOSIS — R002 Palpitations: Secondary | ICD-10-CM

## 2019-06-07 DIAGNOSIS — Z124 Encounter for screening for malignant neoplasm of cervix: Secondary | ICD-10-CM

## 2019-06-07 DIAGNOSIS — F5101 Primary insomnia: Secondary | ICD-10-CM

## 2019-06-07 DIAGNOSIS — Z1231 Encounter for screening mammogram for malignant neoplasm of breast: Secondary | ICD-10-CM

## 2019-06-07 LAB — POCT URINE DRUG SCREEN
POC Amphetamine UR: NOT DETECTED
POC BENZODIAZEPINES UR: POSITIVE — AB
POC Barbiturate UR: POSITIVE — AB
POC Cocaine UR: NOT DETECTED
POC Ecstasy UR: NOT DETECTED
POC Marijuana UR: NOT DETECTED
POC Methadone UR: NOT DETECTED
POC Methamphetamine UR: NOT DETECTED
POC Opiate Ur: NOT DETECTED
POC Oxycodone UR: NOT DETECTED
POC PHENCYCLIDINE UR: NOT DETECTED
POC TRICYCLICS UR: NOT DETECTED

## 2019-06-07 MED ORDER — ALPRAZOLAM 0.5 MG PO TABS: 0.5000 mg | ORAL_TABLET | Freq: Every day | ORAL | 3 refills | Status: DC | PRN

## 2019-06-07 MED ORDER — MELOXICAM 15 MG PO TABS: 15.0000 mg | ORAL_TABLET | Freq: Every day | ORAL | 5 refills | Status: DC

## 2019-06-07 MED ORDER — ESZOPICLONE 3 MG PO TABS: 3.0000 mg | ORAL_TABLET | Freq: Every day | ORAL | 3 refills | Status: DC

## 2019-06-07 NOTE — Progress Notes (Signed)
[facility] [place], [place] [address]  Internal MEDICINE  Office Visit Note  Patient Name: Yvette Humphrey  [id number]  [id number]  Date of Service: 06/23/2019   Pt is here for routine health maintenance examination  Chief Complaint  Patient presents with  . Annual Exam  . Hypertension     The patient is here for health maintenance exam with pap smear.  - difficulty with sleep. Change in ambien to CR has been ineffective.  - palpitations - intermittently. unassociated with exertion. Has woken her up from sleep. Believes it to be due to anxiety. Has had some increased levels of stress. Denies shortness of breath.     Current Medication: Outpatient Encounter Medications as of 06/07/2019  Medication Sig  . ALPRAZolam (XANAX) 0.5 MG tablet Take 1 tablet (0.5 mg total) by mouth daily as needed for anxiety.  . Butalbital-APAP-Caffeine (FIORICET) 50-300-40 MG CAPS Take 1 capsule by mouth as needed. migraines  . cetirizine (ZYRTEC) 10 MG tablet Take 10 mg by mouth daily.  . chlorpheniramine-HYDROcodone (TUSSIONEX PENNKINETIC ER) 10-8 MG/5ML SUER Take 5 mLs by mouth every 12 (twelve) hours as needed for cough.  . cyanocobalamin 500 MCG tablet Take 500 mcg by mouth daily.  Marland Kitchen dicyclomine (BENTYL) 10 MG capsule Take 1 to 2 capsules po TID prn cramping  . ergocalciferol (DRISDOL) 1.25 MG (50000 UT) capsule Take 1 capsule (50,000 Units total) by mouth once a week.  . fluconazole (DIFLUCAN) 150 MG tablet TAKE 1 TABLET BY MOUTH 1 TIME. REPEAT AGAIN IF SYMPTOMS PERSISTS  . Lysine 1000 MG TABS Take by mouth daily as needed.  . Magnesium 250 MG TABS Take by mouth daily.  . meloxicam (MOBIC) 15 MG tablet Take 1 tablet (15 mg total) by mouth daily.  . metoprolol succinate (TOPROL-XL) 50 MG 24 hr tablet Take 1 tablet (50 mg total) by mouth daily. Take with or immediately following a meal.  . POTASSIUM CHLORIDE PO Take 10 mcg by mouth daily.  . sertraline (ZOLOFT) 100 MG  tablet Take 1 tablet (100 mg total) by mouth daily.  Marland Kitchen zolpidem (AMBIEN CR) 12.5 MG CR tablet Take 1 tablet (12.5 mg total) by mouth at bedtime as needed for sleep.  . [DISCONTINUED] ALPRAZolam (XANAX) 0.5 MG tablet Take 1 tablet (0.5 mg total) by mouth daily as needed for anxiety.  . [DISCONTINUED] meloxicam (MOBIC) 15 MG tablet Take 1 tablet (15 mg total) by mouth daily.  . cyanocobalamin ([employer] VITAMIN B12) 1000 MCG tablet Take by mouth.  . Eszopiclone 3 MG TABS Take 1 tablet (3 mg total) by mouth at bedtime. Take immediately before bedtime   No facility-administered encounter medications on file as of 06/07/2019.     Surgical History: Past Surgical History:  Procedure Laterality Date  . CHOLECYSTECTOMY    . ENDOMETRIAL ABLATION     approx 2009  . ESOPHAGOGASTRODUODENOSCOPY    . REDUCTION MAMMAPLASTY Bilateral 2004  . TONSILLECTOMY    . TUBAL LIGATION    . ULNAR NERVE TRANSPOSITION Left 06/08/2016   Procedure: SUBCUTANEOUS ANTERIOR ULNAR NERVE TRANSPOSITION LEFT ELBOW;  Surgeon: Corky Mull, MD;  Location: [place];  Service: Orthopedics;  Laterality: Left;    Medical History: Past Medical History:  Diagnosis Date  . Complication of anesthesia   . Headache    migraines, 1x/mo  . Hypertension   . Motion sickness    back seat cars  . PONV (postoperative nausea and vomiting)   . Tachycardia   .  Wears contact lenses     Family History: Family History  Problem Relation Age of Onset  . Breast cancer Paternal Grandmother       Review of Systems  Constitutional: Negative for appetite change, chills, fatigue and unexpected weight change.  HENT: Negative for congestion, postnasal drip, rhinorrhea, sneezing and sore throat.   Respiratory: Negative for cough, chest tightness, shortness of breath and wheezing.   Cardiovascular: Positive for palpitations. Negative for chest pain.       Palpitations are intermittent.  Gastrointestinal: Negative for abdominal  distention, abdominal pain, constipation, diarrhea, nausea and vomiting.  Endocrine: Negative for cold intolerance, heat intolerance, polydipsia and polyuria.  Genitourinary: Negative for dysuria, frequency, hematuria and urgency.  Musculoskeletal: Positive for back pain. Negative for arthralgias, joint swelling and neck pain.  Skin: Negative for rash.  Allergic/Immunologic: Negative for environmental allergies.  Neurological: Negative for dizziness, tremors, numbness and headaches.  Hematological: Negative for adenopathy. Does not bruise/bleed easily.  Psychiatric/Behavioral: Positive for dysphoric mood. Negative for behavioral problems (Depression), sleep disturbance and suicidal ideas. The patient is nervous/anxious.      Today's Vitals   06/07/19 1521  BP: (!) 142/81  Pulse: 88  Resp: 16  Temp: 97.7 F (36.5 C)  SpO2: 99%  Weight: 173 lb (78.5 kg)  Height: 5' 3"  (1.6 m)   Body mass index is 30.65 kg/m.  Physical Exam Vitals signs and nursing note reviewed.  Constitutional:      General: She is not in acute distress.    Appearance: Normal appearance. She is well-developed. She is not diaphoretic.  HENT:     Head: Normocephalic and atraumatic.     Nose: Nose normal.     Mouth/Throat:     Pharynx: No oropharyngeal exudate.  Eyes:     Extraocular Movements: Extraocular movements intact.     Pupils: Pupils are equal, round, and reactive to light.  Neck:     Musculoskeletal: Normal range of motion and neck supple.     Thyroid: No thyromegaly.     Vascular: No JVD.     Trachea: No tracheal deviation.  Cardiovascular:     Rate and Rhythm: Normal rate and regular rhythm.     Pulses: Normal pulses.     Heart sounds: Normal heart sounds. No murmur. No friction rub. No gallop.   Pulmonary:     Effort: Pulmonary effort is normal. No respiratory distress.     Breath sounds: Normal breath sounds. No wheezing or rales.  Chest:     Chest wall: No tenderness.     Breasts:         Right: Normal. No swelling, bleeding, inverted nipple, mass, nipple discharge, skin change or tenderness.        Left: Normal. No swelling, bleeding, inverted nipple, mass, nipple discharge, skin change or tenderness.  Abdominal:     General: Bowel sounds are normal.     Palpations: Abdomen is soft.     Tenderness: There is no abdominal tenderness.     Comments: Mild, generalized abdominal tenderness with palpation.   Genitourinary:    General: Normal vulva.     Exam position: Supine.     Labia:        Right: No tenderness or lesion.        Left: No tenderness or lesion.      Vagina: Normal. No vaginal discharge, erythema or tenderness.     Cervix: No cervical motion tenderness, discharge, friability, lesion or erythema.  Uterus: Normal. Not enlarged.      Adnexa: Right adnexa normal and left adnexa normal.     Comments: No tenderness, masses, or organomeglay present during bimanual exam . Musculoskeletal: Normal range of motion.  Lymphadenopathy:     Cervical: No cervical adenopathy.     Upper Body:     Right upper body: No axillary adenopathy.     Left upper body: No axillary adenopathy.     Lower Body: No right inguinal adenopathy. No left inguinal adenopathy.  Skin:    General: Skin is warm and dry.  Neurological:     Mental Status: She is alert and oriented to person, place, and time.     Cranial Nerves: No cranial nerve deficit.  Psychiatric:        Behavior: Behavior normal.        Thought Content: Thought content normal.        Judgment: Judgment normal.      LABS: Recent Results (from the past 2160 hour(s))  Novel Coronavirus, NAA ([facility])     Status: None   Collection Time: 05/08/19 12:00 AM   Specimen: Oropharyngeal(OP) collection in vial transport medium   OROPHARYNGEA  TESTING  Result Value Ref Range   SARS-CoV-2, NAA Not Detected Not Detected    Comment: Testing was performed using the cobas(R) SARS-CoV-2 test. This nucleic acid amplification test  was developed and its performance characteristics determined by [employer]. Nucleic acid amplification tests include PCR and TMA. This test has not been FDA cleared or approved. This test has been authorized by FDA under an Emergency Use Authorization (EUA). This test is only authorized for the duration of time the declaration that circumstances exist justifying the authorization of the emergency use of in vitro diagnostic tests for detection of SARS-CoV-2 virus and/or diagnosis of COVID-19 infection under section 564(b)(1) of the Act, 21 U.S.C. 034VQQ-5(Z) (1), unless the authorization is terminated or revoked sooner. When diagnostic testing is negative, the possibility of a false negative result should be considered in the context of a patient's recent exposures and the presence of clinical signs and symptoms consistent with COVID-19. An individual without symptoms  of COVID-19 and who is not shedding SARS-CoV-2 virus would expect to have a negative (not detected) result in this assay.   Novel Coronavirus, NAA ([facility])     Status: None   Collection Time: 05/13/19 12:00 AM   Specimen: Oropharyngeal(OP) collection in vial transport medium   OROPHARYNGEA  TESTING  Result Value Ref Range   SARS-CoV-2, NAA Not Detected Not Detected    Comment: This nucleic acid amplification test was developed and its performance characteristics determined by [employer]. Nucleic acid amplification tests include PCR and TMA. This test has not been FDA cleared or approved. This test has been authorized by FDA under an Emergency Use Authorization (EUA). This test is only authorized for the duration of time the declaration that circumstances exist justifying the authorization of the emergency use of in vitro diagnostic tests for detection of SARS-CoV-2 virus and/or diagnosis of COVID-19 infection under section 564(b)(1) of the Act, 21 U.S.C. 563OVF-6(E) (1), unless the authorization is  terminated or revoked sooner. When diagnostic testing is negative, the possibility of a false negative result should be considered in the context of a patient's recent exposures and the presence of clinical signs and symptoms consistent with COVID-19. An individual without symptoms of COVID-19 and who is not shedding SARS-CoV-2 virus would  expect to have a negative (not detected) result in  this assay.   Pap IG and HPV (high risk) DNA detection     Status: None   Collection Time: 06/07/19 12:00 AM  Result Value Ref Range   Interpretation NILM,QC     Comment: NEGATIVE FOR INTRAEPITHELIAL LESION OR MALIGNANCY. THIS SPECIMEN WAS RESCREENED AS PART OF OUR QUALITY CONTROL PROGRAM.    Category NIL     Comment: Negative for Intraepithelial Lesion   Infection FOC     Comment: FUNGAL ORGANISMS MORPHOLOGICALLY CONSISTENT WITH CANDIDA SPECIES ARE PRESENT.    Adequacy SECNI,AOCX     Comment: Satisfactory for evaluation. No endocervical component is identified. The absence of an endocervical component was confirmed by an additional screening evaluation.    Clinician Provided ICD10 Comment     Comment: Z12.4   Performed by: Comment     Comment: Nori Riis, Cytotechnologist (ASCP)   QC reviewed by: Comment     Comment: Ranae Palms, Supervisory Cytotechnologist (ASCP)   Note: Comment     Comment: The Pap smear is a screening test designed to aid in the detection of premalignant and malignant conditions of the uterine cervix.  It is not a diagnostic procedure and should not be used as the sole means of detecting cervical cancer.  Both false-positive and false-negative reports do occur.    Test Methodology Comment     Comment: This liquid based ThinPrep(R) pap test was screened with the use of an image guided system.    HPV, high-risk Negative Negative    Comment: This nucleic acid amplification high-risk HPV test detects thirteen high-risk types  (16,18,31,33,35,39,45,51,52,56,58,59,68) without differentiation.   UA/M w/rflx Culture, Routine     Status: None   Collection Time: 06/07/19  2:50 PM   Specimen: Urine   URINE  Result Value Ref Range   Specific Gravity, UA 1.018 1.005 - 1.030   pH, UA 5.5 5.0 - 7.5   Color, UA Yellow Yellow   Appearance Ur Clear Clear   Leukocytes,UA Negative Negative   Protein,UA Negative Negative/Trace   Glucose, UA Negative Negative   Ketones, UA Negative Negative   RBC, UA Negative Negative   Bilirubin, UA Negative Negative   Urobilinogen, Ur 0.2 0.2 - 1.0 mg/dL   Nitrite, UA Negative Negative   Microscopic Examination Comment     Comment: Microscopic follows if indicated.   Microscopic Examination See below:     Comment: Microscopic was indicated and was performed.   Urinalysis Reflex Comment     Comment: This specimen will not reflex to a Urine Culture.  Microscopic Examination     Status: None   Collection Time: 06/07/19  2:50 PM   URINE  Result Value Ref Range   WBC, UA 0-5 0 - 5 /hpf   RBC 0-2 0 - 2 /hpf   Epithelial Cells (non renal) 0-10 0 - 10 /hpf   Casts None seen None seen /lpf   Mucus, UA Present Not Estab.   Bacteria, UA Few None seen/Few  POCT Urine Drug Screen     Status: Abnormal   Collection Time: 06/07/19  3:55 PM  Result Value Ref Range   POC METHAMPHETAMINE UR None Detected None Detected   POC Opiate Ur None Detected None Detected   POC Barbiturate UR Positive (A) None Detected   POC Amphetamine UR None Detected None Detected   POC Oxycodone UR None Detected None Detected   POC Cocaine UR None Detected None Detected   POC Ecstasy UR None Detected None Detected   POC TRICYCLICS  UR None Detected None Detected   POC PHENCYCLIDINE UR None Detected None Detected   POC MARIJUANA UR None Detected None Detected   POC METHADONE UR None Detected None Detected   POC BENZODIAZEPINES UR Positive (A) None Detected   URINE TEMPERATURE     POC DRUG SCREEN OXIDANTS URINE      POC SPECIFIC GRAVITY URINE     POC PH URINE     Methylenedioxyamphetamine      Assessment/Plan: 1. Encounter for general adult medical examination with abnormal findings Annual health maintenance exam with pap smear today.   2. Palpitation Check labs with electrolytes and thyroid panel.   3. Fatigue, unspecified type Check labs with electrolytes and thyroid panel.   4. Inflammatory polyarthropathy of multiple sites [facility]) May take meloxicam 100m daily if needed for pain/inflammation  - meloxicam (MOBIC) 15 MG tablet; Take 1 tablet (15 mg total) by mouth daily.  Dispense: 30 tablet; Refill: 5  5. GAD (generalized anxiety disorder) May take alprazolam 0.518mdaily if needed for acute anxiety. New prescription sent to her pharmacy.  - ALPRAZolam (XANAX) 0.5 MG tablet; Take 1 tablet (0.5 mg total) by mouth daily as needed for anxiety.  Dispense: 30 tablet; Refill: 3  6. Primary insomnia Trial of lunesta 30m330mt bedtime as needed for insmonia.  - Eszopiclone 3 MG TABS; Take 1 tablet (3 mg total) by mouth at bedtime. Take immediately before bedtime  Dispense: 30 tablet; Refill: 3  7. Routine cervical smear - Pap IG and HPV (high risk) DNA detection  8. Encounter for long-term (current) use of medications - POCT Urine Drug Screen positive for barbituates and BZO.   9. Encounter for screening mammogram for malignant neoplasm of breast - MM DIGITAL SCREENING BILATERAL; Future  10. Dysuria - UA/M w/rflx Culture, Routine  General Counseling: Sustenley winwardderstanding of the findings of todays visit and agrees with plan of treatment. I have discussed any further diagnostic evaluation that may be needed or ordered today. We also reviewed her medications today. she has been encouraged to call the office with any questions or concerns that should arise related to todays visit.    Counseling:  This patient was seen by HeaLeretha PolP Collaboration with Dr FozLavera Guise a part  of collaborative care agreement  Orders Placed This Encounter  Procedures  . Microscopic Examination  . MM DIGITAL SCREENING BILATERAL  . UA/M w/rflx Culture, Routine  . POCT Urine Drug Screen    Meds ordered this encounter  Medications  . Eszopiclone 3 MG TABS    Sig: Take 1 tablet (3 mg total) by mouth at bedtime. Take immediately before bedtime    Dispense:  30 tablet    Refill:  3    D/c zolpidem CR - ineffective    Order Specific Question:   Supervising Provider    Answer:   KHALavera Guise4Aloha ALPRAZolam (XANAX) 0.5 MG tablet    Sig: Take 1 tablet (0.5 mg total) by mouth daily as needed for anxiety.    Dispense:  30 tablet    Refill:  3    Order Specific Question:   Supervising Provider    Answer:   KHALavera Guise4[[id number]] meloxicam (MOBIC) 15 MG tablet    Sig: Take 1 tablet (15 mg total) by mouth daily.    Dispense:  30 tablet    Refill:  5    Order Specific Question:   Supervising Provider  Answer:   Lavera Guise [[id number]]    Time spent: [place], MD  Internal Medicine

## 2019-06-08 LAB — UA/M W/RFLX CULTURE, ROUTINE
Bilirubin, UA: NEGATIVE
Glucose, UA: NEGATIVE
Ketones, UA: NEGATIVE
Leukocytes,UA: NEGATIVE
Nitrite, UA: NEGATIVE
Protein,UA: NEGATIVE
RBC, UA: NEGATIVE
Specific Gravity, UA: 1.018 (ref 1.005–1.030)
Urobilinogen, Ur: 0.2 mg/dL (ref 0.2–1.0)
pH, UA: 5.5 (ref 5.0–7.5)

## 2019-06-08 LAB — MICROSCOPIC EXAMINATION: Casts: NONE SEEN /lpf

## 2019-06-17 LAB — PAP IG AND HPV HIGH-RISK: HPV, high-risk: NEGATIVE

## 2019-06-17 NOTE — Progress Notes (Signed)
Please let the patient know that pap smear was normal. Thanks

## 2019-06-18 ENCOUNTER — Telehealth: Payer: Self-pay

## 2019-06-18 NOTE — Telephone Encounter (Signed)
-----   Message from Ronnell Freshwater, NP sent at 06/17/2019  4:56 PM EST ----- Please let the patient know that pap smear was normal. Thanks

## 2019-06-18 NOTE — Telephone Encounter (Signed)
Pt was notified.  

## 2019-06-23 DIAGNOSIS — Z1231 Encounter for screening mammogram for malignant neoplasm of breast: Secondary | ICD-10-CM | POA: Insufficient documentation

## 2019-06-23 DIAGNOSIS — R002 Palpitations: Secondary | ICD-10-CM | POA: Insufficient documentation

## 2019-06-23 DIAGNOSIS — Z0001 Encounter for general adult medical examination with abnormal findings: Secondary | ICD-10-CM | POA: Insufficient documentation

## 2019-06-23 DIAGNOSIS — Z124 Encounter for screening for malignant neoplasm of cervix: Secondary | ICD-10-CM | POA: Insufficient documentation

## 2019-06-23 DIAGNOSIS — M064 Inflammatory polyarthropathy: Secondary | ICD-10-CM | POA: Insufficient documentation

## 2019-06-24 ENCOUNTER — Other Ambulatory Visit: Payer: Self-pay | Admitting: Internal Medicine

## 2019-06-24 DIAGNOSIS — F411 Generalized anxiety disorder: Secondary | ICD-10-CM

## 2019-06-24 MED ORDER — SERTRALINE HCL 100 MG PO TABS: 100.0000 mg | ORAL_TABLET | Freq: Every day | ORAL | 3 refills | Status: DC

## 2019-07-09 ENCOUNTER — Ambulatory Visit (INDEPENDENT_AMBULATORY_CARE_PROVIDER_SITE_OTHER): Payer: BC Managed Care – PPO | Admitting: Adult Health

## 2019-07-09 ENCOUNTER — Other Ambulatory Visit: Payer: Self-pay | Admitting: Adult Health

## 2019-07-09 ENCOUNTER — Other Ambulatory Visit: Payer: Self-pay

## 2019-07-09 ENCOUNTER — Ambulatory Visit
Admission: RE | Admit: 2019-07-09 | Discharge: 2019-07-09 | Disposition: A | Discharge status: Home / Self Care | Payer: BC Managed Care – PPO | Source: Ambulatory Visit | Attending: Adult Health | Admitting: Adult Health

## 2019-07-09 VITALS — BP 125/69 | HR 84 | Temp 97.6°F | Resp 16 | Ht 63.0 in | Wt 174.0 lb

## 2019-07-09 DIAGNOSIS — W19XXXA Unspecified fall, initial encounter: Secondary | ICD-10-CM | POA: Diagnosis not present

## 2019-07-09 DIAGNOSIS — I10 Essential (primary) hypertension: Secondary | ICD-10-CM

## 2019-07-09 DIAGNOSIS — Z043 Encounter for examination and observation following other accident: Secondary | ICD-10-CM | POA: Insufficient documentation

## 2019-07-09 DIAGNOSIS — S300XXA Contusion of lower back and pelvis, initial encounter: Secondary | ICD-10-CM

## 2019-07-10 ENCOUNTER — Encounter: Payer: Self-pay | Admitting: Adult Health

## 2019-07-10 ENCOUNTER — Telehealth: Payer: Self-pay

## 2019-07-10 MED ORDER — TRAMADOL HCL 50 MG PO TABS: 50.0000 mg | ORAL_TABLET | Freq: Three times a day (TID) | ORAL | 0 refills | Status: DC | PRN

## 2019-07-10 NOTE — Telephone Encounter (Signed)
Pt advised for CT as per adam she did have some incidental finding in her Ethmoid sinus he will send some med for your pain

## 2019-07-10 NOTE — Progress Notes (Signed)
[facility] [place], [place] [address]  Internal MEDICINE  Office Visit Note  Patient Name: Yvette Humphrey  [id number]  [id number]  Date of Service: 07/31/2019  Chief Complaint  Patient presents with  . Fall     HPI Pt is here for a sick visit.  Pt is here reporting a fall, from standing a few days ago.  She landed on her backside, and has been having pain with sitting.  She is visibly uncomfortable while attempting to sit in exam room. At the time of the fall she did have a brief LOC, and she does not remember directly after the fall. She had some mild dizziness that has resolved, but she has been tired and fatigued since the fall.  She has been sore all over since her fall.       Current Medication:  Outpatient Encounter Medications as of 07/09/2019  Medication Sig  . ALPRAZolam (XANAX) 0.5 MG tablet Take 1 tablet (0.5 mg total) by mouth daily as needed for anxiety.  . Butalbital-APAP-Caffeine (FIORICET) 50-300-40 MG CAPS Take 1 capsule by mouth as needed. migraines  . cetirizine (ZYRTEC) 10 MG tablet Take 10 mg by mouth daily.  . cyanocobalamin 500 MCG tablet Take 500 mcg by mouth daily.  Marland Kitchen dicyclomine (BENTYL) 10 MG capsule Take 1 to 2 capsules po TID prn cramping  . ergocalciferol (DRISDOL) 1.25 MG (50000 UT) capsule Take 1 capsule (50,000 Units total) by mouth once a week.  . Eszopiclone 3 MG TABS Take 1 tablet (3 mg total) by mouth at bedtime. Take immediately before bedtime  . fluconazole (DIFLUCAN) 150 MG tablet TAKE 1 TABLET BY MOUTH 1 TIME. REPEAT AGAIN IF SYMPTOMS PERSISTS  . Lysine 1000 MG TABS Take by mouth daily as needed.  . Magnesium 250 MG TABS Take by mouth daily.  . meloxicam (MOBIC) 15 MG tablet Take 1 tablet (15 mg total) by mouth daily.  . metoprolol succinate (TOPROL-XL) 50 MG 24 hr tablet Take 1 tablet (50 mg total) by mouth daily. Take with or immediately following a meal.  . POTASSIUM CHLORIDE PO Take 10 mcg by mouth daily.  .  sertraline (ZOLOFT) 100 MG tablet Take 1 tablet (100 mg total) by mouth daily.  Marland Kitchen zolpidem (AMBIEN CR) 12.5 MG CR tablet Take 1 tablet (12.5 mg total) by mouth at bedtime as needed for sleep.  . [EXPIRED] traMADol (ULTRAM) 50 MG tablet Take 1 tablet (50 mg total) by mouth every 8 (eight) hours as needed for up to 5 days.  . [DISCONTINUED] chlorpheniramine-HYDROcodone (TUSSIONEX PENNKINETIC ER) 10-8 MG/5ML SUER Take 5 mLs by mouth every 12 (twelve) hours as needed for cough. (Patient not taking: Reported on 07/10/2019)   No facility-administered encounter medications on file as of 07/09/2019.      Medical History: Past Medical History:  Diagnosis Date  . Complication of anesthesia   . Headache    migraines, 1x/mo  . Hypertension   . Motion sickness    back seat cars  . PONV (postoperative nausea and vomiting)   . Tachycardia   . Wears contact lenses      Vital Signs: BP 125/69   Pulse 84   Temp 97.6 F (36.4 C)   Resp 16   Ht 5' 3"  (1.6 m)   Wt 174 lb (78.9 kg)   BMI 30.82 kg/m    Review of Systems  Constitutional: Negative for chills, fatigue and unexpected weight change.  HENT: Negative for congestion, rhinorrhea, sneezing  and sore throat.   Eyes: Negative for photophobia, pain and redness.  Respiratory: Negative for cough, chest tightness and shortness of breath.   Cardiovascular: Negative for chest pain and palpitations.  Gastrointestinal: Negative for abdominal pain, constipation, diarrhea, nausea and vomiting.  Endocrine: Negative.   Genitourinary: Negative for dysuria and frequency.  Musculoskeletal: Positive for back pain. Negative for arthralgias, joint swelling and neck pain.       Pain at coccyx.   Skin: Negative for rash.  Allergic/Immunologic: Negative.   Neurological: Negative for tremors and numbness.       Dizzines at time of fall, as well as LOC.  Hematological: Negative for adenopathy. Does not bruise/bleed easily.  Psychiatric/Behavioral: Negative  for behavioral problems and sleep disturbance. The patient is not nervous/anxious.     Physical Exam Vitals and nursing note reviewed.  Constitutional:      General: She is not in acute distress.    Appearance: She is well-developed. She is not diaphoretic.  HENT:     Head: Normocephalic and atraumatic.     Mouth/Throat:     Pharynx: No oropharyngeal exudate.  Eyes:     Pupils: Pupils are equal, round, and reactive to light.  Neck:     Thyroid: No thyromegaly.     Vascular: No JVD.     Trachea: No tracheal deviation.  Cardiovascular:     Rate and Rhythm: Normal rate and regular rhythm.     Heart sounds: Normal heart sounds. No murmur. No friction rub. No gallop.   Pulmonary:     Effort: Pulmonary effort is normal. No respiratory distress.     Breath sounds: Normal breath sounds. No wheezing or rales.  Chest:     Chest wall: No tenderness.  Abdominal:     Palpations: Abdomen is soft.     Tenderness: There is no abdominal tenderness. There is no guarding.  Musculoskeletal:        General: Normal range of motion.     Cervical back: Normal range of motion and neck supple.     Comments: Tenderness at sacrum/coccyx  Lymphadenopathy:     Cervical: No cervical adenopathy.  Skin:    General: Skin is warm and dry.  Neurological:     Mental Status: She is alert and oriented to person, place, and time.     Cranial Nerves: No cranial nerve deficit.  Psychiatric:        Behavior: Behavior normal.        Thought Content: Thought content normal.        Judgment: Judgment normal.    Assessment/Plan: 1. Contusion of coccyx, initial encounter Encouraged patient to use ice or heat for 20 minutes on, 2 hours off. May take antiinflamatory medication such as motrin, aleve or advil.  Small amount of Tramadol for severe pain provided. Stat head CT ordered, WNL at this time.  - traMADol (ULTRAM) 50 MG tablet; Take 1 tablet (50 mg total) by mouth every 8 (eight) hours as needed for up to 5  days.  Dispense: 15 tablet; Refill: 0 Reviewed risks and possible side effects associated with taking opiates, benzodiazepines and other CNS depressants. Combination of these could cause dizziness and drowsiness. Advised patient not to drive or operate machinery when taking these medications, as patient's and other's life can be at risk and will have consequences. Patient verbalized understanding in this matter. Dependence and abuse for these drugs will be monitored closely. A Controlled substance policy and procedure is on file which allows  [facility] to order a urine drug screen test at any visit. Patient understands and agrees with the plan  2. Essential (primary) hypertension Well controlled, continue present management.  General Counseling: addilee neu understanding of the findings of todays visit and agrees with plan of treatment. I have discussed any further diagnostic evaluation that may be needed or ordered today. We also reviewed her medications today. she has been encouraged to call the office with any questions or concerns that should arise related to todays visit.   No orders of the defined types were placed in this encounter.   Meds ordered this encounter  Medications  . traMADol (ULTRAM) 50 MG tablet    Sig: Take 1 tablet (50 mg total) by mouth every 8 (eight) hours as needed for up to 5 days.    Dispense:  15 tablet    Refill:  0    Time spent: 25 Minutes  This patient was seen by Orson Gear AGNP-C in Collaboration with Dr Lavera Guise as a part of collaborative care agreement.  Kendell Bane AGNP-C Internal Medicine

## 2019-07-11 ENCOUNTER — Telehealth: Payer: Self-pay

## 2019-07-11 NOTE — Telephone Encounter (Signed)
Called and checked on Yvette Humphrey , she said she is still sore, but that was to be expected, she did state that she is congested but that is very common for her she tends to have a lot of sinus infections, I asked her if she was ever seen by ent and she stated that she does not recall ever going to one

## 2019-07-11 NOTE — Telephone Encounter (Signed)
-----   Message from Lavera Guise, MD sent at 07/11/2019 10:14 AM EST ----- Can you please find out if she feels congested in her sinuses, CT was normal

## 2019-07-26 ENCOUNTER — Other Ambulatory Visit: Payer: Self-pay | Admitting: Adult Health

## 2019-07-26 NOTE — Telephone Encounter (Signed)
Last12/1/20

## 2019-08-08 ENCOUNTER — Telehealth: Payer: Self-pay

## 2019-08-08 NOTE — Telephone Encounter (Signed)
CONFIRMED AND SCREENED FOR 08-13-19 OV.

## 2019-08-13 ENCOUNTER — Encounter: Payer: Self-pay | Admitting: Nurse Practitioner

## 2019-08-13 ENCOUNTER — Other Ambulatory Visit: Payer: Self-pay

## 2019-08-13 ENCOUNTER — Ambulatory Visit: Payer: BC Managed Care – PPO | Admitting: Adult Health

## 2019-08-13 VITALS — BP 130/71 | HR 79 | Resp 16 | Ht 63.0 in | Wt 177.8 lb

## 2019-08-13 DIAGNOSIS — F411 Generalized anxiety disorder: Secondary | ICD-10-CM

## 2019-08-13 DIAGNOSIS — S300XXA Contusion of lower back and pelvis, initial encounter: Secondary | ICD-10-CM

## 2019-08-13 DIAGNOSIS — R5383 Other fatigue: Secondary | ICD-10-CM

## 2019-08-13 DIAGNOSIS — I10 Essential (primary) hypertension: Secondary | ICD-10-CM

## 2019-08-13 DIAGNOSIS — J0110 Acute frontal sinusitis, unspecified: Secondary | ICD-10-CM | POA: Diagnosis not present

## 2019-08-13 DIAGNOSIS — E559 Vitamin D deficiency, unspecified: Secondary | ICD-10-CM

## 2019-08-13 MED ORDER — CLARITHROMYCIN 500 MG PO TABS: 500.0000 mg | ORAL_TABLET | Freq: Two times a day (BID) | ORAL | 0 refills | Status: DC

## 2019-08-13 NOTE — Progress Notes (Signed)
[facility] [place], [place] [address]  Internal MEDICINE  Office Visit Note  Patient Name: Yvette Humphrey  [id number]  [id number]  Date of Service: 08/13/2019  Chief Complaint  Patient presents with  . Hypertension  . Sinusitis    pt feels like she is getting a sinus infection, sinus pressure, headache, yellowish mucous    HPI  Pt is here for follow up.  Overall she is doing well. She is complaining today of sinus pressure, headache and congestion.  Also Pnd and ear pressure. She reports getting sinus infections a few times a year.  Denies fever, sob or cough.   Current Medication: Outpatient Encounter Medications as of 08/13/2019  Medication Sig  . ALPRAZolam (XANAX) 0.5 MG tablet Take 1 tablet (0.5 mg total) by mouth daily as needed for anxiety.  . Butalbital-APAP-Caffeine (FIORICET) 50-300-40 MG CAPS Take 1 capsule by mouth as needed. migraines  . cetirizine (ZYRTEC) 10 MG tablet Take 10 mg by mouth daily.  . cyanocobalamin 500 MCG tablet Take 500 mcg by mouth daily.  Marland Kitchen dicyclomine (BENTYL) 10 MG capsule Take 1 to 2 capsules po TID prn cramping  . ergocalciferol (DRISDOL) 1.25 MG (50000 UT) capsule Take 1 capsule (50,000 Units total) by mouth once a week.  . Eszopiclone 3 MG TABS Take 1 tablet (3 mg total) by mouth at bedtime. Take immediately before bedtime  . fluconazole (DIFLUCAN) 150 MG tablet TAKE 1 TABLET BY MOUTH 1 TIME. REPEAT AGAIN IF SYMPTOMS PERSISTS  . Lysine 1000 MG TABS Take by mouth daily as needed.  . Magnesium 250 MG TABS Take by mouth daily.  . meloxicam (MOBIC) 15 MG tablet Take 1 tablet (15 mg total) by mouth daily.  . metoprolol succinate (TOPROL-XL) 50 MG 24 hr tablet Take 1 tablet (50 mg total) by mouth daily. Take with or immediately following a meal.  . POTASSIUM CHLORIDE PO Take 10 mcg by mouth daily.  . sertraline (ZOLOFT) 100 MG tablet Take 1 tablet (100 mg total) by mouth daily.  . traMADol (ULTRAM) 50 MG tablet TAKE 1  TABLET(50 MG) BY MOUTH EVERY 8 HOURS FOR UP TO 5 DAYS AS NEEDED  . zolpidem (AMBIEN CR) 12.5 MG CR tablet Take 1 tablet (12.5 mg total) by mouth at bedtime as needed for sleep.  . clarithromycin (BIAXIN) 500 MG tablet Take 1 tablet (500 mg total) by mouth 2 (two) times daily.   No facility-administered encounter medications on file as of 08/13/2019.    Surgical History: Past Surgical History:  Procedure Laterality Date  . CHOLECYSTECTOMY    . ENDOMETRIAL ABLATION     approx 2009  . ESOPHAGOGASTRODUODENOSCOPY    . REDUCTION MAMMAPLASTY Bilateral 2004  . TONSILLECTOMY    . TUBAL LIGATION    . ULNAR NERVE TRANSPOSITION Left 06/08/2016   Procedure: SUBCUTANEOUS ANTERIOR ULNAR NERVE TRANSPOSITION LEFT ELBOW;  Surgeon: Corky Mull, MD;  Location: [place];  Service: Orthopedics;  Laterality: Left;    Medical History: Past Medical History:  Diagnosis Date  . Complication of anesthesia   . Headache    migraines, 1x/mo  . Hypertension   . Motion sickness    back seat cars  . PONV (postoperative nausea and vomiting)   . Tachycardia   . Wears contact lenses     Family History: Family History  Problem Relation Age of Onset  . Breast cancer Paternal Grandmother     Social History   Socioeconomic History  . Marital status:  Married    Spouse name: Not on file  . Number of children: Not on file  . Years of education: Not on file  . Highest education level: Not on file  Occupational History  . Not on file  Tobacco Use  . Smoking status: Never Smoker  . Smokeless tobacco: Never Used  Substance and Sexual Activity  . Alcohol use: Yes    Alcohol/week: 1.0 standard drinks    Types: 1 Glasses of wine per week    Comment: occasionally  . Drug use: No  . Sexual activity: Not on file  Other Topics Concern  . Not on file  Social History Narrative  . Not on file   Social Determinants of Health   Financial Resource Strain:   . Difficulty of Paying Living Expenses:  Not on file  Food Insecurity:   . Worried About Charity fundraiser in the Last Year: Not on file  . Ran Out of Food in the Last Year: Not on file  Transportation Needs:   . Lack of Transportation (Medical): Not on file  . Lack of Transportation (Non-Medical): Not on file  Physical Activity:   . Days of Exercise per Week: Not on file  . Minutes of Exercise per Session: Not on file  Stress:   . Feeling of Stress : Not on file  Social Connections:   . Frequency of Communication with Friends and Family: Not on file  . Frequency of Social Gatherings with Friends and Family: Not on file  . Attends Religious Services: Not on file  . Active Member of Clubs or Organizations: Not on file  . Attends Archivist Meetings: Not on file  . Marital Status: Not on file  Intimate Partner Violence:   . Fear of Current or Ex-Partner: Not on file  . Emotionally Abused: Not on file  . Physically Abused: Not on file  . Sexually Abused: Not on file      Review of Systems  Constitutional: Negative for chills, fatigue and unexpected weight change.  HENT: Positive for ear pain, postnasal drip, sinus pressure and sinus pain. Negative for congestion, rhinorrhea, sneezing and sore throat.   Eyes: Negative for photophobia, pain and redness.  Respiratory: Negative for cough, chest tightness and shortness of breath.   Cardiovascular: Negative for chest pain and palpitations.  Gastrointestinal: Negative for abdominal pain, constipation, diarrhea, nausea and vomiting.  Endocrine: Negative.   Genitourinary: Negative for dysuria and frequency.  Musculoskeletal: Negative for arthralgias, back pain, joint swelling and neck pain.  Skin: Negative for rash.  Allergic/Immunologic: Negative.   Neurological: Negative for tremors and numbness.  Hematological: Negative for adenopathy. Does not bruise/bleed easily.  Psychiatric/Behavioral: Negative for behavioral problems and sleep disturbance. The patient is  not nervous/anxious.     Vital Signs: BP 130/71   Pulse 79   Resp 16   Ht 5' 3"  (1.6 m)   Wt 177 lb 12.8 oz (80.6 kg)   SpO2 98%   BMI 31.50 kg/m    Physical Exam Vitals and nursing note reviewed.  Constitutional:      General: She is not in acute distress.    Appearance: She is well-developed. She is not diaphoretic.  HENT:     Head: Normocephalic and atraumatic.     Mouth/Throat:     Pharynx: No oropharyngeal exudate.  Eyes:     Pupils: Pupils are equal, round, and reactive to light.  Neck:     Thyroid: No thyromegaly.  Vascular: No JVD.     Trachea: No tracheal deviation.  Cardiovascular:     Rate and Rhythm: Normal rate and regular rhythm.     Heart sounds: Normal heart sounds. No murmur. No friction rub. No gallop.   Pulmonary:     Effort: Pulmonary effort is normal. No respiratory distress.     Breath sounds: Normal breath sounds. No wheezing or rales.  Chest:     Chest wall: No tenderness.  Abdominal:     Palpations: Abdomen is soft.     Tenderness: There is no abdominal tenderness. There is no guarding.  Musculoskeletal:        General: Normal range of motion.     Cervical back: Normal range of motion and neck supple.  Lymphadenopathy:     Cervical: No cervical adenopathy.  Skin:    General: Skin is warm and dry.  Neurological:     Mental Status: She is alert and oriented to person, place, and time.     Cranial Nerves: No cranial nerve deficit.  Psychiatric:        Behavior: Behavior normal.        Thought Content: Thought content normal.        Judgment: Judgment normal.    Assessment/Plan: 1. Other fatigue Get labs and follow up accordingly - CBC with Differential/Platelet - Lipid Panel With LDL/HDL Ratio - TSH - T4, free - Comprehensive metabolic panel - Y84 and Folate Panel - Fe+TIBC+Fer  2. Essential (primary) hypertension Stable, continue present management.  3. Acute non-recurrent frontal sinusitis Advised patient to take entire  course of antibiotics as prescribed with food. Pt should return to clinic in 7-10 days if symptoms fail to improve or new symptoms develop.  - clarithromycin (BIAXIN) 500 MG tablet; Take 1 tablet (500 mg total) by mouth 2 (two) times daily.  Dispense: 20 tablet; Refill: 0  4. Contusion of coccyx, initial encounter Still having some pain, continues to get better.  5. GAD (generalized anxiety disorder) Controlled at this time.  Continue present management.  6. Vitamin D deficiency - Vitamin D 1,25 dihydroxy  General Counseling: Charizma verbalizes understanding of the findings of todays visit and agrees with plan of treatment. I have discussed any further diagnostic evaluation that may be needed or ordered today. We also reviewed her medications today. she has been encouraged to call the office with any questions or concerns that should arise related to todays visit.    Orders Placed This Encounter  Procedures  . CBC with Differential/Platelet  . Lipid Panel With LDL/HDL Ratio  . TSH  . T4, free  . Comprehensive metabolic panel  . Vitamin D 1,25 dihydroxy  . B12 and Folate Panel  . Fe+TIBC+Fer    Meds ordered this encounter  Medications  . clarithromycin (BIAXIN) 500 MG tablet    Sig: Take 1 tablet (500 mg total) by mouth 2 (two) times daily.    Dispense:  20 tablet    Refill:  0    Time spent: 15 Minutes   This patient was seen by Orson Gear AGNP-C in Collaboration with Dr Lavera Guise as a part of collaborative care agreement     Kendell Bane AGNP-C Internal medicine

## 2019-08-14 ENCOUNTER — Other Ambulatory Visit: Payer: Self-pay

## 2019-08-14 DIAGNOSIS — B379 Candidiasis, unspecified: Secondary | ICD-10-CM

## 2019-08-14 MED ORDER — FLUCONAZOLE 150 MG PO TABS: ORAL_TABLET | ORAL | 0 refills | Status: DC

## 2019-09-26 ENCOUNTER — Other Ambulatory Visit: Payer: Self-pay

## 2019-09-26 DIAGNOSIS — I10 Essential (primary) hypertension: Secondary | ICD-10-CM

## 2019-09-26 MED ORDER — METOPROLOL SUCCINATE ER 50 MG PO TB24: 50.0000 mg | ORAL_TABLET | Freq: Every day | ORAL | 3 refills | Status: DC

## 2019-10-03 ENCOUNTER — Telehealth: Payer: Self-pay

## 2019-10-03 NOTE — Telephone Encounter (Signed)
Confirmed appointment on 10/08/2019 and screened for covid. [facility]

## 2019-10-08 ENCOUNTER — Encounter: Payer: Self-pay | Admitting: Adult Health

## 2019-10-08 ENCOUNTER — Other Ambulatory Visit: Payer: Self-pay

## 2019-10-08 ENCOUNTER — Ambulatory Visit (INDEPENDENT_AMBULATORY_CARE_PROVIDER_SITE_OTHER): Payer: BC Managed Care – PPO | Admitting: Adult Health

## 2019-10-08 VITALS — BP 153/78 | HR 78 | Temp 97.8°F | Resp 16 | Ht 63.0 in | Wt 182.0 lb

## 2019-10-08 DIAGNOSIS — I10 Essential (primary) hypertension: Secondary | ICD-10-CM

## 2019-10-08 DIAGNOSIS — F5101 Primary insomnia: Secondary | ICD-10-CM

## 2019-10-08 DIAGNOSIS — R7989 Other specified abnormal findings of blood chemistry: Secondary | ICD-10-CM

## 2019-10-08 NOTE — Progress Notes (Signed)
[address], [place] [address]  Internal MEDICINE  Office Visit Note  Patient Name: Yvette Humphrey  [id number]  [id number]  Date of Service: 10/08/2019  Chief Complaint  Patient presents with  . Follow-up    follow up on blood work and sleep medicine  . Hypertension    HPI  Pt is here for follow up on blood work.  Her Ferritin level is elevated over 300, and her AST and ALT are elevated as well.  This has been ongoing, and she has seen GI and hematology in the past. Her complaint today is that she continues to have issues sleeping. She has tried multiple medications in the past.  Most recently she has been using [place].  Her blood pressure today is 153/78, she Denies Chest pain, Shortness of breath, palpitations, headache, or blurred vision.    Current Medication: Outpatient Encounter Medications as of 10/08/2019  Medication Sig  . ALPRAZolam (XANAX) 0.5 MG tablet Take 1 tablet (0.5 mg total) by mouth daily as needed for anxiety.  . Butalbital-APAP-Caffeine (FIORICET) 50-300-40 MG CAPS Take 1 capsule by mouth as needed. migraines  . cetirizine (ZYRTEC) 10 MG tablet Take 10 mg by mouth daily.  . clarithromycin (BIAXIN) 500 MG tablet Take 1 tablet (500 mg total) by mouth 2 (two) times daily.  . cyanocobalamin 500 MCG tablet Take 500 mcg by mouth daily.  Marland Kitchen dicyclomine (BENTYL) 10 MG capsule Take 1 to 2 capsules po TID prn cramping  . ergocalciferol (DRISDOL) 1.25 MG (50000 UT) capsule Take 1 capsule (50,000 Units total) by mouth once a week.  . Eszopiclone 3 MG TABS Take 1 tablet (3 mg total) by mouth at bedtime. Take immediately before bedtime  . fluconazole (DIFLUCAN) 150 MG tablet TAKE 1 TABLET BY MOUTH 1 TIME. REPEAT AGAIN IF SYMPTOMS PERSISTS  . Lysine 1000 MG TABS Take by mouth daily as needed.  . Magnesium 250 MG TABS Take by mouth daily.  . meloxicam (MOBIC) 15 MG tablet Take 1 tablet (15 mg total) by mouth daily.  . metoprolol succinate  (TOPROL-XL) 50 MG 24 hr tablet Take 1 tablet (50 mg total) by mouth daily. Take with or immediately following a meal.  . POTASSIUM CHLORIDE PO Take 10 mcg by mouth daily.  . sertraline (ZOLOFT) 100 MG tablet Take 1 tablet (100 mg total) by mouth daily.  . traMADol (ULTRAM) 50 MG tablet TAKE 1 TABLET(50 MG) BY MOUTH EVERY 8 HOURS FOR UP TO 5 DAYS AS NEEDED  . zolpidem (AMBIEN CR) 12.5 MG CR tablet Take 1 tablet (12.5 mg total) by mouth at bedtime as needed for sleep.   No facility-administered encounter medications on file as of 10/08/2019.    Surgical History: Past Surgical History:  Procedure Laterality Date  . CHOLECYSTECTOMY    . ENDOMETRIAL ABLATION     approx 2009  . ESOPHAGOGASTRODUODENOSCOPY    . REDUCTION MAMMAPLASTY Bilateral 2004  . TONSILLECTOMY    . TUBAL LIGATION    . ULNAR NERVE TRANSPOSITION Left 06/08/2016   Procedure: SUBCUTANEOUS ANTERIOR ULNAR NERVE TRANSPOSITION LEFT ELBOW;  Surgeon: Corky Mull, MD;  Location: [facility];  Service: Orthopedics;  Laterality: Left;    Medical History: Past Medical History:  Diagnosis Date  . Complication of anesthesia   . Headache    migraines, 1x/mo  . Hypertension   . Motion sickness    back seat cars  . PONV (postoperative nausea and vomiting)   . Tachycardia   .  Wears contact lenses     Family History: Family History  Problem Relation Age of Onset  . Breast cancer Paternal Grandmother     Social History   Socioeconomic History  . Marital status: Married    Spouse name: Not on file  . Number of children: Not on file  . Years of education: Not on file  . Highest education level: Not on file  Occupational History  . Not on file  Tobacco Use  . Smoking status: Never Smoker  . Smokeless tobacco: Never Used  Substance and Sexual Activity  . Alcohol use: Yes    Alcohol/week: 1.0 standard drinks    Types: 1 Glasses of wine per week    Comment: occasionally  . Drug use: No  . Sexual activity: Not on  file  Other Topics Concern  . Not on file  Social History Narrative  . Not on file   Social Determinants of Health   Financial Resource Strain:   . Difficulty of Paying Living Expenses: Not on file  Food Insecurity:   . Worried About Charity fundraiser in the Last Year: Not on file  . Ran Out of Food in the Last Year: Not on file  Transportation Needs:   . Lack of Transportation (Medical): Not on file  . Lack of Transportation (Non-Medical): Not on file  Physical Activity:   . Days of Exercise per Week: Not on file  . Minutes of Exercise per Session: Not on file  Stress:   . Feeling of Stress : Not on file  Social Connections:   . Frequency of Communication with Friends and Family: Not on file  . Frequency of Social Gatherings with Friends and Family: Not on file  . Attends Religious Services: Not on file  . Active Member of Clubs or Organizations: Not on file  . Attends Archivist Meetings: Not on file  . Marital Status: Not on file  Intimate Partner Violence:   . Fear of Current or Ex-Partner: Not on file  . Emotionally Abused: Not on file  . Physically Abused: Not on file  . Sexually Abused: Not on file      Review of Systems  Constitutional: Negative for chills, fatigue and unexpected weight change.  HENT: Negative for congestion, rhinorrhea, sneezing and sore throat.   Eyes: Negative for photophobia, pain and redness.  Respiratory: Negative for cough, chest tightness and shortness of breath.   Cardiovascular: Negative for chest pain and palpitations.  Gastrointestinal: Negative for abdominal pain, constipation, diarrhea, nausea and vomiting.  Endocrine: Negative.   Genitourinary: Negative for dysuria and frequency.  Musculoskeletal: Negative for arthralgias, back pain, joint swelling and neck pain.  Skin: Negative for rash.  Allergic/Immunologic: Negative.   Neurological: Negative for tremors and numbness.  Hematological: Negative for adenopathy. Does  not bruise/bleed easily.  Psychiatric/Behavioral: Negative for behavioral problems and sleep disturbance. The patient is not nervous/anxious.     Vital Signs: BP (!) 153/78   Pulse 78   Temp 97.8 F (36.6 C)   Resp 16   Ht 5' 3"  (1.6 m)   Wt 182 lb (82.6 kg)   BMI 32.24 kg/m    Physical Exam Vitals and nursing note reviewed.  Constitutional:      General: She is not in acute distress.    Appearance: She is well-developed. She is not diaphoretic.  HENT:     Head: Normocephalic and atraumatic.     Mouth/Throat:     Pharynx: No oropharyngeal  exudate.  Eyes:     Pupils: Pupils are equal, round, and reactive to light.  Neck:     Thyroid: No thyromegaly.     Vascular: No JVD.     Trachea: No tracheal deviation.  Cardiovascular:     Rate and Rhythm: Normal rate and regular rhythm.     Heart sounds: Normal heart sounds. No murmur. No friction rub. No gallop.   Pulmonary:     Effort: Pulmonary effort is normal. No respiratory distress.     Breath sounds: Normal breath sounds. No wheezing or rales.  Chest:     Chest wall: No tenderness.  Abdominal:     Palpations: Abdomen is soft.     Tenderness: There is no abdominal tenderness. There is no guarding.  Musculoskeletal:        General: Normal range of motion.     Cervical back: Normal range of motion and neck supple.  Lymphadenopathy:     Cervical: No cervical adenopathy.  Skin:    General: Skin is warm and dry.  Neurological:     Mental Status: She is alert and oriented to person, place, and time.     Cranial Nerves: No cranial nerve deficit.  Psychiatric:        Behavior: Behavior normal.        Thought Content: Thought content normal.        Judgment: Judgment normal.    Assessment/Plan: 1. Primary insomnia Encouraged her to stop lunesta and try to take 2 xanax tablets (1 mg total) before bed.  Would consider trazodone in the future if this is unsuccessful.   2. Essential (primary) hypertension Stable, continue  to monitor.  3. Elevated ferritin level 330 at last draw, continue to monitor.  General Counseling: Yvette Humphrey understanding of the findings of todays visit and agrees with plan of treatment. I have discussed any further diagnostic evaluation that may be needed or ordered today. We also reviewed her medications today. she has been encouraged to call the office with any questions or concerns that should arise related to todays visit.    No orders of the defined types were placed in this encounter.   No orders of the defined types were placed in this encounter.   Time spent: 25 Minutes   This patient was seen by Orson Gear AGNP-C in Collaboration with Dr Lavera Guise as a part of collaborative care agreement     Kendell Bane AGNP-C Internal medicine

## 2019-10-09 ENCOUNTER — Other Ambulatory Visit: Payer: Self-pay | Admitting: Nurse Practitioner

## 2019-10-09 DIAGNOSIS — Z1231 Encounter for screening mammogram for malignant neoplasm of breast: Secondary | ICD-10-CM

## 2019-10-11 LAB — COMPREHENSIVE METABOLIC PANEL
ALT: 62 IU/L — ABNORMAL HIGH (ref 0–32)
AST: 45 IU/L — ABNORMAL HIGH (ref 0–40)
Albumin/Globulin Ratio: 2.3 — ABNORMAL HIGH (ref 1.2–2.2)
Albumin: 4.6 g/dL (ref 3.8–4.8)
Alkaline Phosphatase: 56 IU/L (ref 39–117)
BUN/Creatinine Ratio: 16 (ref 9–23)
BUN: 11 mg/dL (ref 6–24)
Bilirubin Total: 0.3 mg/dL (ref 0.0–1.2)
CO2: 22 mmol/L (ref 20–29)
Calcium: 9.4 mg/dL (ref 8.7–10.2)
Chloride: 103 mmol/L (ref 96–106)
Creatinine, Ser: 0.7 mg/dL (ref 0.57–1.00)
GFR calc Af Amer: 120 mL/min/{1.73_m2} (ref >59)
GFR calc non Af Amer: 104 mL/min/{1.73_m2} (ref >59)
Globulin, Total: 2 g/dL (ref 1.5–4.5)
Glucose: 104 mg/dL — ABNORMAL HIGH (ref 65–99)
Potassium: 4.6 mmol/L (ref 3.5–5.2)
Sodium: 139 mmol/L (ref 134–144)
Total Protein: 6.6 g/dL (ref 6.0–8.5)

## 2019-10-11 LAB — CBC WITH DIFFERENTIAL/PLATELET
Basophils Absolute: 0.1 10*3/uL (ref 0.0–0.2)
Basos: 1 %
EOS (ABSOLUTE): 0.2 10*3/uL (ref 0.0–0.4)
Eos: 3 %
Hematocrit: 39.2 % (ref 34.0–46.6)
Hemoglobin: 13.7 g/dL (ref 11.1–15.9)
Immature Grans (Abs): 0.1 10*3/uL (ref 0.0–0.1)
Immature Granulocytes: 1 %
Lymphocytes Absolute: 2.4 10*3/uL (ref 0.7–3.1)
Lymphs: 30 %
MCH: 32 pg (ref 26.6–33.0)
MCHC: 34.9 g/dL (ref 31.5–35.7)
MCV: 92 fL (ref 79–97)
Monocytes Absolute: 0.4 10*3/uL (ref 0.1–0.9)
Monocytes: 5 %
Neutrophils Absolute: 4.8 10*3/uL (ref 1.4–7.0)
Neutrophils: 60 %
Platelets: 270 10*3/uL (ref 150–450)
RBC: 4.28 x10E6/uL (ref 3.77–5.28)
RDW: 11.8 % (ref 11.7–15.4)
WBC: 8.1 10*3/uL (ref 3.4–10.8)

## 2019-10-11 LAB — IRON,TIBC AND FERRITIN PANEL
Ferritin: 332 ng/mL — ABNORMAL HIGH (ref 15–150)
Iron Saturation: 28 % (ref 15–55)
Iron: 89 ug/dL (ref 27–159)
Total Iron Binding Capacity: 314 ug/dL (ref 250–450)
UIBC: 225 ug/dL (ref 131–425)

## 2019-10-11 LAB — LIPID PANEL WITH LDL/HDL RATIO
Cholesterol, Total: 212 mg/dL — ABNORMAL HIGH (ref 100–199)
HDL: 53 mg/dL (ref >39)
LDL Chol Calc (NIH): 122 mg/dL — ABNORMAL HIGH (ref 0–99)
LDL/HDL Ratio: 2.3 ratio (ref 0.0–3.2)
Triglycerides: 209 mg/dL — ABNORMAL HIGH (ref 0–149)
VLDL Cholesterol Cal: 37 mg/dL (ref 5–40)

## 2019-10-11 LAB — VITAMIN D 1,25 DIHYDROXY
Vitamin D 1, 25 (OH)2 Total: 26 pg/mL
Vitamin D2 1, 25 (OH)2: 11 pg/mL
Vitamin D3 1, 25 (OH)2: 15 pg/mL

## 2019-10-11 LAB — T4, FREE: Free T4: 1.21 ng/dL (ref 0.82–1.77)

## 2019-10-11 LAB — TSH: TSH: 1.13 u[IU]/mL (ref 0.450–4.500)

## 2019-10-11 LAB — B12 AND FOLATE PANEL
Folate: >20 ng/mL (ref >3.0)
Vitamin B-12: 476 pg/mL (ref 232–1245)

## 2019-10-17 ENCOUNTER — Other Ambulatory Visit: Payer: Self-pay

## 2019-10-17 DIAGNOSIS — F411 Generalized anxiety disorder: Secondary | ICD-10-CM

## 2019-10-20 ENCOUNTER — Other Ambulatory Visit: Payer: Self-pay | Admitting: Internal Medicine

## 2019-10-20 DIAGNOSIS — F411 Generalized anxiety disorder: Secondary | ICD-10-CM

## 2019-10-20 MED ORDER — ALPRAZOLAM 0.5 MG PO TABS: 0.5000 mg | ORAL_TABLET | Freq: Every day | ORAL | 0 refills | Status: DC | PRN

## 2019-10-20 NOTE — Progress Notes (Signed)
Pt with elevated transaminases and Ferritin, needs a plan of care

## 2019-10-21 NOTE — Progress Notes (Signed)
She sees GI due to these abnormal liver functions. She has had this as well as elevated ferritin levels in the past. We did abdominal ultrasound and other studies a few years ago, and she has been seeing GI ever since.

## 2019-10-24 ENCOUNTER — Other Ambulatory Visit: Payer: Self-pay | Admitting: Internal Medicine

## 2019-10-24 DIAGNOSIS — F411 Generalized anxiety disorder: Secondary | ICD-10-CM

## 2019-10-31 ENCOUNTER — Ambulatory Visit
Admission: RE | Admit: 2019-10-31 | Discharge: 2019-10-31 | Disposition: A | Discharge status: Home / Self Care | Payer: BC Managed Care – PPO | Source: Ambulatory Visit | Attending: Nurse Practitioner | Admitting: Nurse Practitioner

## 2019-10-31 DIAGNOSIS — Z1231 Encounter for screening mammogram for malignant neoplasm of breast: Secondary | ICD-10-CM | POA: Insufficient documentation

## 2019-11-01 ENCOUNTER — Telehealth: Payer: Self-pay

## 2019-11-01 NOTE — Progress Notes (Signed)
Negative mammogram

## 2019-11-01 NOTE — Telephone Encounter (Signed)
Confirmed appointment on 11/05/2019 and screened for covid.Marland Kitchen klh

## 2019-11-05 ENCOUNTER — Ambulatory Visit (INDEPENDENT_AMBULATORY_CARE_PROVIDER_SITE_OTHER): Payer: BC Managed Care – PPO | Admitting: Adult Health

## 2019-11-05 ENCOUNTER — Encounter: Payer: Self-pay | Admitting: Adult Health

## 2019-11-05 ENCOUNTER — Other Ambulatory Visit: Payer: Self-pay

## 2019-11-05 VITALS — BP 130/72 | HR 82 | Temp 97.4°F | Resp 16 | Ht 63.0 in | Wt 179.2 lb

## 2019-11-05 DIAGNOSIS — R0683 Snoring: Secondary | ICD-10-CM | POA: Diagnosis not present

## 2019-11-05 DIAGNOSIS — F411 Generalized anxiety disorder: Secondary | ICD-10-CM | POA: Diagnosis not present

## 2019-11-05 DIAGNOSIS — F5101 Primary insomnia: Secondary | ICD-10-CM

## 2019-11-05 DIAGNOSIS — I10 Essential (primary) hypertension: Secondary | ICD-10-CM

## 2019-11-05 DIAGNOSIS — G43009 Migraine without aura, not intractable, without status migrainosus: Secondary | ICD-10-CM

## 2019-11-05 DIAGNOSIS — R7989 Other specified abnormal findings of blood chemistry: Secondary | ICD-10-CM

## 2019-11-05 MED ORDER — BUTALBITAL-APAP-CAFFEINE 50-300-40 MG PO CAPS: 1.0000 | ORAL_CAPSULE | ORAL | 3 refills | Status: DC | PRN

## 2019-11-05 MED ORDER — ESZOPICLONE 3 MG PO TABS: 3.0000 mg | ORAL_TABLET | Freq: Every day | ORAL | 3 refills | Status: DC

## 2019-11-05 MED ORDER — ALPRAZOLAM 0.5 MG PO TABS: 0.5000 mg | ORAL_TABLET | Freq: Every day | ORAL | 2 refills | Status: DC | PRN

## 2019-11-05 NOTE — Progress Notes (Signed)
[facility] [place], [place] [address]  Internal MEDICINE  Office Visit Note  Patient Name: Yvette Humphrey  [id number]  [id number]  Date of Service: 11/05/2019  Chief Complaint  Patient presents with  . Follow-up  . Medication Refill  . Hypertension    HPI Pt is here for follow up on HTN and insomnia. BP is well controlled with metoprolol at this time.  Denies Chest pain, Shortness of breath, palpitations, headache, or blurred vision.  She reports continued issues with sleeping. She does not remember when she had a sleep study last.  She has gained some weight since then.  Her husband complains about her snoring when she does sleep.  Her watch is tracking her sleep and she is generally sleeping 90 minutes and waking up, or 30 minutes and waking up.  She does get up and use the restroom a few times each night.  She is exercising regularly 3 days a week. She does not use any screen devices before bed. Her migraines are decently controlled, she is requesting a refill on fiorcet at this time.   She has an elevated ferritin level. 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 She has not seen anyone since.    Current Medication: Outpatient Encounter Medications as of 11/05/2019  Medication Sig  . ALPRAZolam (XANAX) 0.5 MG tablet Take 1 tablet (0.5 mg total) by mouth daily as needed for anxiety.  . Butalbital-APAP-Caffeine (FIORICET) 50-300-40 MG CAPS Take 1 capsule by mouth as needed. migraines  . cetirizine (ZYRTEC) 10 MG tablet Take 10 mg by mouth daily.  . cyanocobalamin 500 MCG tablet Take 500 mcg by mouth daily.  Marland Kitchen dicyclomine (BENTYL) 10 MG capsule Take 1 to 2 capsules po TID prn cramping  . ergocalciferol (DRISDOL) 1.25 MG (50000 UT) capsule Take 1 capsule (50,000 Units total) by mouth once a week.  . Eszopiclone 3 MG TABS Take 1 tablet (3 mg total) by mouth at bedtime. Take immediately before bedtime  . fluconazole (DIFLUCAN) 150 MG tablet TAKE 1 TABLET BY MOUTH  1 TIME. REPEAT AGAIN IF SYMPTOMS PERSISTS  . Lysine 1000 MG TABS Take by mouth daily as needed.  . Magnesium 250 MG TABS Take by mouth daily.  . meloxicam (MOBIC) 15 MG tablet Take 1 tablet (15 mg total) by mouth daily.  . metoprolol succinate (TOPROL-XL) 50 MG 24 hr tablet Take 1 tablet (50 mg total) by mouth daily. Take with or immediately following a meal.  . POTASSIUM CHLORIDE PO Take 10 mcg by mouth daily.  . sertraline (ZOLOFT) 100 MG tablet TAKE 1 TABLET(100 MG) BY MOUTH DAILY  . traMADol (ULTRAM) 50 MG tablet TAKE 1 TABLET(50 MG) BY MOUTH EVERY 8 HOURS FOR UP TO 5 DAYS AS NEEDED  . [DISCONTINUED] ALPRAZolam (XANAX) 0.5 MG tablet Take 1 tablet (0.5 mg total) by mouth daily as needed for anxiety.  . [DISCONTINUED] Butalbital-APAP-Caffeine (FIORICET) 50-300-40 MG CAPS Take 1 capsule by mouth as needed. migraines  . [DISCONTINUED] clarithromycin (BIAXIN) 500 MG tablet Take 1 tablet (500 mg total) by mouth 2 (two) times daily.  . [DISCONTINUED] Eszopiclone 3 MG TABS Take 1 tablet (3 mg total) by mouth at bedtime. Take immediately before bedtime   No facility-administered encounter medications on file as of 11/05/2019.    Surgical History: Past Surgical History:  Procedure Laterality Date  . CHOLECYSTECTOMY    . ENDOMETRIAL ABLATION     approx 2009  . ESOPHAGOGASTRODUODENOSCOPY    .  REDUCTION MAMMAPLASTY Bilateral 2004  . TONSILLECTOMY    . TUBAL LIGATION    . ULNAR NERVE TRANSPOSITION Left 06/08/2016   Procedure: SUBCUTANEOUS ANTERIOR ULNAR NERVE TRANSPOSITION LEFT ELBOW;  Surgeon: Corky Mull, MD;  Location: [facility];  Service: Orthopedics;  Laterality: Left;    Medical History: Past Medical History:  Diagnosis Date  . Complication of anesthesia   . Headache    migraines, 1x/mo  . Hypertension   . Motion sickness    back seat cars  . PONV (postoperative nausea and vomiting)   . Tachycardia   . Wears contact lenses     Family History: Family History  Problem  Relation Age of Onset  . Breast cancer Paternal Grandmother     Social History   Socioeconomic History  . Marital status: Married    Spouse name: Not on file  . Number of children: Not on file  . Years of education: Not on file  . Highest education level: Not on file  Occupational History  . Not on file  Tobacco Use  . Smoking status: Never Smoker  . Smokeless tobacco: Never Used  Substance and Sexual Activity  . Alcohol use: Yes    Alcohol/week: 1.0 standard drinks    Types: 1 Glasses of wine per week    Comment: occasionally  . Drug use: No  . Sexual activity: Not on file  Other Topics Concern  . Not on file  Social History Narrative  . Not on file   Social Determinants of Health   Financial Resource Strain:   . Difficulty of Paying Living Expenses:   Food Insecurity:   . Worried About Charity fundraiser in the Last Year:   . Arboriculturist in the Last Year:   Transportation Needs:   . Film/video editor (Medical):   Marland Kitchen Lack of Transportation (Non-Medical):   Physical Activity:   . Days of Exercise per Week:   . Minutes of Exercise per Session:   Stress:   . Feeling of Stress :   Social Connections:   . Frequency of Communication with Friends and Family:   . Frequency of Social Gatherings with Friends and Family:   . Attends Religious Services:   . Active Member of Clubs or Organizations:   . Attends Archivist Meetings:   Marland Kitchen Marital Status:   Intimate Partner Violence:   . Fear of Current or Ex-Partner:   . Emotionally Abused:   Marland Kitchen Physically Abused:   . Sexually Abused:       Review of Systems  Constitutional: Negative for chills, fatigue and unexpected weight change.  HENT: Negative for congestion, rhinorrhea, sneezing and sore throat.   Eyes: Negative for photophobia, pain and redness.  Respiratory: Negative for cough, chest tightness and shortness of breath.   Cardiovascular: Negative for chest pain and palpitations.   Gastrointestinal: Negative for abdominal pain, constipation, diarrhea, nausea and vomiting.  Endocrine: Negative.   Genitourinary: Negative for dysuria and frequency.  Musculoskeletal: Negative for arthralgias, back pain, joint swelling and neck pain.  Skin: Negative for rash.  Allergic/Immunologic: Negative.   Neurological: Negative for tremors and numbness.  Hematological: Negative for adenopathy. Does not bruise/bleed easily.  Psychiatric/Behavioral: Negative for behavioral problems and sleep disturbance. The patient is not nervous/anxious.     Vital Signs: BP 130/72   Pulse 82   Temp (!) 97.4 F (36.3 C)   Resp 16   Ht 5' 3"  (1.6 m)   Wt  179 lb 3.2 oz (81.3 kg)   SpO2 97%   BMI 31.74 kg/m    Physical Exam Vitals and nursing note reviewed.  Constitutional:      General: She is not in acute distress.    Appearance: She is well-developed. She is not diaphoretic.  HENT:     Head: Normocephalic and atraumatic.     Mouth/Throat:     Pharynx: No oropharyngeal exudate.  Eyes:     Pupils: Pupils are equal, round, and reactive to light.  Neck:     Thyroid: No thyromegaly.     Vascular: No JVD.     Trachea: No tracheal deviation.  Cardiovascular:     Rate and Rhythm: Normal rate and regular rhythm.     Heart sounds: Normal heart sounds. No murmur. No friction rub. No gallop.   Pulmonary:     Effort: Pulmonary effort is normal. No respiratory distress.     Breath sounds: Normal breath sounds. No wheezing or rales.  Chest:     Chest wall: No tenderness.  Abdominal:     Palpations: Abdomen is soft.     Tenderness: There is no abdominal tenderness. There is no guarding.  Musculoskeletal:        General: Normal range of motion.     Cervical back: Normal range of motion and neck supple.  Lymphadenopathy:     Cervical: No cervical adenopathy.  Skin:    General: Skin is warm and dry.  Neurological:     Mental Status: She is alert and oriented to person, place, and time.      Cranial Nerves: No cranial nerve deficit.  Psychiatric:        Behavior: Behavior normal.        Thought Content: Thought content normal.        Judgment: Judgment normal.    Assessment/Plan: 1. Essential (primary) hypertension Stable, continue to monitor.  2. Loud snoring Will get home sleep study, given sleep issues and loud snoring to assess for osa.  - Home sleep test  3. Migraine without aura and without status migrainosus, not intractable Reviewed risks and possible side effects associated with taking opiates, benzodiazepines and other CNS depressants. Combination of these could cause dizziness and drowsiness. Advised patient not to drive or operate machinery when taking these medications, as patient's and other's life can be at risk and will have consequences. Patient verbalized understanding in this matter. Dependence and abuse for these drugs will be monitored closely. A Controlled substance policy and procedure is on file which allows [facility] to order a urine drug screen test at any visit. Patient understands and agrees with the plan - Butalbital-APAP-Caffeine (FIORICET) 50-300-40 MG CAPS; Take 1 capsule by mouth as needed. migraines  Dispense: 45 capsule; Refill: 3  4. GAD (generalized anxiety disorder) Reviewed risks and possible side effects associated with taking opiates, benzodiazepines and other CNS depressants. Combination of these could cause dizziness and drowsiness. Advised patient not to drive or operate machinery when taking these medications, as patient's and other's life can be at risk and will have consequences. Patient verbalized understanding in this matter. Dependence and abuse for these drugs will be monitored closely. A Controlled substance policy and procedure is on file which allows [facility] to order a urine drug screen test at any visit. Patient understands and agrees with the plan - ALPRAZolam (XANAX) 0.5 MG tablet; Take 1  tablet (0.5 mg total) by mouth daily as needed for anxiety.  Dispense:  30 tablet; Refill: 2  5. Primary insomnia Refilled Eszopiclone.  - Eszopiclone 3 MG TABS; Take 1 tablet (3 mg total) by mouth at bedtime. Take immediately before bedtime  Dispense: 30 tablet; Refill: 3  6. Elevated ferritin level Stable, will continue to monitor. Was worked up in the past, and told to continue exercising, and watching diet.   General Counseling: marliyah reid understanding of the findings of todays visit and agrees with plan of treatment. I have discussed any further diagnostic evaluation that may be needed or ordered today. We also reviewed her medications today. she has been encouraged to call the office with any questions or concerns that should arise related to todays visit.    Orders Placed This Encounter  Procedures  . Home sleep test    Meds ordered this encounter  Medications  . Butalbital-APAP-Caffeine (FIORICET) 50-300-40 MG CAPS    Sig: Take 1 capsule by mouth as needed. migraines    Dispense:  45 capsule    Refill:  3  . ALPRAZolam (XANAX) 0.5 MG tablet    Sig: Take 1 tablet (0.5 mg total) by mouth daily as needed for anxiety.    Dispense:  30 tablet    Refill:  2  . Eszopiclone 3 MG TABS    Sig: Take 1 tablet (3 mg total) by mouth at bedtime. Take immediately before bedtime    Dispense:  30 tablet    Refill:  3    D/c zolpidem CR - ineffective    Time spent: 25 Minutes   This patient was seen by Orson Gear AGNP-C in Collaboration with Dr Lavera Guise as a part of collaborative care agreement     Kendell Bane AGNP-C Internal medicine

## 2019-11-15 ENCOUNTER — Telehealth: Payer: Self-pay

## 2019-11-15 NOTE — Telephone Encounter (Signed)
Spoke with patient regarding sleep studies, her bcbs plan stated she does not participate with sleep management so I advised her I would hold onto authorization and she could call her insurance plan and make sure in future her studies would be covered, she will reach out to them next week and call back to schd home sleep test. Yvette Humphrey

## 2020-01-08 ENCOUNTER — Other Ambulatory Visit: Payer: Self-pay | Admitting: Adult Health

## 2020-01-08 DIAGNOSIS — B379 Candidiasis, unspecified: Secondary | ICD-10-CM

## 2020-01-24 ENCOUNTER — Other Ambulatory Visit: Payer: Self-pay

## 2020-01-24 DIAGNOSIS — I10 Essential (primary) hypertension: Secondary | ICD-10-CM

## 2020-01-24 MED ORDER — METOPROLOL SUCCINATE ER 50 MG PO TB24: 50.0000 mg | ORAL_TABLET | Freq: Every day | ORAL | 3 refills | Status: DC

## 2020-02-26 ENCOUNTER — Other Ambulatory Visit: Payer: Self-pay

## 2020-02-26 DIAGNOSIS — B379 Candidiasis, unspecified: Secondary | ICD-10-CM

## 2020-02-26 MED ORDER — FLUCONAZOLE 150 MG PO TABS: ORAL_TABLET | ORAL | 1 refills | Status: DC

## 2020-03-06 ENCOUNTER — Ambulatory Visit: Payer: BC Managed Care – PPO | Admitting: Nurse Practitioner

## 2020-03-06 ENCOUNTER — Other Ambulatory Visit: Payer: Self-pay

## 2020-03-06 VITALS — BP 141/79 | HR 85 | Temp 97.2°F | Ht 63.0 in | Wt 185.0 lb

## 2020-03-06 DIAGNOSIS — Z79899 Other long term (current) drug therapy: Secondary | ICD-10-CM | POA: Diagnosis not present

## 2020-03-06 DIAGNOSIS — F5101 Primary insomnia: Secondary | ICD-10-CM

## 2020-03-06 DIAGNOSIS — R0683 Snoring: Secondary | ICD-10-CM | POA: Diagnosis not present

## 2020-03-06 DIAGNOSIS — G43009 Migraine without aura, not intractable, without status migrainosus: Secondary | ICD-10-CM | POA: Diagnosis not present

## 2020-03-06 DIAGNOSIS — I10 Essential (primary) hypertension: Secondary | ICD-10-CM

## 2020-03-06 DIAGNOSIS — F411 Generalized anxiety disorder: Secondary | ICD-10-CM

## 2020-03-06 LAB — POCT URINE DRUG SCREEN
Methylenedioxyamphetamine: NOT DETECTED
POC Amphetamine UR: NOT DETECTED
POC BENZODIAZEPINES UR: NOT DETECTED
POC Barbiturate UR: NOT DETECTED
POC Cocaine UR: NOT DETECTED
POC Ecstasy UR: NOT DETECTED
POC Marijuana UR: NOT DETECTED
POC Methadone UR: NOT DETECTED
POC Methamphetamine UR: NOT DETECTED
POC Opiate Ur: NOT DETECTED
POC Oxycodone UR: NOT DETECTED
POC PHENCYCLIDINE UR: NOT DETECTED
POC TRICYCLICS UR: NOT DETECTED

## 2020-03-06 MED ORDER — TRAZODONE HCL 50 MG PO TABS: 25.0000 mg | ORAL_TABLET | Freq: Every evening | ORAL | 2 refills | Status: DC | PRN

## 2020-03-06 MED ORDER — ALPRAZOLAM 0.5 MG PO TABS: 0.5000 mg | ORAL_TABLET | Freq: Every day | ORAL | 2 refills | Status: DC | PRN

## 2020-03-06 NOTE — Progress Notes (Signed)
[facility] [place], [place] [address]  Internal MEDICINE  Office Visit Note  Patient Name: Yvette Humphrey  [id number]  [id number]  Date of Service: 03/30/2020  Chief Complaint  Patient presents with  . Follow-up  . Sleeping Problem    Questions about sleep study    The patient is here for routine follow up. She reports continued issues with sleeping. She does not remember when she had a sleep study last.  She has gained some weight since then. Her watch is tracking her sleep and she is generally sleeping 90 minutes and waking up, or 30 minutes and waking up.  She does get up and use the restroom a few times each night.  She is exercising regularly 3 days a week. She does not use any screen devices before bed. Was on ambien every night to help with sleep. this was not working. A change to lunesta was made at her last visit. She states that this has really not changed anything regarding sleep. Still waking up three and four times per night and has a hard time getting back to sleep. We did order a sleep study for further evaluation and her insurance would not pay for it. Out of pocket cost for the test is too much right now.       Current Medication: Outpatient Encounter Medications as of 03/06/2020  Medication Sig  . ALPRAZolam (XANAX) 0.5 MG tablet Take 1 tablet (0.5 mg total) by mouth daily as needed for anxiety.  . Butalbital-APAP-Caffeine (FIORICET) 50-300-40 MG CAPS Take 1 capsule by mouth as needed. migraines  . cetirizine (ZYRTEC) 10 MG tablet Take 10 mg by mouth daily.  . cyanocobalamin 500 MCG tablet Take 500 mcg by mouth daily.  Marland Kitchen dicyclomine (BENTYL) 10 MG capsule Take 1 to 2 capsules po TID prn cramping  . ergocalciferol (DRISDOL) 1.25 MG (50000 UT) capsule Take 1 capsule (50,000 Units total) by mouth once a week.  . fluconazole (DIFLUCAN) 150 MG tablet Take 1 tab po once  Repeat again if symptoms persist  . Lysine 1000 MG TABS Take by mouth daily  as needed.  . Magnesium 250 MG TABS Take by mouth daily.  . meloxicam (MOBIC) 15 MG tablet Take 1 tablet (15 mg total) by mouth daily.  . metoprolol succinate (TOPROL-XL) 50 MG 24 hr tablet Take 1 tablet (50 mg total) by mouth daily. Take with or immediately following a meal.  . POTASSIUM CHLORIDE PO Take 10 mcg by mouth daily.  . sertraline (ZOLOFT) 100 MG tablet TAKE 1 TABLET(100 MG) BY MOUTH DAILY  . traMADol (ULTRAM) 50 MG tablet TAKE 1 TABLET(50 MG) BY MOUTH EVERY 8 HOURS FOR UP TO 5 DAYS AS NEEDED  . [DISCONTINUED] ALPRAZolam (XANAX) 0.5 MG tablet Take 1 tablet (0.5 mg total) by mouth daily as needed for anxiety.  . [DISCONTINUED] Eszopiclone 3 MG TABS Take 1 tablet (3 mg total) by mouth at bedtime. Take immediately before bedtime  . traZODone (DESYREL) 50 MG tablet Take 0.5-1 tablets (25-50 mg total) by mouth at bedtime as needed for sleep.   No facility-administered encounter medications on file as of 03/06/2020.    Surgical History: Past Surgical History:  Procedure Laterality Date  . CHOLECYSTECTOMY    . ENDOMETRIAL ABLATION     approx 2009  . ESOPHAGOGASTRODUODENOSCOPY    . REDUCTION MAMMAPLASTY Bilateral 2004  . TONSILLECTOMY    . TUBAL LIGATION    . ULNAR NERVE TRANSPOSITION Left 06/08/2016  Procedure: SUBCUTANEOUS ANTERIOR ULNAR NERVE TRANSPOSITION LEFT ELBOW;  Surgeon: Corky Mull, MD;  Location: [facility];  Service: Orthopedics;  Laterality: Left;    Medical History: Past Medical History:  Diagnosis Date  . Complication of anesthesia   . Headache    migraines, 1x/mo  . Hypertension   . Motion sickness    back seat cars  . PONV (postoperative nausea and vomiting)   . Tachycardia   . Wears contact lenses     Family History: Family History  Problem Relation Age of Onset  . Breast cancer Paternal Grandmother     Social History   Socioeconomic History  . Marital status: Married    Spouse name: Not on file  . Number of children: Not on file  .  Years of education: Not on file  . Highest education level: Not on file  Occupational History  . Not on file  Tobacco Use  . Smoking status: Never Smoker  . Smokeless tobacco: Never Used  Vaping Use  . Vaping Use: Never used  Substance and Sexual Activity  . Alcohol use: Yes    Alcohol/week: 1.0 standard drink    Types: 1 Glasses of wine per week    Comment: occasionally  . Drug use: No  . Sexual activity: Not on file  Other Topics Concern  . Not on file  Social History Narrative  . Not on file   Social Determinants of Health   Financial Resource Strain:   . Difficulty of Paying Living Expenses: Not on file  Food Insecurity:   . Worried About Charity fundraiser in the Last Year: Not on file  . Ran Out of Food in the Last Year: Not on file  Transportation Needs:   . Lack of Transportation (Medical): Not on file  . Lack of Transportation (Non-Medical): Not on file  Physical Activity:   . Days of Exercise per Week: Not on file  . Minutes of Exercise per Session: Not on file  Stress:   . Feeling of Stress : Not on file  Social Connections:   . Frequency of Communication with Friends and Family: Not on file  . Frequency of Social Gatherings with Friends and Family: Not on file  . Attends Religious Services: Not on file  . Active Member of Clubs or Organizations: Not on file  . Attends Archivist Meetings: Not on file  . Marital Status: Not on file  Intimate Partner Violence:   . Fear of Current or Ex-Partner: Not on file  . Emotionally Abused: Not on file  . Physically Abused: Not on file  . Sexually Abused: Not on file      Review of Systems  Constitutional: Positive for fatigue and unexpected weight change. Negative for chills.  HENT: Negative for congestion, postnasal drip, rhinorrhea, sneezing and sore throat.   Respiratory: Negative for cough, chest tightness, shortness of breath and wheezing.   Cardiovascular: Negative for chest pain and  palpitations.  Gastrointestinal: Negative for abdominal pain, constipation, diarrhea, nausea and vomiting.  Endocrine: Negative for cold intolerance, heat intolerance, polydipsia and polyuria.  Musculoskeletal: Negative for arthralgias, back pain, joint swelling and neck pain.  Skin: Negative for rash.  Allergic/Immunologic: Positive for environmental allergies.  Neurological: Positive for headaches. Negative for dizziness, tremors and numbness.  Hematological: Negative for adenopathy. Does not bruise/bleed easily.  Psychiatric/Behavioral: Positive for sleep disturbance. Negative for behavioral problems (Depression) and suicidal ideas. The patient is nervous/anxious.     Today's Vitals  03/06/20 1115  BP: (!) 141/79  Pulse: 85  Temp: (!) 97.2 F (36.2 C)  SpO2: 97%  Weight: 185 lb (83.9 kg)  Height: 5' 3"  (1.6 m)   Body mass index is 32.77 kg/m. Physical Exam Vitals and nursing note reviewed.  Constitutional:      General: She is not in acute distress.    Appearance: Normal appearance. She is well-developed. She is obese. She is not diaphoretic.  HENT:     Head: Normocephalic and atraumatic.     Nose: Nose normal.     Mouth/Throat:     Pharynx: No oropharyngeal exudate.  Eyes:     Pupils: Pupils are equal, round, and reactive to light.  Neck:     Thyroid: No thyromegaly.     Vascular: No JVD.     Trachea: No tracheal deviation.  Cardiovascular:     Rate and Rhythm: Normal rate and regular rhythm.     Heart sounds: Normal heart sounds. No murmur heard.  No friction rub. No gallop.   Pulmonary:     Effort: Pulmonary effort is normal. No respiratory distress.     Breath sounds: Normal breath sounds. No wheezing or rales.  Chest:     Chest wall: No tenderness.  Abdominal:     Palpations: Abdomen is soft.  Musculoskeletal:        General: Normal range of motion.     Cervical back: Normal range of motion and neck supple.  Lymphadenopathy:     Cervical: No cervical  adenopathy.  Skin:    General: Skin is warm and dry.  Neurological:     Mental Status: She is alert and oriented to person, place, and time.     Cranial Nerves: No cranial nerve deficit.  Psychiatric:        Mood and Affect: Mood normal.        Behavior: Behavior normal.        Thought Content: Thought content normal.        Judgment: Judgment normal.     Assessment/Plan: 1. Essential (primary) hypertension Stable. Continue bp medication as prescribed   2. Primary insomnia Trial trazodone 24m at bedtime as needed for insomnia. Start with 0.5 tablet and may increase to 1 tablet as needed and as tolerated.  - traZODone (DESYREL) 50 MG tablet; Take 0.5-1 tablets (25-50 mg total) by mouth at bedtime as needed for sleep.  Dispense: 30 tablet; Refill: 2  3. Loud snoring Patient had baseline sleep study ordered, however, patient's insurance will not cover this study. Will monitor   4. Migraine without aura and without status migrainosus, not intractable Currently well managed.   5. GAD (generalized anxiety disorder) Continue sertraline as prescribed. May take take alprazolam 0.538monce daily if needed for acute anxiety. New prescription sent to her pharmacy today.  - ALPRAZolam (XANAX) 0.5 MG tablet; Take 1 tablet (0.5 mg total) by mouth daily as needed for anxiety.  Dispense: 30 tablet; Refill: 2  6. Encounter for long-term (current) use of medications - POCT Urine Drug Screen negative for all controlled substances today.   General Counseling: Suaniayah alaniznderstanding of the findings of todays visit and agrees with plan of treatment. I have discussed any further diagnostic evaluation that may be needed or ordered today. We also reviewed her medications today. she has been encouraged to call the office with any questions or concerns that should arise related to todays visit.  This patient was seen by [facility]  Dr Lavera Guise as a part of collaborative  care agreement  Orders Placed This Encounter  Procedures  . POCT Urine Drug Screen    Meds ordered this encounter  Medications  . ALPRAZolam (XANAX) 0.5 MG tablet    Sig: Take 1 tablet (0.5 mg total) by mouth daily as needed for anxiety.    Dispense:  30 tablet    Refill:  2    Order Specific Question:   Supervising Provider    Answer:   Lavera Guise [[id number]]  . traZODone (DESYREL) 50 MG tablet    Sig: Take 0.5-1 tablets (25-50 mg total) by mouth at bedtime as needed for sleep.    Dispense:  30 tablet    Refill:  2    Order Specific Question:   Supervising Provider    Answer:   Lavera Guise [[id number]]    Total time spent: 30 Minutes   Time spent includes review of chart, medications, test results, and follow up plan with the patient.      Dr Lavera Guise Internal medicine

## 2020-03-30 ENCOUNTER — Encounter: Payer: Self-pay | Admitting: Nurse Practitioner

## 2020-03-30 DIAGNOSIS — R0683 Snoring: Secondary | ICD-10-CM | POA: Insufficient documentation

## 2020-04-15 ENCOUNTER — Ambulatory Visit (INDEPENDENT_AMBULATORY_CARE_PROVIDER_SITE_OTHER): Payer: BC Managed Care – PPO | Admitting: Hospice and Palliative Medicine

## 2020-04-15 VITALS — Temp 99.0°F | Resp 16 | Ht 63.0 in | Wt 180.0 lb

## 2020-04-15 DIAGNOSIS — R05 Cough: Secondary | ICD-10-CM | POA: Diagnosis not present

## 2020-04-15 DIAGNOSIS — J0140 Acute pansinusitis, unspecified: Secondary | ICD-10-CM | POA: Diagnosis not present

## 2020-04-15 DIAGNOSIS — R059 Cough, unspecified: Secondary | ICD-10-CM

## 2020-04-15 DIAGNOSIS — E782 Mixed hyperlipidemia: Secondary | ICD-10-CM | POA: Diagnosis not present

## 2020-04-15 MED ORDER — PREDNISONE 10 MG PO TABS: ORAL_TABLET | ORAL | 0 refills | Status: DC

## 2020-04-15 MED ORDER — HYDROCOD POLST-CPM POLST ER 10-8 MG/5ML PO SUER: 5.0000 mL | Freq: Every evening | ORAL | 0 refills | Status: DC | PRN

## 2020-04-15 MED ORDER — AZITHROMYCIN 250 MG PO TABS: ORAL_TABLET | ORAL | 0 refills | Status: DC

## 2020-04-15 NOTE — Progress Notes (Signed)
[facility] [place], [place] [address]  Internal MEDICINE  Telephone Visit  Patient Name: Yvette Humphrey  [id number]  [id number]  Date of Service: 04/16/2020  I connected with the patient at 1141 by telephone and verified the patients identity using two identifiers.   I discussed the limitations, risks, security and privacy concerns of performing an evaluation and management service by telephone and the availability of in person appointments. I also discussed with the patient that there may be a patient responsible charge related to the service.  The patient expressed understanding and agrees to proceed.    Chief Complaint  Patient presents with  . Acute Visit    sore throat hurts to talk, headache, chest tight, ears feel full  . Telephone Assessment    [phone]   . Telephone Screen    video call  . Quality Metric Gaps    HIV screening, TDAP    HPI Patient is being seen today for a sick visit. She says that Monday night she noticed that her throat starting hurting. Sore throat has worsened and symptoms have progressed to sinus congestion, headache, chest tightness and cough as well as bilateral  ear fullness. Low grade fever around 99. Appointment tomorrow to get tested for COVID    Current Medication: Outpatient Encounter Medications as of 04/15/2020  Medication Sig  . ALPRAZolam (XANAX) 0.5 MG tablet Take 1 tablet (0.5 mg total) by mouth daily as needed for anxiety.  . Butalbital-APAP-Caffeine (FIORICET) 50-300-40 MG CAPS Take 1 capsule by mouth as needed. migraines  . cetirizine (ZYRTEC) 10 MG tablet Take 10 mg by mouth daily.  . cyanocobalamin 500 MCG tablet Take 500 mcg by mouth daily.  Marland Kitchen dicyclomine (BENTYL) 10 MG capsule Take 1 to 2 capsules po TID prn cramping  . fluconazole (DIFLUCAN) 150 MG tablet Take 1 tab po once  Repeat again if symptoms persist  . Lysine 1000 MG TABS Take by mouth daily as needed.  . Magnesium 250 MG TABS Take by  mouth daily.  . meloxicam (MOBIC) 15 MG tablet Take 1 tablet (15 mg total) by mouth daily.  . metoprolol succinate (TOPROL-XL) 50 MG 24 hr tablet Take 1 tablet (50 mg total) by mouth daily. Take with or immediately following a meal.  . POTASSIUM CHLORIDE PO Take 10 mcg by mouth daily.  . sertraline (ZOLOFT) 100 MG tablet TAKE 1 TABLET(100 MG) BY MOUTH DAILY  . traZODone (DESYREL) 50 MG tablet Take 0.5-1 tablets (25-50 mg total) by mouth at bedtime as needed for sleep.  Marland Kitchen azithromycin (ZITHROMAX Z-PAK) 250 MG tablet Take two 250 mg tablets on day one followed by one 250 mg tablet each day for four days.  . chlorpheniramine-HYDROcodone (TUSSIONEX PENNKINETIC ER) 10-8 MG/5ML SUER Take 5 mLs by mouth at bedtime as needed for cough.  . ergocalciferol (DRISDOL) 1.25 MG (50000 UT) capsule Take 1 capsule (50,000 Units total) by mouth once a week. (Patient not taking: Reported on 04/15/2020)  . predniSONE (DELTASONE) 10 MG tablet Take 1 tablet three times a day with a meal for three for three days, take 1 tablet by twice daily with a meal for 3 days, take 1 tablet once daily with a meal for 3 days  . traMADol (ULTRAM) 50 MG tablet TAKE 1 TABLET(50 MG) BY MOUTH EVERY 8 HOURS FOR UP TO 5 DAYS AS NEEDED (Patient not taking: Reported on 04/15/2020)   No facility-administered encounter medications on file as of 04/15/2020.    Surgical  History: Past Surgical History:  Procedure Laterality Date  . CHOLECYSTECTOMY    . ENDOMETRIAL ABLATION     approx 2009  . ESOPHAGOGASTRODUODENOSCOPY    . REDUCTION MAMMAPLASTY Bilateral 2004  . TONSILLECTOMY    . TUBAL LIGATION    . ULNAR NERVE TRANSPOSITION Left 06/08/2016   Procedure: SUBCUTANEOUS ANTERIOR ULNAR NERVE TRANSPOSITION LEFT ELBOW;  Surgeon: Corky Mull, MD;  Location: [facility];  Service: Orthopedics;  Laterality: Left;    Medical History: Past Medical History:  Diagnosis Date  . Complication of anesthesia   . Headache    migraines, 1x/mo  .  Hypertension   . Motion sickness    back seat cars  . PONV (postoperative nausea and vomiting)   . Tachycardia   . Wears contact lenses     Family History: Family History  Problem Relation Age of Onset  . Breast cancer Paternal Grandmother     Social History   Socioeconomic History  . Marital status: Married    Spouse name: Not on file  . Number of children: Not on file  . Years of education: Not on file  . Highest education level: Not on file  Occupational History  . Not on file  Tobacco Use  . Smoking status: Never Smoker  . Smokeless tobacco: Never Used  Vaping Use  . Vaping Use: Never used  Substance and Sexual Activity  . Alcohol use: Yes    Alcohol/week: 1.0 standard drink    Types: 1 Glasses of wine per week    Comment: occasionally  . Drug use: No  . Sexual activity: Not on file  Other Topics Concern  . Not on file  Social History Narrative  . Not on file   Social Determinants of Health   Financial Resource Strain:   . Difficulty of Paying Living Expenses: Not on file  Food Insecurity:   . Worried About Charity fundraiser in the Last Year: Not on file  . Ran Out of Food in the Last Year: Not on file  Transportation Needs:   . Lack of Transportation (Medical): Not on file  . Lack of Transportation (Non-Medical): Not on file  Physical Activity:   . Days of Exercise per Week: Not on file  . Minutes of Exercise per Session: Not on file  Stress:   . Feeling of Stress : Not on file  Social Connections:   . Frequency of Communication with Friends and Family: Not on file  . Frequency of Social Gatherings with Friends and Family: Not on file  . Attends Religious Services: Not on file  . Active Member of Clubs or Organizations: Not on file  . Attends Archivist Meetings: Not on file  . Marital Status: Not on file  Intimate Partner Violence:   . Fear of Current or Ex-Partner: Not on file  . Emotionally Abused: Not on file  . Physically Abused:  Not on file  . Sexually Abused: Not on file   Review of Systems  Constitutional: Positive for fatigue. Negative for chills and diaphoresis.  HENT: Positive for congestion, ear pain, sinus pressure, sinus pain and sore throat. Negative for postnasal drip.   Eyes: Negative for photophobia, discharge, redness, itching and visual disturbance.  Respiratory: Positive for cough. Negative for shortness of breath and wheezing.   Cardiovascular: Negative for chest pain, palpitations and leg swelling.  Gastrointestinal: Negative for abdominal pain, constipation, diarrhea, nausea and vomiting.  Genitourinary: Negative for dysuria and flank pain.  Musculoskeletal:  Negative for arthralgias, back pain, gait problem and neck pain.  Skin: Negative for color change.  Allergic/Immunologic: Negative for environmental allergies and food allergies.  Neurological: Positive for headaches. Negative for dizziness, tremors and weakness.  Hematological: Does not bruise/bleed easily.  Psychiatric/Behavioral: Negative for agitation, behavioral problems (depression) and hallucinations.    Vital Signs: Temp 99 F (37.2 C)   Resp 16   Ht 5' 3"  (1.6 m)   Wt 180 lb (81.6 kg)   BMI 31.89 kg/m    Observation/Objective: No acute distress noted, ill-appearing on video.   Assessment/Plan: 1. Acute non-recurrent pansinusitis Treat with course of Zithromax as well as short course taper of prednisone. Advised to contact office if symptoms worsen or have not improved. - azithromycin (ZITHROMAX Z-PAK) 250 MG tablet; Take two 250 mg tablets on day one followed by one 250 mg tablet each day for four days.  Dispense: 6 each; Refill: 0 - predniSONE (DELTASONE) 10 MG tablet; Take 1 tablet three times a day with a meal for three for three days, take 1 tablet by twice daily with a meal for 3 days, take 1 tablet once daily with a meal for 3 days  Dispense: 18 tablet; Refill: 0  2. Cough Advised to use Tussinex at night to help  with sleep disturbance from cough. Discussed to use sparingly during the day and importance of not suppressing cough, increasing risk of further pulmonary complications. - chlorpheniramine-HYDROcodone (TUSSIONEX PENNKINETIC ER) 10-8 MG/5ML SUER; Take 5 mLs by mouth at bedtime as needed for cough.  Dispense: 140 mL; Refill: 0  3. Mixed hyperlipidemia Will need to address at next visit, CPE in November. Elevated cholesterol, triglycerides as well as LDL levels on labs in February. May need to consider statin therapy and/or lifestyle modifications.  General Counseling: vernice mannina understanding of the findings of today's phone visit and agrees with plan of treatment. I have discussed any further diagnostic evaluation that may be needed or ordered today. We also reviewed her medications today. she has been encouraged to call the office with any questions or concerns that should arise related to todays visit.  Reviewed risks and possible side effects associated with taking opiates, benzodiazepines and other CNS depressants. Combination of these could cause dizziness and drowsiness. Advised patient not to drive or operate machinery when taking these medications, as patient's and other's life can be at risk and will have consequences. Patient verbalized understanding in this matter. Dependence and abuse for these drugs will be monitored closely. A Controlled substance policy and procedure is on file which allows [facility] to order a urine drug screen test at any visit. Patient understands and agrees with the plan    Meds ordered this encounter  Medications  . azithromycin (ZITHROMAX Z-PAK) 250 MG tablet    Sig: Take two 250 mg tablets on day one followed by one 250 mg tablet each day for four days.    Dispense:  6 each    Refill:  0  . chlorpheniramine-HYDROcodone (TUSSIONEX PENNKINETIC ER) 10-8 MG/5ML SUER    Sig: Take 5 mLs by mouth at bedtime as needed for cough.    Dispense:  140  mL    Refill:  0  . predniSONE (DELTASONE) 10 MG tablet    Sig: Take 1 tablet three times a day with a meal for three for three days, take 1 tablet by twice daily with a meal for 3 days, take 1 tablet once daily with a meal for 3 days  Dispense:  18 tablet    Refill:  0     Time spent: Alba. Valerya Maxton AGNP-C Internal medicine

## 2020-04-16 ENCOUNTER — Encounter: Payer: Self-pay | Admitting: Hospice and Palliative Medicine

## 2020-04-18 ENCOUNTER — Other Ambulatory Visit: Payer: Self-pay | Admitting: Adult Health

## 2020-04-18 DIAGNOSIS — F411 Generalized anxiety disorder: Secondary | ICD-10-CM

## 2020-05-06 ENCOUNTER — Ambulatory Visit
Admission: RE | Admit: 2020-05-06 | Discharge: 2020-05-06 | Disposition: A | Discharge status: Home / Self Care | Payer: BC Managed Care – PPO | Source: Ambulatory Visit | Attending: Hospice and Palliative Medicine | Admitting: Hospice and Palliative Medicine

## 2020-05-06 ENCOUNTER — Other Ambulatory Visit: Payer: Self-pay

## 2020-05-06 ENCOUNTER — Other Ambulatory Visit: Payer: Self-pay | Admitting: Hospice and Palliative Medicine

## 2020-05-06 ENCOUNTER — Telehealth: Payer: Self-pay

## 2020-05-06 ENCOUNTER — Ambulatory Visit
Admission: RE | Admit: 2020-05-06 | Discharge: 2020-05-06 | Disposition: A | Discharge status: Home / Self Care | Payer: BC Managed Care – PPO | Attending: Hospice and Palliative Medicine | Admitting: Hospice and Palliative Medicine

## 2020-05-06 DIAGNOSIS — R05 Cough: Secondary | ICD-10-CM | POA: Diagnosis present

## 2020-05-06 DIAGNOSIS — R059 Cough, unspecified: Secondary | ICD-10-CM

## 2020-05-06 NOTE — Telephone Encounter (Signed)
Patient advised about chest x ray order and follow up 05/07/20. beth

## 2020-05-07 ENCOUNTER — Encounter: Payer: Self-pay | Admitting: Hospice and Palliative Medicine

## 2020-05-07 ENCOUNTER — Ambulatory Visit: Payer: BC Managed Care – PPO | Admitting: Hospice and Palliative Medicine

## 2020-05-07 DIAGNOSIS — J0141 Acute recurrent pansinusitis: Secondary | ICD-10-CM | POA: Diagnosis not present

## 2020-05-07 DIAGNOSIS — R059 Cough, unspecified: Secondary | ICD-10-CM

## 2020-05-07 DIAGNOSIS — R05 Cough: Secondary | ICD-10-CM | POA: Diagnosis not present

## 2020-05-07 MED ORDER — BENZONATATE 200 MG PO CAPS: 200.0000 mg | ORAL_CAPSULE | Freq: Two times a day (BID) | ORAL | 0 refills | Status: DC | PRN

## 2020-05-07 MED ORDER — METHYLPREDNISOLONE ACETATE 80 MG/ML IJ SUSP
60.0000 mg | Freq: Once | INTRAMUSCULAR | Status: AC
  Administered 2020-05-07: 60 mg via INTRAMUSCULAR

## 2020-05-07 MED ORDER — HYDROCOD POLST-CPM POLST ER 10-8 MG/5ML PO SUER: 5.0000 mL | Freq: Every evening | ORAL | 0 refills | Status: DC | PRN

## 2020-05-07 MED ORDER — CLARITHROMYCIN 250 MG PO TABS: 250.0000 mg | ORAL_TABLET | Freq: Two times a day (BID) | ORAL | 0 refills | Status: DC

## 2020-05-07 MED ORDER — PREDNISONE 5 MG PO TABS: 5.0000 mg | ORAL_TABLET | Freq: Every day | ORAL | 0 refills | Status: DC

## 2020-05-07 NOTE — Progress Notes (Signed)
[facility] [place], [place] [address]  Internal MEDICINE  Office Visit Note  Patient Name: Yvette Humphrey  [id number]  [id number]  Date of Service: 05/08/2020  Chief Complaint  Patient presents with  . Acute Visit    review labs, cough, tue night chills, fever of 100, achey, fever broke wed morning, neg covid test sept 11th  . Hypertension  . Quality Metric Gaps    TDAP     HPI Pt is here for a sick visit. She was seen 9/9 for a sick visit, she did feel some better after being treated with azithromycin as well as prednisone, felt better for about a week and then symptoms returned She complains about extreme body aches accompanied by a fever, TMax 101, and a nagging cough Her cough is worse at night disrupting her sleep at times, cough is dry and at times her cough is productive She does have seasonal allergies but her symptoms are usually very mild  She is concerned because of her symptoms returning and says she has never felt so bad before  COVID negative 9/10  Current Medication:  Outpatient Encounter Medications as of 05/07/2020  Medication Sig  . ALPRAZolam (XANAX) 0.5 MG tablet Take 1 tablet (0.5 mg total) by mouth daily as needed for anxiety.  . benzonatate (TESSALON) 200 MG capsule Take 1 capsule (200 mg total) by mouth 2 (two) times daily as needed for cough.  . Butalbital-APAP-Caffeine (FIORICET) 50-300-40 MG CAPS Take 1 capsule by mouth as needed. migraines  . cetirizine (ZYRTEC) 10 MG tablet Take 10 mg by mouth daily.  . chlorpheniramine-HYDROcodone (TUSSIONEX PENNKINETIC ER) 10-8 MG/5ML SUER Take 5 mLs by mouth at bedtime as needed for cough.  . clarithromycin (BIAXIN) 250 MG tablet Take 1 tablet (250 mg total) by mouth 2 (two) times daily.  . cyanocobalamin 500 MCG tablet Take 500 mcg by mouth daily.  Marland Kitchen dicyclomine (BENTYL) 10 MG capsule Take 1 to 2 capsules po TID prn cramping  . fluconazole (DIFLUCAN) 150 MG tablet Take 1 tab po  once  Repeat again if symptoms persist (Patient not taking: Reported on 05/07/2020)  . Lysine 1000 MG TABS Take by mouth daily as needed.  . Magnesium 250 MG TABS Take by mouth daily.  . meloxicam (MOBIC) 15 MG tablet Take 1 tablet (15 mg total) by mouth daily.  . metoprolol succinate (TOPROL-XL) 50 MG 24 hr tablet Take 1 tablet (50 mg total) by mouth daily. Take with or immediately following a meal.  . POTASSIUM CHLORIDE PO Take 10 mcg by mouth daily.  . predniSONE (DELTASONE) 5 MG tablet Take 1 tablet (5 mg total) by mouth daily with breakfast.  . sertraline (ZOLOFT) 100 MG tablet TAKE 1 TABLET(100 MG) BY MOUTH DAILY  . traZODone (DESYREL) 50 MG tablet Take 0.5-1 tablets (25-50 mg total) by mouth at bedtime as needed for sleep.  . [DISCONTINUED] azithromycin (ZITHROMAX Z-PAK) 250 MG tablet Take two 250 mg tablets on day one followed by one 250 mg tablet each day for four days.  . [DISCONTINUED] chlorpheniramine-HYDROcodone (TUSSIONEX PENNKINETIC ER) 10-8 MG/5ML SUER Take 5 mLs by mouth at bedtime as needed for cough.  . [DISCONTINUED] ergocalciferol (DRISDOL) 1.25 MG (50000 UT) capsule Take 1 capsule (50,000 Units total) by mouth once a week. (Patient not taking: Reported on 04/15/2020)  . [DISCONTINUED] predniSONE (DELTASONE) 10 MG tablet Take 1 tablet three times a day with a meal for three for three days, take 1 tablet by twice  daily with a meal for 3 days, take 1 tablet once daily with a meal for 3 days (Patient not taking: Reported on 05/07/2020)  . [DISCONTINUED] traMADol (ULTRAM) 50 MG tablet TAKE 1 TABLET(50 MG) BY MOUTH EVERY 8 HOURS FOR UP TO 5 DAYS AS NEEDED (Patient not taking: Reported on 04/15/2020)  . [EXPIRED] methylPREDNISolone acetate (DEPO-MEDROL) injection 60 mg    No facility-administered encounter medications on file as of 05/07/2020.      Medical History: Past Medical History:  Diagnosis Date  . Complication of anesthesia   . Headache    migraines, 1x/mo  . Hypertension    . Motion sickness    back seat cars  . PONV (postoperative nausea and vomiting)   . Tachycardia   . Wears contact lenses    Vital Signs: BP (!) 142/81   Pulse 83   Temp 97.7 F (36.5 C)   Resp 16   Ht 5' 3"  (1.6 m)   Wt 187 lb 3.2 oz (84.9 kg)   SpO2 97%   BMI 33.16 kg/m    Review of Systems  Constitutional: Positive for chills, diaphoresis, fatigue and fever.  HENT: Positive for congestion, postnasal drip, sinus pressure, sinus pain, sore throat and voice change. Negative for ear pain.   Eyes: Negative for photophobia, discharge, redness, itching and visual disturbance.  Respiratory: Positive for cough. Negative for shortness of breath and wheezing.   Cardiovascular: Negative for chest pain, palpitations and leg swelling.  Gastrointestinal: Negative for abdominal pain, constipation, diarrhea, nausea and vomiting.  Genitourinary: Negative for dysuria and flank pain.  Musculoskeletal: Negative for arthralgias, back pain, gait problem and neck pain.  Skin: Negative for color change.  Allergic/Immunologic: Negative for environmental allergies and food allergies.  Neurological: Positive for headaches. Negative for dizziness, weakness and numbness.  Hematological: Does not bruise/bleed easily.  Psychiatric/Behavioral: Negative for agitation, behavioral problems (depression) and hallucinations.    Physical Exam Vitals reviewed.  Constitutional:      Appearance: She is ill-appearing.  HENT:     Nose: Congestion present.     Mouth/Throat:     Pharynx: Posterior oropharyngeal erythema present.  Cardiovascular:     Rate and Rhythm: Normal rate and regular rhythm.     Pulses: Normal pulses.     Heart sounds: Normal heart sounds.  Pulmonary:     Effort: Pulmonary effort is normal.     Breath sounds: Normal breath sounds.  Abdominal:     General: Abdomen is flat.     Palpations: Abdomen is soft.  Musculoskeletal:        General: Normal range of motion.     Cervical back:  Normal range of motion.  Skin:    General: Skin is warm.  Neurological:     General: No focal deficit present.     Mental Status: She is alert and oriented to person, place, and time. Mental status is at baseline.  Psychiatric:        Mood and Affect: Mood normal.        Behavior: Behavior normal.        Thought Content: Thought content normal.    Assessment/Plan: 1. Acute recurrent pansinusitis Due to severity and recurrence of symptoms will treat with 5 day course of Clarithromycin as well as 7 day course of prednisone. Depo injection given in office today. - predniSONE (DELTASONE) 5 MG tablet; Take 1 tablet (5 mg total) by mouth daily with breakfast.  Dispense: 7 tablet; Refill: 0 - clarithromycin (BIAXIN) 250  MG tablet; Take 1 tablet (250 mg total) by mouth 2 (two) times daily.  Dispense: 10 tablet; Refill: 0 - methylPREDNISolone acetate (DEPO-MEDROL) injection 60 mg  2. Cough Due to severity of cough will refill Tussionex to be taken only as needed at night for coughing, benzonatate to be taken during the day as needed for cough. Will review results of CXR and adjust plan of care as needed. - chlorpheniramine-HYDROcodone (TUSSIONEX PENNKINETIC ER) 10-8 MG/5ML SUER; Take 5 mLs by mouth at bedtime as needed for cough.  Dispense: 140 mL; Refill: 0 - benzonatate (TESSALON) 200 MG capsule; Take 1 capsule (200 mg total) by mouth 2 (two) times daily as needed for cough.  Dispense: 20 capsule; Refill: 0 - methylPREDNISolone acetate (DEPO-MEDROL) injection 60 mg  General Counseling: kenisha lynds understanding of the findings of todays visit and agrees with plan of treatment. I have discussed any further diagnostic evaluation that may be needed or ordered today. We also reviewed her medications today. she has been encouraged to call the office with any questions or concerns that should arise related to todays visit.  Meds ordered this encounter  Medications  . predniSONE (DELTASONE) 5 MG  tablet    Sig: Take 1 tablet (5 mg total) by mouth daily with breakfast.    Dispense:  7 tablet    Refill:  0  . clarithromycin (BIAXIN) 250 MG tablet    Sig: Take 1 tablet (250 mg total) by mouth 2 (two) times daily.    Dispense:  10 tablet    Refill:  0  . chlorpheniramine-HYDROcodone (TUSSIONEX PENNKINETIC ER) 10-8 MG/5ML SUER    Sig: Take 5 mLs by mouth at bedtime as needed for cough.    Dispense:  140 mL    Refill:  0  . benzonatate (TESSALON) 200 MG capsule    Sig: Take 1 capsule (200 mg total) by mouth 2 (two) times daily as needed for cough.    Dispense:  20 capsule    Refill:  0  . methylPREDNISolone acetate (DEPO-MEDROL) injection 60 mg    Time spent: 30 Minutes Time spent includes review of chart, medications, test results and follow-up plan with the patient.  This patient was seen by Theodoro Grist AGNP-C in Collaboration with Dr Lavera Guise as a part of collaborative care agreement.  Tanna Furry [facility]

## 2020-05-08 ENCOUNTER — Encounter: Payer: Self-pay | Admitting: Hospice and Palliative Medicine

## 2020-05-21 ENCOUNTER — Encounter: Payer: Self-pay | Admitting: Hospice and Palliative Medicine

## 2020-05-21 ENCOUNTER — Ambulatory Visit: Payer: BC Managed Care – PPO | Admitting: Internal Medicine

## 2020-05-21 VITALS — HR 95 | Resp 16 | Ht 63.0 in | Wt 183.0 lb

## 2020-05-21 DIAGNOSIS — J4540 Moderate persistent asthma, uncomplicated: Secondary | ICD-10-CM | POA: Diagnosis not present

## 2020-05-21 DIAGNOSIS — J301 Allergic rhinitis due to pollen: Secondary | ICD-10-CM

## 2020-05-21 DIAGNOSIS — K219 Gastro-esophageal reflux disease without esophagitis: Secondary | ICD-10-CM

## 2020-05-21 MED ORDER — MONTELUKAST SODIUM 10 MG PO TABS: 10.0000 mg | ORAL_TABLET | Freq: Every day | ORAL | 3 refills | Status: DC

## 2020-05-21 MED ORDER — FLOVENT HFA 110 MCG/ACT IN AERO: INHALATION_SPRAY | RESPIRATORY_TRACT | 12 refills | Status: DC

## 2020-05-21 NOTE — Progress Notes (Signed)
[facility] [place], [place] [address]  Internal MEDICINE  Telephone Visit  Patient Name: Yvette Humphrey  [id number]  [id number]  Date of Service: 05/21/2020  I connected with the patient at 445pm by telephone and verified the patients identity using two identifiers.   I discussed the limitations, risks, security and privacy concerns of performing an evaluation and management service by telephone and the availability of in person appointments. I also discussed with the patient that there may be a patient responsible charge related to the service.  The patient expressed understanding and agrees to proceed.    Chief Complaint  Patient presents with  . Follow-up    cold that wont go away since Sept.7th  . Telephone Assessment    [phone]  . Telephone Screen    video call  . Hypertension    HPI Pt is connected sue to concerns of on going cough, she has been on abx and cough suppressants, since September, cough gets worse at night, has PND, she does have Flonase at home but does not uses it regularly, has h/o allergies, CXR is normal, no covid history or exposure, denies any fever or chills, does not have GERD symptoms, no h/o childhood asthma   Current Medication: Outpatient Encounter Medications as of 05/21/2020  Medication Sig  . ALPRAZolam (XANAX) 0.5 MG tablet Take 1 tablet (0.5 mg total) by mouth daily as needed for anxiety.  . Butalbital-APAP-Caffeine (FIORICET) 50-300-40 MG CAPS Take 1 capsule by mouth as needed. migraines  . cetirizine (ZYRTEC) 10 MG tablet Take 10 mg by mouth daily.  . chlorpheniramine-HYDROcodone (TUSSIONEX PENNKINETIC ER) 10-8 MG/5ML SUER Take 5 mLs by mouth at bedtime as needed for cough.  . cyanocobalamin 500 MCG tablet Take 500 mcg by mouth daily.  Marland Kitchen dicyclomine (BENTYL) 10 MG capsule Take 1 to 2 capsules po TID prn cramping  . fluconazole (DIFLUCAN) 150 MG tablet Take 1 tab po once  Repeat again if symptoms persist  .  Lysine 1000 MG TABS Take by mouth daily as needed.  . Magnesium 250 MG TABS Take by mouth daily.  . meloxicam (MOBIC) 15 MG tablet Take 1 tablet (15 mg total) by mouth daily.  . metoprolol succinate (TOPROL-XL) 50 MG 24 hr tablet Take 1 tablet (50 mg total) by mouth daily. Take with or immediately following a meal.  . POTASSIUM CHLORIDE PO Take 10 mcg by mouth daily.  . sertraline (ZOLOFT) 100 MG tablet TAKE 1 TABLET(100 MG) BY MOUTH DAILY  . traZODone (DESYREL) 50 MG tablet Take 0.5-1 tablets (25-50 mg total) by mouth at bedtime as needed for sleep.  . fluticasone (FLOVENT HFA) 110 MCG/ACT inhaler Take 2 puffs twice aday for reactive airway disease  . montelukast (SINGULAIR) 10 MG tablet Take 1 tablet (10 mg total) by mouth at bedtime.  . [DISCONTINUED] benzonatate (TESSALON) 200 MG capsule Take 1 capsule (200 mg total) by mouth 2 (two) times daily as needed for cough. (Patient not taking: Reported on 05/21/2020)  . [DISCONTINUED] clarithromycin (BIAXIN) 250 MG tablet Take 1 tablet (250 mg total) by mouth 2 (two) times daily.  . [DISCONTINUED] predniSONE (DELTASONE) 5 MG tablet Take 1 tablet (5 mg total) by mouth daily with breakfast.   No facility-administered encounter medications on file as of 05/21/2020.    Surgical History: Past Surgical History:  Procedure Laterality Date  . CHOLECYSTECTOMY    . ENDOMETRIAL ABLATION     approx 2009  . ESOPHAGOGASTRODUODENOSCOPY    . REDUCTION  MAMMAPLASTY Bilateral 2004  . TONSILLECTOMY    . TUBAL LIGATION    . ULNAR NERVE TRANSPOSITION Left 06/08/2016   Procedure: SUBCUTANEOUS ANTERIOR ULNAR NERVE TRANSPOSITION LEFT ELBOW;  Surgeon: Corky Mull, MD;  Location: [place];  Service: Orthopedics;  Laterality: Left;    Medical History: Past Medical History:  Diagnosis Date  . Complication of anesthesia   . Headache    migraines, 1x/mo  . Hypertension   . Motion sickness    back seat cars  . PONV (postoperative nausea and vomiting)    . Tachycardia   . Wears contact lenses     Family History: Family History  Problem Relation Age of Onset  . Breast cancer Paternal Grandmother     Social History   Socioeconomic History  . Marital status: Married    Spouse name: Not on file  . Number of children: Not on file  . Years of education: Not on file  . Highest education level: Not on file  Occupational History  . Not on file  Tobacco Use  . Smoking status: Never Smoker  . Smokeless tobacco: Never Used  Vaping Use  . Vaping Use: Never used  Substance and Sexual Activity  . Alcohol use: Yes    Alcohol/week: 1.0 standard drink    Types: 1 Glasses of wine per week    Comment: occasionally  . Drug use: No  . Sexual activity: Not on file  Other Topics Concern  . Not on file  Social History Narrative  . Not on file   Social Determinants of Health   Financial Resource Strain:   . Difficulty of Paying Living Expenses: Not on file  Food Insecurity:   . Worried About Charity fundraiser in the Last Year: Not on file  . Ran Out of Food in the Last Year: Not on file  Transportation Needs:   . Lack of Transportation (Medical): Not on file  . Lack of Transportation (Non-Medical): Not on file  Physical Activity:   . Days of Exercise per Week: Not on file  . Minutes of Exercise per Session: Not on file  Stress:   . Feeling of Stress : Not on file  Social Connections:   . Frequency of Communication with Friends and Family: Not on file  . Frequency of Social Gatherings with Friends and Family: Not on file  . Attends Religious Services: Not on file  . Active Member of Clubs or Organizations: Not on file  . Attends Archivist Meetings: Not on file  . Marital Status: Not on file  Intimate Partner Violence:   . Fear of Current or Ex-Partner: Not on file  . Emotionally Abused: Not on file  . Physically Abused: Not on file  . Sexually Abused: Not on file    Review of Systems  Constitutional: Negative  for fatigue.  HENT: Positive for postnasal drip and rhinorrhea.   Eyes: Negative for photophobia.  Respiratory: Positive for cough.   Cardiovascular: Negative for chest pain.  Gastrointestinal: Negative for blood in stool.  Neurological: Negative.     Vital Signs: Pulse 95   Resp 16   Ht 5' 3"  (1.6 m)   Wt 183 lb (83 kg)   BMI 32.42 kg/m    Observation/Objective:  pt has PND, otherwise NAD    Assessment/Plan: 1. Non-seasonal allergic rhinitis due to pollen Pt has persistent symptoms of allergies, will add Flonase QD along with Zyrtec, will need allergy testing   2. Moderate  persistent reactive airway disease without complication Add Singulair and Flovent, will need allergy testing and PFT. CXR is normal     3. Gastroesophageal reflux disease without esophagitis She was instructed to take her prilosec every day   General Counseling: noreene boreman understanding of the findings of today's phone visit and agrees with plan of treatment. I have discussed any further diagnostic evaluation that may be needed or ordered today. We also reviewed her medications today. she has been encouraged to call the office with any questions or concerns that should arise related to todays visit.  Meds ordered this encounter  Medications  . montelukast (SINGULAIR) 10 MG tablet    Sig: Take 1 tablet (10 mg total) by mouth at bedtime.    Dispense:  30 tablet    Refill:  3  . fluticasone (FLOVENT HFA) 110 MCG/ACT inhaler    Sig: Take 2 puffs twice aday for reactive airway disease    Dispense:  1 each    Refill:  12    Time spent:25 Minutes    Dr Lavera Guise Internal medicine

## 2020-05-28 ENCOUNTER — Other Ambulatory Visit: Payer: Self-pay

## 2020-05-28 DIAGNOSIS — I10 Essential (primary) hypertension: Secondary | ICD-10-CM

## 2020-05-28 MED ORDER — METOPROLOL SUCCINATE ER 50 MG PO TB24: 50.0000 mg | ORAL_TABLET | Freq: Every day | ORAL | 3 refills | Status: DC

## 2020-06-11 ENCOUNTER — Ambulatory Visit (INDEPENDENT_AMBULATORY_CARE_PROVIDER_SITE_OTHER): Payer: BC Managed Care – PPO | Admitting: Nurse Practitioner

## 2020-06-11 ENCOUNTER — Other Ambulatory Visit: Payer: Self-pay

## 2020-06-11 ENCOUNTER — Encounter: Payer: Self-pay | Admitting: Nurse Practitioner

## 2020-06-11 VITALS — BP 129/90 | HR 88 | Temp 97.7°F | Resp 16 | Ht 63.0 in | Wt 187.4 lb

## 2020-06-11 DIAGNOSIS — G5622 Lesion of ulnar nerve, left upper limb: Secondary | ICD-10-CM

## 2020-06-11 DIAGNOSIS — F331 Major depressive disorder, recurrent, moderate: Secondary | ICD-10-CM

## 2020-06-11 DIAGNOSIS — I10 Essential (primary) hypertension: Secondary | ICD-10-CM

## 2020-06-11 DIAGNOSIS — F5101 Primary insomnia: Secondary | ICD-10-CM

## 2020-06-11 DIAGNOSIS — Z0001 Encounter for general adult medical examination with abnormal findings: Secondary | ICD-10-CM

## 2020-06-11 MED ORDER — BUPROPION HCL 75 MG PO TABS: 75.0000 mg | ORAL_TABLET | Freq: Two times a day (BID) | ORAL | 1 refills | Status: DC

## 2020-06-11 MED ORDER — GABAPENTIN 100 MG PO CAPS: ORAL_CAPSULE | ORAL | 3 refills | Status: DC

## 2020-06-11 MED ORDER — TRAZODONE HCL 100 MG PO TABS: 100.0000 mg | ORAL_TABLET | Freq: Every evening | ORAL | 2 refills | Status: DC | PRN

## 2020-06-11 NOTE — Progress Notes (Signed)
[facility] [place], [place] [address]  Internal MEDICINE  Office Visit Note  Patient Name: Yvette Humphrey  [id number]  [id number]  Date of Service: 06/11/2020   Pt is here for routine health maintenance examination  Chief Complaint  Patient presents with  . Annual Exam  . Hypertension  . Quality Metric Gaps    flu,tetnaus  . controlled substance form    reviewed with PT     The patient is here for health maintenance exam. She states that she started seeing a therapist this week. Will be seeing the therapist every other week to start. Is having some increased depression. She states that she is working from home and both of her daughter's are now in college. She is home alone a lot. She states that she cries frequently. Therapist told her that she needed to talk to her provider about this today. She states that the medication she is currently on is helping the anxiety for the most part. She states that she is doing well with trazodone. She did bump it to two tablets at bedtime and finds that this works very well.  She is having some pain, tingling, and numbness in her left elbow which radiates up the arm and into the fourth and fifth fingers of the hand. Did have surgery to replace the ulnar nerve of the left arm about four years ago. Did help for some time. At one point, she took low dose of gabapentin to help this at night. She does not wish to go back to orthopedics at this point.     Current Medication: Outpatient Encounter Medications as of 06/11/2020  Medication Sig  . ALPRAZolam (XANAX) 0.5 MG tablet Take 1 tablet (0.5 mg total) by mouth daily as needed for anxiety.  . Butalbital-APAP-Caffeine (FIORICET) 50-300-40 MG CAPS Take 1 capsule by mouth as needed. migraines  . cetirizine (ZYRTEC) 10 MG tablet Take 10 mg by mouth daily.  . chlorpheniramine-HYDROcodone (TUSSIONEX PENNKINETIC ER) 10-8 MG/5ML SUER Take 5 mLs by mouth at bedtime as needed for  cough.  . cyanocobalamin 500 MCG tablet Take 500 mcg by mouth daily.  [place] Kitchen dicyclomine (BENTYL) 10 MG capsule Take 1 to 2 capsules po TID prn cramping  . fluconazole (DIFLUCAN) 150 MG tablet Take 1 tab po once  Repeat again if symptoms persist  . fluticasone (FLOVENT HFA) 110 MCG/ACT inhaler Take 2 puffs twice aday for reactive airway disease  . Lysine 1000 MG TABS Take by mouth daily as needed.  . Magnesium 250 MG TABS Take by mouth daily.  . meloxicam (MOBIC) 15 MG tablet Take 1 tablet (15 mg total) by mouth daily.  . metoprolol succinate (TOPROL-XL) 50 MG 24 hr tablet Take 1 tablet (50 mg total) by mouth daily. Take with or immediately following a meal.  . montelukast (SINGULAIR) 10 MG tablet Take 1 tablet (10 mg total) by mouth at bedtime.  [place] Kitchen POTASSIUM CHLORIDE PO Take 10 mcg by mouth daily.  . sertraline (ZOLOFT) 100 MG tablet TAKE 1 TABLET(100 MG) BY MOUTH DAILY  . traZODone (DESYREL) 100 MG tablet Take 1 tablet (100 mg total) by mouth at bedtime as needed for sleep.  . [DISCONTINUED] traZODone (DESYREL) 50 MG tablet Take 0.5-1 tablets (25-50 mg total) by mouth at bedtime as needed for sleep.  [place] Kitchen buPROPion (WELLBUTRIN) 75 MG tablet Take 1 tablet (75 mg total) by mouth 2 (two) times daily.  [place] Kitchen gabapentin (NEURONTIN) 100 MG capsule Take 1 to 2 capsules  po QHS for neuropathy   No facility-administered encounter medications on file as of 06/11/2020.    Surgical History: Past Surgical History:  Procedure Laterality Date  . CHOLECYSTECTOMY    . ENDOMETRIAL ABLATION     approx 2009  . ESOPHAGOGASTRODUODENOSCOPY    . REDUCTION MAMMAPLASTY Bilateral 2004  . TONSILLECTOMY    . TUBAL LIGATION    . ULNAR NERVE TRANSPOSITION Left 06/08/2016   Procedure: SUBCUTANEOUS ANTERIOR ULNAR NERVE TRANSPOSITION LEFT ELBOW;  Surgeon: Corky Mull, MD;  Location: [place];  Service: Orthopedics;  Laterality: Left;    Medical History: Past Medical History:  Diagnosis Date  . Complication of  anesthesia   . Headache    migraines, 1x/mo  . Hypertension   . Motion sickness    back seat cars  . PONV (postoperative nausea and vomiting)   . Tachycardia   . Wears contact lenses     Family History: Family History  Problem Relation Age of Onset  . Breast cancer Paternal Grandmother       Review of Systems  Constitutional: Negative for activity change, chills, fatigue and unexpected weight change.  HENT: Negative for congestion, postnasal drip, rhinorrhea, sneezing and sore throat.   Respiratory: Negative for cough, chest tightness, shortness of breath and wheezing.   Cardiovascular: Negative for chest pain and palpitations.  Gastrointestinal: Negative for abdominal pain, constipation, diarrhea, nausea and vomiting.  Endocrine: Negative for cold intolerance, heat intolerance, polydipsia and polyuria.  Genitourinary: Negative for dysuria, frequency and urgency.  Musculoskeletal: Negative for arthralgias, back pain, joint swelling and neck pain.  Skin: Negative for rash.  Allergic/Immunologic: Positive for environmental allergies.  Neurological: Negative for dizziness, tremors, numbness and headaches.  Hematological: Negative for adenopathy. Does not bruise/bleed easily.  Psychiatric/Behavioral: Positive for dysphoric mood. Negative for behavioral problems (Depression), sleep disturbance and suicidal ideas. The patient is nervous/anxious.      Today's Vitals   06/11/20 0929  BP: 129/90  Pulse: 88  Resp: 16  Temp: 97.7 F (36.5 C)  SpO2: 98%  Weight: 187 lb 6.4 oz (85 kg)  Height: 5' 3"  (1.6 m)   Body mass index is 33.2 kg/m.  Physical Exam Vitals and nursing note reviewed.  Constitutional:      General: She is not in acute distress.    Appearance: Normal appearance. She is well-developed. She is not diaphoretic.  HENT:     Head: Normocephalic and atraumatic.     Nose: Nose normal.     Mouth/Throat:     Pharynx: No oropharyngeal exudate.  Eyes:     Pupils:  Pupils are equal, round, and reactive to light.  Neck:     Thyroid: No thyromegaly.     Vascular: No JVD.     Trachea: No tracheal deviation.  Cardiovascular:     Rate and Rhythm: Normal rate and regular rhythm.     Pulses: Normal pulses.     Heart sounds: Normal heart sounds. No murmur heard.  No friction rub. No gallop.   Pulmonary:     Effort: Pulmonary effort is normal. No respiratory distress.     Breath sounds: Normal breath sounds. No wheezing or rales.  Chest:     Chest wall: No tenderness.  Abdominal:     General: Bowel sounds are normal.     Palpations: Abdomen is soft.     Tenderness: There is no abdominal tenderness.  Musculoskeletal:        General: Normal range of motion.  Cervical back: Normal range of motion and neck supple.  Lymphadenopathy:     Cervical: No cervical adenopathy.  Skin:    General: Skin is warm and dry.  Neurological:     General: No focal deficit present.     Mental Status: She is alert and oriented to person, place, and time.     Cranial Nerves: No cranial nerve deficit.  Psychiatric:        Mood and Affect: Mood normal.        Behavior: Behavior normal.        Thought Content: Thought content normal.        Judgment: Judgment normal.    Assessment/Plan: 1. Encounter for general adult medical examination with abnormal findings Annual health maintenance exam today.   2. Essential (primary) hypertension Stable. contineu bp medication as prescribed   3. Entrapment of left ulnar nerve Start gabapentin 121m at bedtime. Start with one capsule at bedtime. May increase to two capsules at bedtime as needed and as tolerated.  - gabapentin (NEURONTIN) 100 MG capsule; Take 1 to 2 capsules po QHS for neuropathy  Dispense: 90 capsule; Refill: 3  4. Major depressive disorder, recurrent, moderate (HCC) Start Wellbutrin 745mevery day. May increase to twice daily as needed and tolerated. Continue sertraline as prescribed. May continue alprazolam  as previously prescribed . - buPROPion (WELLBUTRIN) 75 MG tablet; Take 1 tablet (75 mg total) by mouth 2 (two) times daily.  Dispense: 60 tablet; Refill: 1  5. Primary insomnia Increase trazodone to 10076mt bedtime as needed for insomnia.  - traZODone (DESYREL) 100 MG tablet; Take 1 tablet (100 mg total) by mouth at bedtime as needed for sleep.  Dispense: 30 tablet; Refill: 2  General Counseling: Sushokulani rogelderstanding of the findings of todays visit and agrees with plan of treatment. I have discussed any further diagnostic evaluation that may be needed or ordered today. We also reviewed her medications today. she has been encouraged to call the office with any questions or concerns that should arise related to todays visit.    Counseling:   This patient was seen by HeaPretty Prairieth Dr FozLavera Guise a part of collaborative care agreement  Meds ordered this encounter  Medications  . buPROPion (WELLBUTRIN) 75 MG tablet    Sig: Take 1 tablet (75 mg total) by mouth 2 (two) times daily.    Dispense:  60 tablet    Refill:  1    Order Specific Question:   Supervising Provider    Answer:   KHALavera Guise4[[id number]] traZODone (DESYREL) 100 MG tablet    Sig: Take 1 tablet (100 mg total) by mouth at bedtime as needed for sleep.    Dispense:  30 tablet    Refill:  2    Please note increased dose    Order Specific Question:   Supervising Provider    Answer:   KHALavera Guise4[[id number]] gabapentin (NEURONTIN) 100 MG capsule    Sig: Take 1 to 2 capsules po QHS for neuropathy    Dispense:  90 capsule    Refill:  3    Order Specific Question:   Supervising Provider    Answer:   KHALavera Guise4[[id number]] Total time spent: 45 38nutes  Time spent includes review of chart, medications, test results, and follow up plan with the patient.     FozLavera GuiseD  Internal Medicine

## 2020-06-19 ENCOUNTER — Telehealth: Payer: Self-pay

## 2020-06-23 ENCOUNTER — Other Ambulatory Visit: Payer: Self-pay | Admitting: Nurse Practitioner

## 2020-06-23 DIAGNOSIS — F411 Generalized anxiety disorder: Secondary | ICD-10-CM

## 2020-06-23 MED ORDER — ALPRAZOLAM 0.5 MG PO TABS: 0.5000 mg | ORAL_TABLET | Freq: Every day | ORAL | 2 refills | Status: DC | PRN

## 2020-06-23 NOTE — Telephone Encounter (Signed)
Sorry. Just took care of this and sent new prescription to her pharmacy.

## 2020-06-24 NOTE — Telephone Encounter (Signed)
Lmom we send med

## 2020-07-13 ENCOUNTER — Other Ambulatory Visit: Payer: Self-pay

## 2020-07-13 ENCOUNTER — Encounter: Payer: Self-pay | Admitting: Nurse Practitioner

## 2020-07-13 ENCOUNTER — Ambulatory Visit: Payer: BC Managed Care – PPO | Admitting: Nurse Practitioner

## 2020-07-13 VITALS — BP 114/80 | HR 87 | Temp 98.2°F | Resp 16 | Ht 63.0 in | Wt 193.2 lb

## 2020-07-13 DIAGNOSIS — G5622 Lesion of ulnar nerve, left upper limb: Secondary | ICD-10-CM | POA: Diagnosis not present

## 2020-07-13 DIAGNOSIS — F411 Generalized anxiety disorder: Secondary | ICD-10-CM | POA: Diagnosis not present

## 2020-07-13 DIAGNOSIS — F5101 Primary insomnia: Secondary | ICD-10-CM | POA: Diagnosis not present

## 2020-07-13 DIAGNOSIS — I10 Essential (primary) hypertension: Secondary | ICD-10-CM

## 2020-07-13 MED ORDER — GABAPENTIN 100 MG PO CAPS: ORAL_CAPSULE | ORAL | 3 refills | Status: DC

## 2020-07-13 NOTE — Progress Notes (Signed)
[facility] [place], [place] [address]  Internal MEDICINE  Office Visit Note  Patient Name: Yvette Humphrey  [id number]  [id number]  Date of Service: 08/12/2020  Chief Complaint  Patient presents with  . Follow-up  . Hypertension  . controlled substance form    reviewed with PT    The patient is here for follow up visit. She states that she has been doing well with current medication therapy. Started gabapentin as she has been having pain, tingling, and numbness in her left elbow which radiates up the arm and into the fourth and fifth fingers of the hand. Did have surgery to replace the ulnar nerve of the left arm about four years ago. Did help for some time. At one point, she took low dose of gabapentin to help this at night. Restarted gabapentin at her most recent visit. She is currently taking this at night only. Finds that is when the arm and hand bother her the most. Needs to figure out how to take this during the day as well to get relief without negative side effects. States that she can't take higher doses during the day as it does make her sleepy.  Started wellbutrin 62m due to increased depression and anxiety. Is tolerating this well. Only taking this once daily. Still sleeping well. States that she is not crying as often and feels less irritable.        Current Medication: Outpatient Encounter Medications as of 07/13/2020  Medication Sig  . ALPRAZolam (XANAX) 0.5 MG tablet Take 1 tablet (0.5 mg total) by mouth daily as needed for anxiety.  .[place] KitchenbuPROPion (WELLBUTRIN) 75 MG tablet Take 1 tablet (75 mg total) by mouth 2 (two) times daily.  . Butalbital-APAP-Caffeine (FIORICET) 50-300-40 MG CAPS Take 1 capsule by mouth as needed. migraines  . cetirizine (ZYRTEC) 10 MG tablet Take 10 mg by mouth daily.  . chlorpheniramine-HYDROcodone (TUSSIONEX PENNKINETIC ER) 10-8 MG/5ML SUER Take 5 mLs by mouth at bedtime as needed for cough.  . cyanocobalamin 500 MCG  tablet Take 500 mcg by mouth daily.  .Mar[place] Kitchendicyclomine (BENTYL) 10 MG capsule Take 1 to 2 capsules po TID prn cramping  . fluconazole (DIFLUCAN) 150 MG tablet Take 1 tab po once  Repeat again if symptoms persist  . fluticasone (FLOVENT HFA) 110 MCG/ACT inhaler Take 2 puffs twice aday for reactive airway disease  . gabapentin (NEURONTIN) 100 MG capsule Take 2 capsules po QAM and 2 capsules po QPM  . Lysine 1000 MG TABS Take by mouth daily as needed.  . Magnesium 250 MG TABS Take by mouth daily.  . meloxicam (MOBIC) 15 MG tablet Take 1 tablet (15 mg total) by mouth daily.  . metoprolol succinate (TOPROL-XL) 50 MG 24 hr tablet Take 1 tablet (50 mg total) by mouth daily. Take with or immediately following a meal.  . montelukast (SINGULAIR) 10 MG tablet Take 1 tablet (10 mg total) by mouth at bedtime.  .[place] KitchenPOTASSIUM CHLORIDE PO Take 10 mcg by mouth daily.  . sertraline (ZOLOFT) 100 MG tablet TAKE 1 TABLET(100 MG) BY MOUTH DAILY  . traZODone (DESYREL) 100 MG tablet Take 1 tablet (100 mg total) by mouth at bedtime as needed for sleep.  . [DISCONTINUED] gabapentin (NEURONTIN) 100 MG capsule Take 1 to 2 capsules po QHS for neuropathy   No facility-administered encounter medications on file as of 07/13/2020.    Surgical History: Past Surgical History:  Procedure Laterality Date  . CHOLECYSTECTOMY    .  ENDOMETRIAL ABLATION     approx 2009  . ESOPHAGOGASTRODUODENOSCOPY    . REDUCTION MAMMAPLASTY Bilateral 2004  . TONSILLECTOMY    . TUBAL LIGATION    . ULNAR NERVE TRANSPOSITION Left 06/08/2016   Procedure: SUBCUTANEOUS ANTERIOR ULNAR NERVE TRANSPOSITION LEFT ELBOW;  Surgeon: Corky Mull, MD;  Location: [place];  Service: Orthopedics;  Laterality: Left;    Medical History: Past Medical History:  Diagnosis Date  . Complication of anesthesia   . Headache    migraines, 1x/mo  . Hypertension   . Motion sickness    back seat cars  . PONV (postoperative nausea and vomiting)   . Tachycardia    . Wears contact lenses     Family History: Family History  Problem Relation Age of Onset  . Breast cancer Paternal Grandmother     Social History   Socioeconomic History  . Marital status: Married    Spouse name: Not on file  . Number of children: Not on file  . Years of education: Not on file  . Highest education level: Not on file  Occupational History  . Not on file  Tobacco Use  . Smoking status: Never Smoker  . Smokeless tobacco: Never Used  Vaping Use  . Vaping Use: Never used  Substance and Sexual Activity  . Alcohol use: Yes    Alcohol/week: 1.0 standard drink    Types: 1 Glasses of wine per week    Comment: occasionally  . Drug use: No  . Sexual activity: Not on file  Other Topics Concern  . Not on file  Social History Narrative  . Not on file   Social Determinants of Health   Financial Resource Strain: Not on file  Food Insecurity: Not on file  Transportation Needs: Not on file  Physical Activity: Not on file  Stress: Not on file  Social Connections: Not on file  Intimate Partner Violence: Not on file      Review of Systems  Constitutional: Negative for activity change, chills, fatigue and unexpected weight change.  HENT: Negative for congestion, postnasal drip, rhinorrhea, sneezing and sore throat.   Respiratory: Negative for cough, chest tightness, shortness of breath and wheezing.   Cardiovascular: Negative for chest pain and palpitations.  Gastrointestinal: Negative for abdominal pain, constipation, diarrhea, nausea and vomiting.  Endocrine: Negative for cold intolerance, heat intolerance, polydipsia and polyuria.  Genitourinary: Negative for dysuria, frequency and urgency.  Musculoskeletal: Negative for arthralgias, back pain, joint swelling and neck pain.  Skin: Negative for rash.  Allergic/Immunologic: Positive for environmental allergies.  Neurological: Negative for dizziness, tremors, numbness and headaches.  Hematological: Negative  for adenopathy. Does not bruise/bleed easily.  Psychiatric/Behavioral: Positive for dysphoric mood. Negative for behavioral problems (Depression), sleep disturbance and suicidal ideas. The patient is nervous/anxious.     Today's Vitals   07/13/20 0906  BP: 114/80  Pulse: 87  Resp: 16  Temp: 98.2 F (36.8 C)  SpO2: 97%  Weight: 193 lb 3.2 oz (87.6 kg)  Height: 5' 3"  (1.6 m)   Body mass index is 34.22 kg/m.  Physical Exam Vitals and nursing note reviewed.  Constitutional:      General: She is not in acute distress.    Appearance: Normal appearance. She is well-developed and well-nourished. She is not diaphoretic.  HENT:     Head: Normocephalic and atraumatic.     Mouth/Throat:     Mouth: Oropharynx is clear and moist.     Pharynx: No oropharyngeal exudate.  Eyes:  Extraocular Movements: EOM normal.     Pupils: Pupils are equal, round, and reactive to light.  Neck:     Thyroid: No thyromegaly.     Vascular: No JVD.     Trachea: No tracheal deviation.  Cardiovascular:     Rate and Rhythm: Normal rate and regular rhythm.     Heart sounds: Normal heart sounds. No murmur heard. No friction rub. No gallop.   Pulmonary:     Effort: Pulmonary effort is normal. No respiratory distress.     Breath sounds: Normal breath sounds. No wheezing or rales.  Chest:     Chest wall: No tenderness.  Abdominal:     Palpations: Abdomen is soft.  Musculoskeletal:        General: Normal range of motion.     Cervical back: Normal range of motion and neck supple.  Lymphadenopathy:     Cervical: No cervical adenopathy.  Skin:    General: Skin is warm and dry.  Neurological:     Mental Status: She is alert and oriented to person, place, and time.     Cranial Nerves: No cranial nerve deficit.  Psychiatric:        Mood and Affect: Mood and affect normal.        Behavior: Behavior normal.        Thought Content: Thought content normal.        Judgment: Judgment normal.     Comments:  Improved mood and irritability.    Assessment/Plan: 1. Essential (primary) hypertension Well managed. Continue bp medication as prescribed   2. Entrapment of left ulnar nerve Increase dosing of gabapentin slowly and as tolerated. May increase up to two capsules in the morning an two capsules in the evening. Consider referral to orthopedics of symptoms persist or worsen.  - gabapentin (NEURONTIN) 100 MG capsule; Take 2 capsules po QAM and 2 capsules po QPM  Dispense: 120 capsule; Refill: 3  3. GAD (generalized anxiety disorder) Improved. Continue sertraline with added wellbutrin.  4. Primary insomnia Improved. May take trazodone at night as needed to help insomnia.    General Counseling: jilliann subramanian understanding of the findings of todays visit and agrees with plan of treatment. I have discussed any further diagnostic evaluation that may be needed or ordered today. We also reviewed her medications today. she has been encouraged to call the office with any questions or concerns that should arise related to todays visit.   This patient was seen by [place] with Dr Lavera Guise as a part of collaborative care agreement  Meds ordered this encounter  Medications  . gabapentin (NEURONTIN) 100 MG capsule    Sig: Take 2 capsules po QAM and 2 capsules po QPM    Dispense:  120 capsule    Refill:  3    Order Specific Question:   Supervising Provider    Answer:   Lavera Guise [[id number]]    Total time spent: 25 Minutes   Time spent includes review of chart, medications, test results, and follow up plan with the patient.      Dr Lavera Guise Internal medicine

## 2020-08-27 ENCOUNTER — Ambulatory Visit (INDEPENDENT_AMBULATORY_CARE_PROVIDER_SITE_OTHER): Payer: BC Managed Care – PPO | Admitting: Internal Medicine

## 2020-08-27 ENCOUNTER — Encounter: Payer: Self-pay | Admitting: Physician Assistant

## 2020-08-27 VITALS — Temp 99.0°F | Ht 63.0 in | Wt 178.0 lb

## 2020-08-27 DIAGNOSIS — J45909 Unspecified asthma, uncomplicated: Secondary | ICD-10-CM | POA: Diagnosis not present

## 2020-08-27 DIAGNOSIS — J300 Vasomotor rhinitis: Secondary | ICD-10-CM

## 2020-08-27 MED ORDER — FLUTICASONE PROPIONATE 50 MCG/ACT NA SUSP: 2.0000 | Freq: Every day | NASAL | 6 refills | Status: DC

## 2020-08-27 MED ORDER — DOXYCYCLINE HYCLATE 100 MG PO TABS: 100.0000 mg | ORAL_TABLET | Freq: Two times a day (BID) | ORAL | 0 refills | Status: DC

## 2020-08-27 NOTE — Progress Notes (Signed)
[facility] [place], [place] [address]  Internal MEDICINE  Telephone Visit  Patient Name: Yvette Humphrey  [id number]  [id number]  Date of Service: 09/01/2020  I connected with the patient at 1050 am by telephone and verified the patients identity using two identifiers.   I discussed the limitations, risks, security and privacy concerns of performing an evaluation and management service by telephone and the availability of in person appointments. I also discussed with the patient that there may be a patient responsible charge related to the service.  The patient expressed understanding and agrees to proceed.    Chief Complaint  Patient presents with  . Telephone Assessment    Video-[phone]  . Telephone Screen  . Nasal Congestion  . Sinusitis    Exposure to covid   . Headache  . Cough    Yellowish mucus     HPI Pt is connected for acute and sick visit, stuffy nose and headache, she is coughing which is productive.  Nor smoking history, Covid exposure is possible, no chest pain, slight sob   Current Medication: Outpatient Encounter Medications as of 08/27/2020  Medication Sig  . ALPRAZolam (XANAX) 0.5 MG tablet Take 1 tablet (0.5 mg total) by mouth daily as needed for anxiety.  Marland Kitchen buPROPion (WELLBUTRIN) 75 MG tablet Take 1 tablet (75 mg total) by mouth 2 (two) times daily.  . Butalbital-APAP-Caffeine (FIORICET) 50-300-40 MG CAPS Take 1 capsule by mouth as needed. migraines  . cetirizine (ZYRTEC) 10 MG tablet Take 10 mg by mouth daily.  . chlorpheniramine-HYDROcodone (TUSSIONEX PENNKINETIC ER) 10-8 MG/5ML SUER Take 5 mLs by mouth at bedtime as needed for cough.  . cyanocobalamin 500 MCG tablet Take 500 mcg by mouth daily.  Marland Kitchen dicyclomine (BENTYL) 10 MG capsule Take 1 to 2 capsules po TID prn cramping  . doxycycline (VIBRA-TABS) 100 MG tablet Take 1 tablet (100 mg total) by mouth 2 (two) times daily.  . fluconazole (DIFLUCAN) 150 MG tablet Take 1 tab po  once  Repeat again if symptoms persist  . fluticasone (FLONASE) 50 MCG/ACT nasal spray Place 2 sprays into both nostrils daily.  . fluticasone (FLOVENT HFA) 110 MCG/ACT inhaler Take 2 puffs twice aday for reactive airway disease  . gabapentin (NEURONTIN) 100 MG capsule Take 2 capsules po QAM and 2 capsules po QPM  . Lysine 1000 MG TABS Take by mouth daily as needed.  . Magnesium 250 MG TABS Take by mouth daily.  . meloxicam (MOBIC) 15 MG tablet Take 1 tablet (15 mg total) by mouth daily.  . metoprolol succinate (TOPROL-XL) 50 MG 24 hr tablet Take 1 tablet (50 mg total) by mouth daily. Take with or immediately following a meal.  . montelukast (SINGULAIR) 10 MG tablet Take 1 tablet (10 mg total) by mouth at bedtime.  Marland Kitchen POTASSIUM CHLORIDE PO Take 10 mcg by mouth daily.  . sertraline (ZOLOFT) 100 MG tablet TAKE 1 TABLET(100 MG) BY MOUTH DAILY  . traZODone (DESYREL) 100 MG tablet Take 1 tablet (100 mg total) by mouth at bedtime as needed for sleep.   No facility-administered encounter medications on file as of 08/27/2020.    Surgical History: Past Surgical History:  Procedure Laterality Date  . CHOLECYSTECTOMY    . ENDOMETRIAL ABLATION     approx 2009  . ESOPHAGOGASTRODUODENOSCOPY    . REDUCTION MAMMAPLASTY Bilateral 2004  . TONSILLECTOMY    . TUBAL LIGATION    . ULNAR NERVE TRANSPOSITION Left 06/08/2016   Procedure: SUBCUTANEOUS ANTERIOR  ULNAR NERVE TRANSPOSITION LEFT ELBOW;  Surgeon: Corky Mull, MD;  Location: [facility];  Service: Orthopedics;  Laterality: Left;    Medical History: Past Medical History:  Diagnosis Date  . Complication of anesthesia   . Headache    migraines, 1x/mo  . Hypertension   . Motion sickness    back seat cars  . PONV (postoperative nausea and vomiting)   . Tachycardia   . Wears contact lenses     Family History: Family History  Problem Relation Age of Onset  . Breast cancer Paternal Grandmother     Social History   Socioeconomic  History  . Marital status: Married    Spouse name: Not on file  . Number of children: Not on file  . Years of education: Not on file  . Highest education level: Not on file  Occupational History  . Not on file  Tobacco Use  . Smoking status: Never Smoker  . Smokeless tobacco: Never Used  Vaping Use  . Vaping Use: Never used  Substance and Sexual Activity  . Alcohol use: Yes    Alcohol/week: 1.0 standard drink    Types: 1 Glasses of wine per week    Comment: occasionally  . Drug use: No  . Sexual activity: Not on file  Other Topics Concern  . Not on file  Social History Narrative  . Not on file   Social Determinants of Health   Financial Resource Strain: Not on file  Food Insecurity: Not on file  Transportation Needs: Not on file  Physical Activity: Not on file  Stress: Not on file  Social Connections: Not on file  Intimate Partner Violence: Not on file      Review of Systems  Constitutional: Negative for fatigue and fever.  HENT: Positive for postnasal drip. Negative for congestion and mouth sores.   Respiratory: Positive for cough.   Cardiovascular: Negative for chest pain.  Genitourinary: Negative for flank pain.  Psychiatric/Behavioral: Negative.     Vital Signs: Temp 99 F (37.2 C)   Ht 5' 3"  (1.6 m)   Wt 178 lb (80.7 kg)   BMI 31.53 kg/m    Observation/Objective: NAD, but congested     Assessment/Plan: 1. Acute asthmatic bronchitis Pt is to rest and increase fluids.  - doxycycline (VIBRA-TABS) 100 MG tablet; Take 1 tablet (100 mg total) by mouth 2 (two) times daily.  Dispense: 20 tablet; Refill: 0  2. Vasomotor rhinitis - fluticasone (FLONASE) 50 MCG/ACT nasal spray; Place 2 sprays into both nostrils daily.  Dispense: 16 g; Refill: 6  General Counseling: Yvette Humphrey understanding of the findings of today's phone visit and agrees with plan of treatment. I have discussed any further diagnostic evaluation that may be needed or ordered today.  We also reviewed her medications today. she has been encouraged to call the office with any questions or concerns that should arise related to todays visit.   Meds ordered this encounter  Medications  . doxycycline (VIBRA-TABS) 100 MG tablet    Sig: Take 1 tablet (100 mg total) by mouth 2 (two) times daily.    Dispense:  20 tablet    Refill:  0  . fluticasone (FLONASE) 50 MCG/ACT nasal spray    Sig: Place 2 sprays into both nostrils daily.    Dispense:  16 g    Refill:  6    Time spent:15 Minutes  Dr Lavera Guise Internal medicine

## 2020-09-11 ENCOUNTER — Other Ambulatory Visit: Payer: Self-pay

## 2020-09-11 ENCOUNTER — Other Ambulatory Visit: Payer: Self-pay | Admitting: Internal Medicine

## 2020-09-11 DIAGNOSIS — F411 Generalized anxiety disorder: Secondary | ICD-10-CM

## 2020-09-11 DIAGNOSIS — M064 Inflammatory polyarthropathy: Secondary | ICD-10-CM

## 2020-09-11 DIAGNOSIS — F5101 Primary insomnia: Secondary | ICD-10-CM

## 2020-09-11 MED ORDER — TRAZODONE HCL 100 MG PO TABS: 100.0000 mg | ORAL_TABLET | Freq: Every evening | ORAL | 2 refills | Status: DC | PRN

## 2020-09-11 MED ORDER — MELOXICAM 15 MG PO TABS: 15.0000 mg | ORAL_TABLET | Freq: Every day | ORAL | 5 refills | Status: DC

## 2020-09-28 ENCOUNTER — Ambulatory Visit
Admission: RE | Admit: 2020-09-28 | Discharge: 2020-09-28 | Disposition: A | Discharge status: Home / Self Care | Payer: BC Managed Care – PPO | Source: Ambulatory Visit | Attending: Hospice and Palliative Medicine | Admitting: Hospice and Palliative Medicine

## 2020-09-28 ENCOUNTER — Other Ambulatory Visit: Payer: Self-pay

## 2020-09-28 ENCOUNTER — Encounter: Payer: Self-pay | Admitting: Hospice and Palliative Medicine

## 2020-09-28 ENCOUNTER — Ambulatory Visit: Payer: BC Managed Care – PPO | Admitting: Hospice and Palliative Medicine

## 2020-09-28 VITALS — BP 118/70 | HR 81 | Temp 97.9°F | Resp 16 | Ht 63.0 in | Wt 197.0 lb

## 2020-09-28 DIAGNOSIS — M25552 Pain in left hip: Secondary | ICD-10-CM

## 2020-09-28 DIAGNOSIS — R200 Anesthesia of skin: Secondary | ICD-10-CM

## 2020-09-28 DIAGNOSIS — M064 Inflammatory polyarthropathy: Secondary | ICD-10-CM

## 2020-09-28 DIAGNOSIS — F331 Major depressive disorder, recurrent, moderate: Secondary | ICD-10-CM

## 2020-09-28 DIAGNOSIS — I10 Essential (primary) hypertension: Secondary | ICD-10-CM

## 2020-09-28 MED ORDER — METOPROLOL SUCCINATE ER 50 MG PO TB24: 50.0000 mg | ORAL_TABLET | Freq: Every day | ORAL | 3 refills | Status: DC

## 2020-09-28 MED ORDER — BUPROPION HCL 75 MG PO TABS: 75.0000 mg | ORAL_TABLET | Freq: Two times a day (BID) | ORAL | 1 refills | Status: DC

## 2020-09-28 NOTE — Progress Notes (Signed)
Please call and let her know that hip xray is normal. She may continue with Meloxicam and Gabapentin for now, awaiting blood test results. Has office visit coming up for follow-up if pain worsens or if she develops further symptoms advise her to contact the office.

## 2020-09-28 NOTE — Progress Notes (Signed)
[facility] [place], Yvette Humph[place] [address]  Internal MEDICINE  Office Visit Note  Patient Name: Yvette Humphrey  [id number]  [id number]  Date of Service: 09/28/2020  Chief Complaint  Patient presents with  . Acute Visit    Hip and toe pain, numbness  going on for few weeks   . Hypertension     HPI Pt is here for a sick visit. 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 ALPRAZolam (XANAX) 0.5 MG tablet Take 1 tablet (0.5 mg total) by mouth daily as needed for anxiety.  . Butalbital-APAP-Caffeine (FIORICET) 50-300-40 MG CAPS Take 1 capsule by mouth as needed. migraines  . cetirizine (ZYRTEC) 10 MG tablet Take 10 mg by mouth daily.  . cyanocobalamin 500 MCG tablet Take 500 mcg by mouth daily.  Marland Kitchen dicyclomine (BENTYL) 10 MG capsule Take 1 to 2 capsules po TID prn cramping  . fluconazole (DIFLUCAN) 150 MG tablet Take 1 tab po once  Repeat again if symptoms persist  . fluticasone (FLONASE) 50 MCG/ACT nasal spray Place 2 sprays into both nostrils daily.  . fluticasone (FLOVENT HFA) 110 MCG/ACT inhaler Take 2 puffs twice aday for reactive airway disease  . gabapentin (NEURONTIN) 100 MG capsule Take 2 capsules po QAM and 2 capsules po QPM  . Lysine 1000 MG TABS Take by mouth daily as needed.  . Magnesium 250 MG TABS Take by mouth daily.  .  meloxicam (MOBIC) 15 MG tablet Take 1 tablet (15 mg total) by mouth daily.  . montelukast (SINGULAIR) 10 MG tablet Take 1 tablet (10 mg total) by mouth at bedtime.  Marland Kitchen POTASSIUM CHLORIDE PO Take 10 mcg by mouth daily.  . sertraline (ZOLOFT) 100 MG tablet TAKE 1 TABLET(100 MG) BY MOUTH DAILY  . traZODone (DESYREL) 100 MG tablet Take 1 tablet (100 mg total) by mouth at bedtime as needed for sleep.  . [DISCONTINUED] buPROPion (WELLBUTRIN) 75 MG tablet Take 1 tablet (75 mg total) by mouth 2 (two) times daily.  . [DISCONTINUED] chlorpheniramine-HYDROcodone (TUSSIONEX PENNKINETIC ER) 10-8 MG/5ML SUER Take 5 mLs by mouth at bedtime as needed for cough.  . [DISCONTINUED] doxycycline (VIBRA-TABS) 100 MG tablet Take 1 tablet (100 mg total) by mouth 2 (two) times daily.  . [DISCONTINUED] metoprolol succinate (TOPROL-XL) 50 MG 24 hr tablet Take 1 tablet (50 mg total) by mouth daily. Take with or immediately following a meal.   No facility-administered encounter medications on file as of 09/28/2020.      Medical History: Past Medical History:  Diagnosis Date  . Complication of anesthesia   . Headache    migraines, 1x/mo  . Hypertension   . Motion sickness    back seat cars  . PONV (postoperative nausea and vomiting)   . Tachycardia   . Wears contact lenses      Vital Signs: BP 118/70   Pulse 81   Temp 97.9 F (36.6 C)   Resp  16   Ht 5' 3" (1.6 m)   Wt 197 lb (89.4 kg)   SpO2 96%   BMI 34.90 kg/m    Review of Systems  Constitutional: Negative for chills, diaphoresis and fatigue.  HENT: Negative for ear pain, postnasal drip and sinus pressure.   Eyes: Negative for photophobia, discharge, redness, itching and visual disturbance.  Respiratory: Negative for cough, shortness of breath and wheezing.   Cardiovascular: Negative for chest pain, palpitations and leg swelling.  Gastrointestinal: Negative for abdominal pain, constipation, diarrhea, nausea and vomiting.  Genitourinary: Negative  for dysuria and flank pain.  Musculoskeletal: Positive for arthralgias. Negative for back pain, gait problem and neck pain.       Left hip pain  Skin: Negative for color change.  Allergic/Immunologic: Negative for environmental allergies and food allergies.  Neurological: Positive for numbness. Negative for dizziness and headaches.       Left great toe numbness  Hematological: Does not bruise/bleed easily.  Psychiatric/Behavioral: Negative for agitation, behavioral problems (depression) and hallucinations.    Physical Exam Vitals reviewed.  Constitutional:      Appearance: Normal appearance. She is normal weight.  Cardiovascular:     Rate and Rhythm: Normal rate and regular rhythm.     Pulses: Normal pulses.     Heart sounds: Normal heart sounds.  Pulmonary:     Effort: Pulmonary effort is normal.     Breath sounds: Normal breath sounds.  Abdominal:     General: Abdomen is flat.     Palpations: Abdomen is soft.  Musculoskeletal:     Cervical back: Normal range of motion.     Left hip: Decreased range of motion.     Comments: Obvious discomfort with positional changes and ambulation  Skin:    General: Skin is warm.  Neurological:     General: No focal deficit present.     Mental Status: She is alert and oriented to person, place, and time. Mental status is at baseline.  Psychiatric:        Mood and Affect: Mood normal.        Behavior: Behavior normal.        Thought Content: Thought content normal.        Judgment: Judgment normal.     Assessment/Plan: 1. Acute hip pain, left Will review hip xray Consider lumbar xray with negative if not acute abnormalities seen on hip films - DG HIP UNILAT WITH PELVIS 2-3 VIEWS LEFT; Future  2. Numbness of lower extremity Continue with prescribed gabapentin at this time  3. Inflammatory polyarthropathy of multiple sites ([place]) R/o RA, PMR - Rheumatoid Factor - ANA - Sed Rate (ESR)  General Counseling: Yvette Humphrey  understanding of the findings of todays visit and agrees with plan of treatment. I have discussed any further diagnostic evaluation that may be needed or ordered today. We also reviewed her medications today. she has been encouraged to call the office with any questions or concerns that should arise related to todays visit.   Orders Placed This Encounter  Procedures  . DG HIP UNILAT WITH PELVIS 2-3 VIEWS LEFT  . Rheumatoid Factor  . ANA  . Sed Rate (ESR)    Time spent: 25 Minutes  This patient was seen by Theodoro Grist AGNP-C in Collaboration with Dr Lavera Guise as a part of collaborative care agreement.  Tanna Furry [facility]

## 2020-09-29 ENCOUNTER — Other Ambulatory Visit: Payer: Self-pay | Admitting: Internal Medicine

## 2020-09-29 ENCOUNTER — Telehealth: Payer: Self-pay

## 2020-09-29 NOTE — Telephone Encounter (Signed)
-----   Message from Luiz Ochoa, NP sent at 09/28/2020  5:46 PM EST ----- Please call and let her know that hip xray is normal. She may continue with Meloxicam and Gabapentin for now, awaiting blood test results. Has office visit coming up for follow-up if pain worsens or if she develops further symptoms advise her to contact the office.

## 2020-09-29 NOTE — Telephone Encounter (Signed)
PT notified

## 2020-10-01 ENCOUNTER — Other Ambulatory Visit: Payer: Self-pay | Admitting: Hospice and Palliative Medicine

## 2020-10-01 DIAGNOSIS — Z1231 Encounter for screening mammogram for malignant neoplasm of breast: Secondary | ICD-10-CM

## 2020-10-01 LAB — ANA: ANA Titer 1: NEGATIVE

## 2020-10-01 LAB — RHEUMATOID FACTOR: Rheumatoid fact SerPl-aCnc: 10.1 IU/mL (ref <14.0)

## 2020-10-01 LAB — SEDIMENTATION RATE: Sed Rate: 9 mm/hr (ref 0–32)

## 2020-10-02 ENCOUNTER — Other Ambulatory Visit: Payer: Self-pay | Admitting: Hospice and Palliative Medicine

## 2020-10-02 DIAGNOSIS — F411 Generalized anxiety disorder: Secondary | ICD-10-CM

## 2020-10-13 ENCOUNTER — Ambulatory Visit: Payer: BC Managed Care – PPO | Admitting: Hospice and Palliative Medicine

## 2020-10-16 ENCOUNTER — Encounter: Payer: Self-pay | Admitting: Hospice and Palliative Medicine

## 2020-10-16 ENCOUNTER — Other Ambulatory Visit: Payer: Self-pay

## 2020-10-16 ENCOUNTER — Ambulatory Visit (INDEPENDENT_AMBULATORY_CARE_PROVIDER_SITE_OTHER): Payer: BC Managed Care – PPO | Admitting: Hospice and Palliative Medicine

## 2020-10-16 VITALS — BP 124/88 | HR 91 | Temp 97.2°F | Resp 16 | Ht 63.0 in | Wt 194.2 lb

## 2020-10-16 DIAGNOSIS — K760 Fatty (change of) liver, not elsewhere classified: Secondary | ICD-10-CM

## 2020-10-16 DIAGNOSIS — R5383 Other fatigue: Secondary | ICD-10-CM | POA: Diagnosis not present

## 2020-10-16 DIAGNOSIS — F411 Generalized anxiety disorder: Secondary | ICD-10-CM

## 2020-10-16 DIAGNOSIS — M25552 Pain in left hip: Secondary | ICD-10-CM | POA: Diagnosis not present

## 2020-10-16 DIAGNOSIS — Z1231 Encounter for screening mammogram for malignant neoplasm of breast: Secondary | ICD-10-CM

## 2020-10-16 MED ORDER — ALPRAZOLAM 0.5 MG PO TABS: 0.5000 mg | ORAL_TABLET | Freq: Every day | ORAL | 1 refills | Status: DC | PRN

## 2020-10-16 NOTE — Progress Notes (Signed)
[facility] [place], [place] [address]  Internal MEDICINE  Office Visit Note  Patient Name: Yvette Humphrey  [id number]  [id number]  Date of Service: 10/19/2020  Chief Complaint  Patient presents with  . Follow-up  . Hypertension    HPI Patient is here for routine follow-up Continues to have left hip pain--xray negative, ANA, sed rate and RA testing negative Gabapentin previously prescribed for separate injury not helping pain or numbness in left hip or lower extremity Reviewed labs from 2021--elevated liver enzymes--known history of NASH had work-up at [facility] in 2016 denies excessive alcohol intake, abnormal lipid panel and elevated ferritin  Having increased anxiety due to husband recently hospitalized for infection and requiring intense frequent dressing changes she is now responsible for at home--prior to this incident, anxiety well controlled on Zoloft and Wellbutrin, requesting refills of alprazolam she uses as needed   Current Medication: Outpatient Encounter Medications as of 10/16/2020  Medication Sig  . ALPRAZolam (XANAX) 0.5 MG tablet Take 1 tablet (0.5 mg total) by mouth daily as needed for anxiety.  Marland Kitchen buPROPion (WELLBUTRIN) 75 MG tablet Take 1 tablet (75 mg total) by mouth 2 (two) times daily.  . Butalbital-APAP-Caffeine (FIORICET) 50-300-40 MG CAPS Take 1 capsule by mouth as needed. migraines  . cetirizine (ZYRTEC) 10 MG tablet Take 10 mg by mouth daily.  . cyanocobalamin 500 MCG tablet Take 500 mcg by mouth daily.  Marland Kitchen dicyclomine (BENTYL) 10 MG capsule Take 1 to 2 capsules po TID prn cramping  . fluconazole (DIFLUCAN) 150 MG tablet Take 1 tab po once  Repeat again if symptoms persist  . fluticasone (FLONASE) 50 MCG/ACT nasal spray Place 2 sprays into both nostrils daily.  . fluticasone (FLOVENT HFA) 110 MCG/ACT inhaler Take 2 puffs twice aday for reactive airway disease  . gabapentin (NEURONTIN) 100 MG capsule Take 2 capsules po QAM and 2  capsules po QPM  . Lysine 1000 MG TABS Take by mouth daily as needed.  . Magnesium 250 MG TABS Take by mouth daily.  . meloxicam (MOBIC) 15 MG tablet Take 1 tablet (15 mg total) by mouth daily.  . metoprolol succinate (TOPROL-XL) 50 MG 24 hr tablet Take 1 tablet (50 mg total) by mouth daily. Take with or immediately following a meal.  . montelukast (SINGULAIR) 10 MG tablet TAKE 1 TABLET(10 MG) BY MOUTH AT BEDTIME  . POTASSIUM CHLORIDE PO Take 10 mcg by mouth daily.  . sertraline (ZOLOFT) 100 MG tablet TAKE 1 TABLET(100 MG) BY MOUTH DAILY  . traZODone (DESYREL) 100 MG tablet Take 1 tablet (100 mg total) by mouth at bedtime as needed for sleep.  . [DISCONTINUED] ALPRAZolam (XANAX) 0.5 MG tablet Take 1 tablet (0.5 mg total) by mouth daily as needed for anxiety.   No facility-administered encounter medications on file as of 10/16/2020.    Surgical History: Past Surgical History:  Procedure Laterality Date  . CHOLECYSTECTOMY    . ENDOMETRIAL ABLATION     approx 2009  . ESOPHAGOGASTRODUODENOSCOPY    . REDUCTION MAMMAPLASTY Bilateral 2004  . TONSILLECTOMY    . TUBAL LIGATION    . ULNAR NERVE TRANSPOSITION Left 06/08/2016   Procedure: SUBCUTANEOUS ANTERIOR ULNAR NERVE TRANSPOSITION LEFT ELBOW;  Surgeon: Corky Mull, MD;  Location: [facility];  Service: Orthopedics;  Laterality: Left;    Medical History: Past Medical History:  Diagnosis Date  . Complication of anesthesia   . Headache    migraines, 1x/mo  . Hypertension   .  Motion sickness    back seat cars  . PONV (postoperative nausea and vomiting)   . Tachycardia   . Wears contact lenses     Family History: Family History  Problem Relation Age of Onset  . Breast cancer Paternal Grandmother     Social History   Socioeconomic History  . Marital status: Married    Spouse name: Not on file  . Number of children: Not on file  . Years of education: Not on file  . Highest education level: Not on file  Occupational  History  . Not on file  Tobacco Use  . Smoking status: Never Smoker  . Smokeless tobacco: Never Used  Vaping Use  . Vaping Use: Never used  Substance and Sexual Activity  . Alcohol use: Yes    Alcohol/week: 1.0 standard drink    Types: 1 Glasses of wine per week    Comment: occasionally  . Drug use: No  . Sexual activity: Not on file  Other Topics Concern  . Not on file  Social History Narrative  . Not on file   Social Determinants of Health   Financial Resource Strain: Not on file  Food Insecurity: Not on file  Transportation Needs: Not on file  Physical Activity: Not on file  Stress: Not on file  Social Connections: Not on file  Intimate Partner Violence: Not on file      Review of Systems  Constitutional: Negative for chills, diaphoresis and fatigue.  HENT: Negative for ear pain, postnasal drip and sinus pressure.   Eyes: Negative for photophobia, discharge, redness, itching and visual disturbance.  Respiratory: Negative for cough, shortness of breath and wheezing.   Cardiovascular: Negative for chest pain, palpitations and leg swelling.  Gastrointestinal: Negative for abdominal pain, constipation, diarrhea, nausea and vomiting.  Genitourinary: Negative for dysuria and flank pain.  Musculoskeletal: Negative for arthralgias, back pain, gait problem and neck pain.       Left hip pain  Skin: Negative for color change.  Allergic/Immunologic: Negative for environmental allergies and food allergies.  Neurological: Positive for numbness. Negative for dizziness and headaches.       Left lower extremity numbness  Hematological: Does not bruise/bleed easily.  Psychiatric/Behavioral: Negative for agitation, behavioral problems (depression) and hallucinations.    Vital Signs: BP 124/88   Pulse 91   Temp (!) 97.2 F (36.2 C)   Resp 16   Ht 5' 3"  (1.6 m)   Wt 194 lb 3.2 oz (88.1 kg)   SpO2 98%   BMI 34.40 kg/m    Physical Exam Vitals reviewed.  Constitutional:       Appearance: She is obese.  Cardiovascular:     Rate and Rhythm: Normal rate and regular rhythm.     Pulses: Normal pulses.     Heart sounds: Normal heart sounds.  Pulmonary:     Effort: Pulmonary effort is normal.     Breath sounds: Normal breath sounds.  Abdominal:     General: Abdomen is flat.     Palpations: Abdomen is soft.  Musculoskeletal:     Cervical back: Normal range of motion.     Left hip: Normal.     Comments: Intermittent pain with ambulation and positional changes, ROM intact  Skin:    General: Skin is warm.  Neurological:     General: No focal deficit present.     Mental Status: She is alert and oriented to person, place, and time. Mental status is at baseline.  Psychiatric:  Mood and Affect: Mood normal.        Behavior: Behavior normal.        Thought Content: Thought content normal.        Judgment: Judgment normal.    Assessment/Plan: 1. Acute hip pain, left Negative hip imaging, will proceed with lumbar imaging due to lower extremity numbness and ongoing pain - DG Lumbar Spine Complete; Future  2. 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 GAD (generalized anxiety disorder) Acute increase in anxiety due to personal issues, continue with Zoloft and Wellbturin, may utilize alprazolam as needed - ALPRAZolam (XANAX) 0.5 MG tablet; Take 1 tablet (0.5 mg total) by mouth daily as needed for anxiety.  Dispense: 30 tablet; Refill: 1  4. Other fatigue - CBC w/Diff/Platelet - Comprehensive Metabolic Panel (CMET) - Lipid Panel With LDL/HDL Ratio - TSH + free T4 - Vitamin B12 - Folate - Iron and TIBC - Ferritin  General Counseling: Norva verbalizes understanding of the findings of todays visit and agrees with plan of treatment. I have discussed any further diagnostic evaluation  that may be needed or ordered today. We also reviewed her medications today. she has been encouraged to call the office with any questions or concerns that should arise related to todays visit.    Orders Placed This Encounter  Procedures  . DG Lumbar Spine Complete  . CBC w/Diff/Platelet  . Comprehensive Metabolic Panel (CMET)  . Lipid Panel With LDL/HDL Ratio  . TSH + free T4  . Vitamin B12  . Folate  . Iron and TIBC  . Ferritin    Meds ordered this encounter  Medications  . ALPRAZolam (XANAX) 0.5 MG tablet    Sig: Take 1 tablet (0.5 mg total) by mouth daily as needed for anxiety.    Dispense:  30 tablet    Refill:  1    Time spent: 30 Minutes Time spent includes review of chart, medications, test results and follow-up plan with the patient.  This patient was seen by Theodoro Grist AGNP-C in Collaboration with Dr Lavera Guise as a part of collaborative care agreement     Tanna Furry. Anslie Spadafora AGNP-C Internal medicine

## 2020-10-19 ENCOUNTER — Encounter: Payer: Self-pay | Admitting: Hospice and Palliative Medicine

## 2020-10-19 ENCOUNTER — Telehealth: Payer: Self-pay

## 2020-10-19 NOTE — Telephone Encounter (Signed)
Called and advised pt that taylor placed order for a lumbar spine xray

## 2020-10-22 ENCOUNTER — Other Ambulatory Visit: Payer: Self-pay

## 2020-10-22 ENCOUNTER — Ambulatory Visit
Admission: RE | Admit: 2020-10-22 | Discharge: 2020-10-22 | Disposition: A | Discharge status: Home / Self Care | Payer: BC Managed Care – PPO | Source: Ambulatory Visit | Attending: Hospice and Palliative Medicine | Admitting: Hospice and Palliative Medicine

## 2020-10-22 DIAGNOSIS — M25552 Pain in left hip: Secondary | ICD-10-CM | POA: Insufficient documentation

## 2020-10-23 LAB — LIPID PANEL WITH LDL/HDL RATIO
Cholesterol, Total: 221 mg/dL — ABNORMAL HIGH (ref 100–199)
HDL: 39 mg/dL — ABNORMAL LOW (ref >39)
LDL Chol Calc (NIH): 122 mg/dL — ABNORMAL HIGH (ref 0–99)
LDL/HDL Ratio: 3.1 ratio (ref 0.0–3.2)
Triglycerides: 338 mg/dL — ABNORMAL HIGH (ref 0–149)
VLDL Cholesterol Cal: 60 mg/dL — ABNORMAL HIGH (ref 5–40)

## 2020-10-23 LAB — COMPREHENSIVE METABOLIC PANEL
ALT: 33 IU/L — ABNORMAL HIGH (ref 0–32)
AST: 23 IU/L (ref 0–40)
Albumin/Globulin Ratio: 1.9 (ref 1.2–2.2)
Albumin: 4.6 g/dL (ref 3.8–4.8)
Alkaline Phosphatase: 57 IU/L (ref 44–121)
BUN/Creatinine Ratio: 23 (ref 9–23)
BUN: 14 mg/dL (ref 6–24)
Bilirubin Total: 0.2 mg/dL (ref 0.0–1.2)
CO2: 20 mmol/L (ref 20–29)
Calcium: 9.4 mg/dL (ref 8.7–10.2)
Chloride: 103 mmol/L (ref 96–106)
Creatinine, Ser: 0.61 mg/dL (ref 0.57–1.00)
Globulin, Total: 2.4 g/dL (ref 1.5–4.5)
Glucose: 100 mg/dL — ABNORMAL HIGH (ref 65–99)
Potassium: 4.3 mmol/L (ref 3.5–5.2)
Sodium: 141 mmol/L (ref 134–144)
Total Protein: 7 g/dL (ref 6.0–8.5)
eGFR: 111 mL/min/{1.73_m2} (ref >59)

## 2020-10-23 LAB — CBC WITH DIFFERENTIAL/PLATELET
Basophils Absolute: 0.1 10*3/uL (ref 0.0–0.2)
Basos: 1 %
EOS (ABSOLUTE): 0.2 10*3/uL (ref 0.0–0.4)
Eos: 2 %
Hematocrit: 38 % (ref 34.0–46.6)
Hemoglobin: 13 g/dL (ref 11.1–15.9)
Immature Grans (Abs): 0.1 10*3/uL (ref 0.0–0.1)
Immature Granulocytes: 1 %
Lymphocytes Absolute: 2.1 10*3/uL (ref 0.7–3.1)
Lymphs: 29 %
MCH: 30.4 pg (ref 26.6–33.0)
MCHC: 34.2 g/dL (ref 31.5–35.7)
MCV: 89 fL (ref 79–97)
Monocytes Absolute: 0.4 10*3/uL (ref 0.1–0.9)
Monocytes: 6 %
Neutrophils Absolute: 4.6 10*3/uL (ref 1.4–7.0)
Neutrophils: 61 %
Platelets: 267 10*3/uL (ref 150–450)
RBC: 4.28 x10E6/uL (ref 3.77–5.28)
RDW: 11.9 % (ref 11.7–15.4)
WBC: 7.4 10*3/uL (ref 3.4–10.8)

## 2020-10-23 LAB — TSH+FREE T4
Free T4: 1.19 ng/dL (ref 0.82–1.77)
TSH: 1.27 u[IU]/mL (ref 0.450–4.500)

## 2020-10-23 LAB — IRON AND TIBC
Iron Saturation: 35 % (ref 15–55)
Iron: 95 ug/dL (ref 27–159)
Total Iron Binding Capacity: 269 ug/dL (ref 250–450)
UIBC: 174 ug/dL (ref 131–425)

## 2020-10-23 LAB — VITAMIN B12: Vitamin B-12: 452 pg/mL (ref 232–1245)

## 2020-10-23 LAB — FOLATE: Folate: 19.4 ng/mL (ref >3.0)

## 2020-10-23 LAB — FERRITIN: Ferritin: 335 ng/mL — ABNORMAL HIGH (ref 15–150)

## 2020-11-02 ENCOUNTER — Other Ambulatory Visit: Payer: Self-pay

## 2020-11-02 ENCOUNTER — Ambulatory Visit
Admission: RE | Admit: 2020-11-02 | Discharge: 2020-11-02 | Disposition: A | Discharge status: Home / Self Care | Payer: BC Managed Care – PPO | Source: Ambulatory Visit | Attending: Hospice and Palliative Medicine | Admitting: Hospice and Palliative Medicine

## 2020-11-02 DIAGNOSIS — Z1231 Encounter for screening mammogram for malignant neoplasm of breast: Secondary | ICD-10-CM | POA: Insufficient documentation

## 2020-11-05 ENCOUNTER — Encounter: Payer: Self-pay | Admitting: Hospice and Palliative Medicine

## 2020-11-05 ENCOUNTER — Other Ambulatory Visit: Payer: Self-pay | Admitting: Hospice and Palliative Medicine

## 2020-11-05 MED ORDER — GABAPENTIN 100 MG PO CAPS: ORAL_CAPSULE | ORAL | 1 refills | Status: DC

## 2020-11-05 NOTE — Progress Notes (Signed)
Labs reviewed, will discuss at upcoming visit.

## 2020-11-30 ENCOUNTER — Other Ambulatory Visit: Payer: Self-pay | Admitting: Internal Medicine

## 2020-11-30 ENCOUNTER — Other Ambulatory Visit: Payer: Self-pay | Admitting: Nurse Practitioner

## 2020-11-30 DIAGNOSIS — B379 Candidiasis, unspecified: Secondary | ICD-10-CM

## 2020-11-30 DIAGNOSIS — F5101 Primary insomnia: Secondary | ICD-10-CM

## 2020-12-01 ENCOUNTER — Encounter: Payer: Self-pay | Admitting: Hospice and Palliative Medicine

## 2020-12-01 ENCOUNTER — Other Ambulatory Visit: Payer: Self-pay

## 2020-12-01 ENCOUNTER — Other Ambulatory Visit: Payer: Self-pay | Admitting: Hospice and Palliative Medicine

## 2020-12-01 ENCOUNTER — Ambulatory Visit: Payer: BC Managed Care – PPO | Admitting: Hospice and Palliative Medicine

## 2020-12-01 VITALS — BP 128/82 | HR 93 | Temp 98.5°F | Resp 16 | Ht 63.0 in | Wt 199.6 lb

## 2020-12-01 DIAGNOSIS — K760 Fatty (change of) liver, not elsewhere classified: Secondary | ICD-10-CM

## 2020-12-01 DIAGNOSIS — R002 Palpitations: Secondary | ICD-10-CM | POA: Diagnosis not present

## 2020-12-01 DIAGNOSIS — M25471 Effusion, right ankle: Secondary | ICD-10-CM | POA: Diagnosis not present

## 2020-12-01 DIAGNOSIS — Z6835 Body mass index (BMI) 35.0-35.9, adult: Secondary | ICD-10-CM | POA: Diagnosis not present

## 2020-12-01 DIAGNOSIS — M25472 Effusion, left ankle: Secondary | ICD-10-CM

## 2020-12-01 DIAGNOSIS — E782 Mixed hyperlipidemia: Secondary | ICD-10-CM

## 2020-12-01 DIAGNOSIS — B379 Candidiasis, unspecified: Secondary | ICD-10-CM

## 2020-12-01 MED ORDER — ROSUVASTATIN CALCIUM 5 MG PO TABS: 5.0000 mg | ORAL_TABLET | Freq: Every day | ORAL | 3 refills | Status: DC

## 2020-12-01 NOTE — Telephone Encounter (Signed)
Pt was here today, not sure if pt still needs this.  Please review and fill if needed.

## 2020-12-01 NOTE — Progress Notes (Signed)
[facility] [place], [place] [address]  Internal MEDICINE  Office Visit Note  Patient Name: Yvette Humphrey  [id number]  [id number]  Date of Service: 12/08/2020  Chief Complaint  Patient presents with  . Follow-up  . Hypertension    review blood work results    HPI Patient is here for routine follow-up Reviewed her recent labs: Abnormal lipid panel--triglyceride levels significantly elevated Ferritin continues to be elevated--has been elevated in the past, underwent testing with [facility] for possible liver disease but all testing came back normal per her reports Last US revealed mild fatty infiltration of liver  Had an episode over the weekend where she felt her heart racing and noticed her ankles were swollen Started on metoprolol in her 30's for rapid heart rate Has single episode without further symptoms  Liver enzymes--US will need updateing  Wanting to discuss weight loss options to help with weight loss as well as lowering her elevated lipid levels  Current Medication: Outpatient Encounter Medications as of 12/01/2020  Medication Sig  . ALPRAZolam (XANAX) 0.5 MG tablet Take 1 tablet (0.5 mg total) by mouth daily as needed for anxiety.  Marland Kitchen buPROPion (WELLBUTRIN) 75 MG tablet Take 1 tablet (75 mg total) by mouth 2 (two) times daily.  . Butalbital-APAP-Caffeine (FIORICET) 50-300-40 MG CAPS Take 1 capsule by mouth as needed. migraines  . cetirizine (ZYRTEC) 10 MG tablet Take 10 mg by mouth daily.  . cyanocobalamin 500 MCG tablet Take 500 mcg by mouth daily.  Marland Kitchen dicyclomine (BENTYL) 10 MG capsule Take 1 to 2 capsules po TID prn cramping  . fluticasone (FLONASE) 50 MCG/ACT nasal spray Place 2 sprays into both nostrils daily.  . fluticasone (FLOVENT HFA) 110 MCG/ACT inhaler Take 2 puffs twice aday for reactive airway disease  . gabapentin (NEURONTIN) 100 MG capsule Take two capsule by mouth in AM, take two capsule by mouth in afternoon, take four capsules by  mouth in PM.  . Lysine 1000 MG TABS Take by mouth daily as needed.  . Magnesium 250 MG TABS Take by mouth daily.  . meloxicam (MOBIC) 15 MG tablet Take 1 tablet (15 mg total) by mouth daily.  . metoprolol succinate (TOPROL-XL) 50 MG 24 hr tablet Take 1 tablet (50 mg total) by mouth daily. Take with or immediately following a meal.  . montelukast (SINGULAIR) 10 MG tablet TAKE 1 TABLET(10 MG) BY MOUTH AT BEDTIME  . POTASSIUM CHLORIDE PO Take 10 mcg by mouth daily.  . rosuvastatin (CRESTOR) 5 MG tablet Take 1 tablet (5 mg total) by mouth daily.  . sertraline (ZOLOFT) 100 MG tablet TAKE 1 TABLET(100 MG) BY MOUTH DAILY  . [DISCONTINUED] fluconazole (DIFLUCAN) 150 MG tablet Take 1 tab po once  Repeat again if symptoms persist  . [DISCONTINUED] traZODone (DESYREL) 100 MG tablet Take 1 tablet (100 mg total) by mouth at bedtime as needed for sleep.   No facility-administered encounter medications on file as of 12/01/2020.    Surgical History: Past Surgical History:  Procedure Laterality Date  . CHOLECYSTECTOMY    . ENDOMETRIAL ABLATION     approx 2009  . ESOPHAGOGASTRODUODENOSCOPY    . REDUCTION MAMMAPLASTY Bilateral 2004  . TONSILLECTOMY    . TUBAL LIGATION    . ULNAR NERVE TRANSPOSITION Left 06/08/2016   Procedure: SUBCUTANEOUS ANTERIOR ULNAR NERVE TRANSPOSITION LEFT ELBOW;  Surgeon: Corky Mull, MD;  Location: [place];  Service: Orthopedics;  Laterality: Left;    Medical History: Past Medical History:  Diagnosis Date  . Complication of anesthesia   . Headache    migraines, 1x/mo  . Hypertension   . Motion sickness    back seat cars  . PONV (postoperative nausea and vomiting)   . Tachycardia   . Wears contact lenses     Family History: Family History  Problem Relation Age of Onset  . Breast cancer Paternal Grandmother     Social History   Socioeconomic History  . Marital status: Married    Spouse name: Not on file  . Number of children: Not on file  . Years  of education: Not on file  . Highest education level: Not on file  Occupational History  . Not on file  Tobacco Use  . Smoking status: Never Smoker  . Smokeless tobacco: Never Used  Vaping Use  . Vaping Use: Never used  Substance and Sexual Activity  . Alcohol use: Yes    Alcohol/week: 1.0 standard drink    Types: 1 Glasses of wine per week    Comment: occasionally  . Drug use: No  . Sexual activity: Not on file  Other Topics Concern  . Not on file  Social History Narrative  . Not on file   Social Determinants of Health   Financial Resource Strain: Not on file  Food Insecurity: Not on file  Transportation Needs: Not on file  Physical Activity: Not on file  Stress: Not on file  Social Connections: Not on file  Intimate Partner Violence: Not on file      Review of Systems  Constitutional: Negative for chills, diaphoresis and fatigue.  HENT: Negative for ear pain, postnasal drip and sinus pressure.   Eyes: Negative for photophobia, discharge, redness, itching and visual disturbance.  Respiratory: Negative for cough, shortness of breath and wheezing.   Cardiovascular: Negative for chest pain, palpitations and leg swelling.  Gastrointestinal: Negative for abdominal pain, constipation, diarrhea, nausea and vomiting.  Genitourinary: Negative for dysuria and flank pain.  Musculoskeletal: Negative for arthralgias, back pain, gait problem and neck pain.  Skin: Negative for color change.  Allergic/Immunologic: Negative for environmental allergies and food allergies.  Neurological: Negative for dizziness and headaches.  Hematological: Does not bruise/bleed easily.  Psychiatric/Behavioral: Negative for agitation, behavioral problems (depression) and hallucinations.    Vital Signs: BP 128/82   Pulse 93   Temp 98.5 F (36.9 C)   Resp 16   Ht 5' 3"  (1.6 m)   Wt 199 lb 9.6 oz (90.5 kg)   SpO2 96%   BMI 35.36 kg/m    Physical Exam Vitals reviewed.  Constitutional:       Appearance: Normal appearance. She is obese.  Cardiovascular:     Rate and Rhythm: Normal rate and regular rhythm.     Pulses: Normal pulses.     Heart sounds: Normal heart sounds.  Pulmonary:     Effort: Pulmonary effort is normal.     Breath sounds: Normal breath sounds.  Abdominal:     General: Abdomen is flat.     Palpations: Abdomen is soft.  Musculoskeletal:        General: Normal range of motion.     Cervical back: Normal range of motion.  Skin:    General: Skin is warm.  Neurological:     General: No focal deficit present.     Mental Status: She is alert and oriented to person, place, and time. Mental status is at baseline.  Psychiatric:        Mood and  Affect: Mood normal.        Behavior: Behavior normal.        Thought Content: Thought content normal.        Judgment: Judgment normal.    Assessment/Plan: 1. Mixed hyperlipidemia Start low dose rosuvastatin--monitor closely - rosuvastatin (CRESTOR) 5 MG tablet; Take 1 tablet (5 mg total) by mouth daily.  Dispense: 90 tablet; Refill: 3  2. Ankle edema, bilateral Obtain and review echocardiogram for possible underlying cardiac etiology contributing to symptoms - ECHOCARDIOGRAM COMPLETE; Future  3. Palpitations Obtain and review echocardiogram for possible underlying cardiac etiology contributing to symptoms - ECHOCARDIOGRAM COMPLETE; Future  4. 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 BMI 35.0-35.9,adult Advised to start taking both doses of Wellbutrin in the morning to help with appetite suppression May consider adding low dose Topamax Comorbid conditions include HTN and NASH Obesity Counseling: Risk Assessment: An assessment of behavioral risk factors was made today and includes lack of exercise sedentary lifestyle, lack of portion control and poor dietary habits.  Risk Modification Advice: She  was counseled on portion control guidelines. Restricting daily caloric intake to 1800. The detrimental long term effects of obesity on her health and ongoing poor compliance was also discussed with the patient.  General Counseling: aleese kamps understanding of the findings of todays visit and agrees with plan of treatment. I have discussed any further diagnostic evaluation that may be needed or ordered today. We also reviewed her medications today. she has been encouraged to call the office with any questions or concerns that should arise related to todays visit.    Orders Placed This Encounter  Procedures  . ECHOCARDIOGRAM COMPLETE    Meds ordered this encounter  Medications  . rosuvastatin (CRESTOR) 5 MG tablet    Sig: Take 1 tablet (5 mg total) by mouth daily.    Dispense:  90 tablet    Refill:  3    Time spent: 30 Minutes Time spent includes review of chart, medications, test results and follow-up plan with the patient.  This patient was seen by Theodoro Grist AGNP-C in Collaboration with Dr Lavera Guise as a part of collaborative care agreement     Tanna Furry. Bubber Rothert AGNP-C Internal medicine

## 2020-12-08 ENCOUNTER — Other Ambulatory Visit: Payer: Self-pay | Admitting: Hospice and Palliative Medicine

## 2020-12-08 ENCOUNTER — Encounter: Payer: Self-pay | Admitting: Hospice and Palliative Medicine

## 2020-12-08 DIAGNOSIS — F331 Major depressive disorder, recurrent, moderate: Secondary | ICD-10-CM

## 2020-12-29 ENCOUNTER — Encounter: Payer: Self-pay | Admitting: Internal Medicine

## 2020-12-29 ENCOUNTER — Other Ambulatory Visit: Payer: Self-pay | Admitting: Internal Medicine

## 2020-12-29 ENCOUNTER — Ambulatory Visit (INDEPENDENT_AMBULATORY_CARE_PROVIDER_SITE_OTHER): Payer: BC Managed Care – PPO | Admitting: Internal Medicine

## 2020-12-29 ENCOUNTER — Telehealth: Payer: Self-pay

## 2020-12-29 VITALS — HR 84 | Temp 98.7°F | Resp 16 | Ht 63.0 in | Wt 191.0 lb

## 2020-12-29 DIAGNOSIS — J40 Bronchitis, not specified as acute or chronic: Secondary | ICD-10-CM | POA: Diagnosis not present

## 2020-12-29 DIAGNOSIS — U071 COVID-19: Secondary | ICD-10-CM | POA: Diagnosis not present

## 2020-12-29 MED ORDER — ALBUTEROL SULFATE HFA 108 (90 BASE) MCG/ACT IN AERS: 2.0000 | INHALATION_SPRAY | Freq: Four times a day (QID) | RESPIRATORY_TRACT | 0 refills | Status: DC | PRN

## 2020-12-29 MED ORDER — AZITHROMYCIN 250 MG PO TABS: ORAL_TABLET | ORAL | 0 refills | Status: DC

## 2020-12-29 NOTE — Progress Notes (Signed)
[facility], [place] [address]  Internal MEDICINE  Telephone Visit  Patient Name: Yvette Humphrey  [id number]  [id number]  Date of Service: 12/29/2020  I connected with the patient at 1013 by telephone and verified the patients identity using two identifiers.   I discussed the limitations, risks, security and privacy concerns of performing an evaluation and management service by telephone and the availability of in person appointments. I also discussed with the patient that there may be a patient responsible charge related to the service.  The patient expressed understanding and agrees to proceed.    Chief Complaint  Patient presents with  . Acute Visit    Bad cough, congestion, OTC meds not working, 12-16-20 tested positive for covid  . Telephone Screen    Either phone or video is fine   . Telephone Assessment    [phone]    HPI Patient is connected today with video visit.  She was tested + for COVID 12/16/2020.  She tested herself at home and was isolating herself she initially thought she was improving however her cough continues to get worse she is using Flovent inhaler she is not short of breath she denies any fever or chills.  She is also vaccinated for COVID    Current Medication: Outpatient Encounter Medications as of 12/29/2020  Medication Sig  . azithromycin (ZITHROMAX) 250 MG tablet Take one tab a day for 10 days for uri  . [DISCONTINUED] albuterol (VENTOLIN HFA) 108 (90 Base) MCG/ACT inhaler Inhale 2 puffs into the lungs every 6 (six) hours as needed for wheezing or shortness of breath.  . ALPRAZolam (XANAX) 0.5 MG tablet Take 1 tablet (0.5 mg total) by mouth daily as needed for anxiety.  Marland Kitchen buPROPion (WELLBUTRIN) 75 MG tablet TAKE 1 TABLET(75 MG) BY MOUTH TWICE DAILY  . Butalbital-APAP-Caffeine (FIORICET) 50-300-40 MG CAPS Take 1 capsule by mouth as needed. migraines  . cetirizine (ZYRTEC) 10 MG tablet Take 10 mg by mouth daily.  .  cyanocobalamin 500 MCG tablet Take 500 mcg by mouth daily.  Marland Kitchen dicyclomine (BENTYL) 10 MG capsule Take 1 to 2 capsules po TID prn cramping  . fluconazole (DIFLUCAN) 150 MG tablet TAKE 1 TABLET BY MOUTH 1 TIME. REPEAT AGAIN IF SYMPTOMS PERSIST  . fluticasone (FLONASE) 50 MCG/ACT nasal spray Place 2 sprays into both nostrils daily.  . fluticasone (FLOVENT HFA) 110 MCG/ACT inhaler Take 2 puffs twice aday for reactive airway disease  . gabapentin (NEURONTIN) 100 MG capsule Take two capsule by mouth in AM, take two capsule by mouth in afternoon, take four capsules by mouth in PM.  . Lysine 1000 MG TABS Take by mouth daily as needed.  . Magnesium 250 MG TABS Take by mouth daily.  . meloxicam (MOBIC) 15 MG tablet Take 1 tablet (15 mg total) by mouth daily.  . metoprolol succinate (TOPROL-XL) 50 MG 24 hr tablet Take 1 tablet (50 mg total) by mouth daily. Take with or immediately following a meal.  . montelukast (SINGULAIR) 10 MG tablet TAKE 1 TABLET(10 MG) BY MOUTH AT BEDTIME  . POTASSIUM CHLORIDE PO Take 10 mcg by mouth daily.  . rosuvastatin (CRESTOR) 5 MG tablet Take 1 tablet (5 mg total) by mouth daily.  . sertraline (ZOLOFT) 100 MG tablet TAKE 1 TABLET(100 MG) BY MOUTH DAILY  . traZODone (DESYREL) 100 MG tablet TAKE 1 TABLET(100 MG) BY MOUTH AT BEDTIME AS NEEDED FOR SLEEP   No facility-administered encounter medications on file as of 12/29/2020.  Surgical History: Past Surgical History:  Procedure Laterality Date  . CHOLECYSTECTOMY    . ENDOMETRIAL ABLATION     approx 2009  . ESOPHAGOGASTRODUODENOSCOPY    . REDUCTION MAMMAPLASTY Bilateral 2004  . TONSILLECTOMY    . TUBAL LIGATION    . ULNAR NERVE TRANSPOSITION Left 06/08/2016   Procedure: SUBCUTANEOUS ANTERIOR ULNAR NERVE TRANSPOSITION LEFT ELBOW;  Surgeon: Corky Mull, MD;  Location: [facility];  Service: Orthopedics;  Laterality: Left;    Medical History: Past Medical History:  Diagnosis Date  . Complication of anesthesia    . Headache    migraines, 1x/mo  . Hypertension   . Motion sickness    back seat cars  . PONV (postoperative nausea and vomiting)   . Tachycardia   . Wears contact lenses     Family History: Family History  Problem Relation Age of Onset  . Breast cancer Paternal Grandmother     Social History   Socioeconomic History  . Marital status: Married    Spouse name: Not on file  . Number of children: Not on file  . Years of education: Not on file  . Highest education level: Not on file  Occupational History  . Not on file  Tobacco Use  . Smoking status: Never Smoker  . Smokeless tobacco: Never Used  Vaping Use  . Vaping Use: Never used  Substance and Sexual Activity  . Alcohol use: Yes    Alcohol/week: 1.0 standard drink    Types: 1 Glasses of wine per week    Comment: occasionally  . Drug use: No  . Sexual activity: Not on file  Other Topics Concern  . Not on file  Social History Narrative  . Not on file   Social Determinants of Health   Financial Resource Strain: Not on file  Food Insecurity: Not on file  Transportation Needs: Not on file  Physical Activity: Not on file  Stress: Not on file  Social Connections: Not on file  Intimate Partner Violence: Not on file      Review of Systems  Constitutional: Negative for fatigue and fever.  HENT: Negative for congestion, mouth sores and postnasal drip.   Respiratory: Positive for cough.   Cardiovascular: Negative for chest pain.  Genitourinary: Negative for flank pain.  Psychiatric/Behavioral: Negative.     Vital Signs: Pulse 84   Temp 98.7 F (37.1 C)   Resp 16   Ht 5' 3"  (1.6 m)   Wt 191 lb (86.6 kg)   SpO2 96%   BMI 33.83 kg/m    Observation/Objective:  seems to be in no acute distress she was able to carry out conversation    Assessment/Plan: 1. Bronchitis due to COVID-19 virus Patient continues to have reactive airway disease due to COVID virus she is instructed to continue her Flovent 2  puffs twice a day. Will add albuterol as a rescue inhaler. Will also add azithromycin as prescribed today patient will send a message through [place] if she needs further assistance  General Counseling: valli randol understanding of the findings of today's phone visit and agrees with plan of treatment. I have discussed any further diagnostic evaluation that may be needed or ordered today. We also reviewed her medications today. she has been encouraged to call the office with any questions or concerns that should arise related to todays visit.   Meds ordered this encounter  Medications  . azithromycin (ZITHROMAX) 250 MG tablet    Sig: Take one tab a day for  10 days for uri    Dispense:  10 tablet    Refill:  0  . DISCONTD: albuterol (VENTOLIN HFA) 108 (90 Base) MCG/ACT inhaler    Sig: Inhale 2 puffs into the lungs every 6 (six) hours as needed for wheezing or shortness of breath.    Dispense:  8 g    Refill:  0    Time spent:10 Minutes    Dr Lavera Guise Internal medicine

## 2021-01-01 ENCOUNTER — Other Ambulatory Visit: Payer: Self-pay | Admitting: Internal Medicine

## 2021-01-01 ENCOUNTER — Encounter: Payer: Self-pay | Admitting: Internal Medicine

## 2021-01-01 MED ORDER — HYDROCOD POLST-CPM POLST ER 10-8 MG/5ML PO SUER: 5.0000 mL | Freq: Two times a day (BID) | ORAL | 0 refills | Status: DC | PRN

## 2021-01-06 ENCOUNTER — Other Ambulatory Visit: Payer: Self-pay | Admitting: Internal Medicine

## 2021-01-06 DIAGNOSIS — F411 Generalized anxiety disorder: Secondary | ICD-10-CM

## 2021-01-07 ENCOUNTER — Other Ambulatory Visit: Payer: Self-pay | Admitting: Internal Medicine

## 2021-01-07 DIAGNOSIS — J40 Bronchitis, not specified as acute or chronic: Secondary | ICD-10-CM

## 2021-01-07 DIAGNOSIS — U071 COVID-19: Secondary | ICD-10-CM

## 2021-01-07 MED ORDER — FLOVENT HFA 110 MCG/ACT IN AERO: 2.0000 | INHALATION_SPRAY | Freq: Two times a day (BID) | RESPIRATORY_TRACT | 12 refills | Status: DC

## 2021-01-07 NOTE — Progress Notes (Signed)
cxr 

## 2021-01-08 ENCOUNTER — Ambulatory Visit
Admission: RE | Admit: 2021-01-08 | Discharge: 2021-01-08 | Disposition: A | Discharge status: Home / Self Care | Payer: BC Managed Care – PPO | Attending: Internal Medicine | Admitting: Internal Medicine

## 2021-01-08 ENCOUNTER — Ambulatory Visit
Admission: RE | Admit: 2021-01-08 | Discharge: 2021-01-08 | Disposition: A | Discharge status: Home / Self Care | Payer: BC Managed Care – PPO | Source: Ambulatory Visit | Attending: Internal Medicine | Admitting: Internal Medicine

## 2021-01-08 DIAGNOSIS — J40 Bronchitis, not specified as acute or chronic: Secondary | ICD-10-CM

## 2021-01-08 DIAGNOSIS — U071 COVID-19: Secondary | ICD-10-CM | POA: Diagnosis present

## 2021-01-13 ENCOUNTER — Other Ambulatory Visit: Payer: BC Managed Care – PPO

## 2021-01-19 ENCOUNTER — Encounter: Payer: Self-pay | Admitting: Internal Medicine

## 2021-01-19 ENCOUNTER — Other Ambulatory Visit: Payer: Self-pay

## 2021-01-19 ENCOUNTER — Ambulatory Visit (INDEPENDENT_AMBULATORY_CARE_PROVIDER_SITE_OTHER): Payer: BC Managed Care – PPO | Admitting: Internal Medicine

## 2021-01-19 VITALS — BP 112/80 | HR 85 | Temp 97.2°F | Resp 16 | Ht 62.5 in | Wt 192.6 lb

## 2021-01-19 DIAGNOSIS — R7309 Other abnormal glucose: Secondary | ICD-10-CM

## 2021-01-19 DIAGNOSIS — Z79899 Other long term (current) drug therapy: Secondary | ICD-10-CM

## 2021-01-19 DIAGNOSIS — R053 Chronic cough: Secondary | ICD-10-CM

## 2021-01-19 DIAGNOSIS — J3089 Other allergic rhinitis: Secondary | ICD-10-CM

## 2021-01-19 DIAGNOSIS — F411 Generalized anxiety disorder: Secondary | ICD-10-CM | POA: Diagnosis not present

## 2021-01-19 DIAGNOSIS — I10 Essential (primary) hypertension: Secondary | ICD-10-CM | POA: Diagnosis not present

## 2021-01-19 DIAGNOSIS — U099 Post covid-19 condition, unspecified: Secondary | ICD-10-CM

## 2021-01-19 MED ORDER — ALPRAZOLAM 0.5 MG PO TABS
0.5000 mg | ORAL_TABLET | Freq: Every day | ORAL | 2 refills | Status: DC | PRN
Start: 2021-01-19 — End: 2021-05-13

## 2021-01-19 MED ORDER — METOPROLOL SUCCINATE ER 50 MG PO TB24: ORAL_TABLET | ORAL | 1 refills | Status: DC

## 2021-01-19 MED ORDER — RYBELSUS 3 MG PO TABS: 3.0000 mg | ORAL_TABLET | Freq: Every day | ORAL | 0 refills | Status: DC

## 2021-01-19 MED ORDER — AZELASTINE HCL 0.1 % NA SOLN: NASAL | 12 refills | Status: DC

## 2021-01-19 MED ORDER — RYBELSUS 3 MG PO TABS: ORAL_TABLET | ORAL | 3 refills | Status: DC

## 2021-01-19 NOTE — Progress Notes (Signed)
[facility] [place], [place] [address]  Internal MEDICINE  Office Visit Note  Patient Name: Yvette Humphrey  [id number]  [id number]  Date of Service: 01/30/2021  Chief Complaint  Patient presents with   Follow-up    Refills, discuss meds, still has lingering cough from covid 5 weeks ago    Hypertension   Hyperlipidemia   Quality Metric Gaps    Pneumovax, shingrix    HPI  Patient is here for routine follow-up. Patient was sick with COVID bronchitis 5 weeks ago and still is having lingering cough.  She is using albuterol and Flovent inhalers denies any shortness of breath fever or chills.  Does have postnasal drip She is concerned about her weight gain baseline glucose is abnormal, will like to try Rybelsus Patient also has generalized anxiety disorder takes Wellbutrin and Xanax as needed she is also on Zoloft  Current Medication: Outpatient Encounter Medications as of 01/19/2021  Medication Sig   azelastine (ASTELIN) 0.1 % nasal spray Use 2 sq in each nostril every 6- 8 hrs qd   Semaglutide (RYBELSUS) 3 MG TABS Take one tab a day for abnormal glucose   [DISCONTINUED] Semaglutide (RYBELSUS) 3 MG TABS Take 3 mg by mouth daily at 6 (six) AM.   albuterol (VENTOLIN HFA) 108 (90 Base) MCG/ACT inhaler INHALE 2 PUFFS INTO THE LUNGS EVERY 6 HOURS AS NEEDED FOR WHEEZING OR SHORTNESS OF BREATH   ALPRAZolam (XANAX) 0.5 MG tablet Take 1 tablet (0.5 mg total) by mouth daily as needed for anxiety.   azithromycin (ZITHROMAX) 250 MG tablet Take one tab a day for 10 days for uri   buPROPion (WELLBUTRIN) 75 MG tablet TAKE 1 TABLET(75 MG) BY MOUTH TWICE DAILY   Butalbital-APAP-Caffeine (FIORICET) 50-300-40 MG CAPS Take 1 capsule by mouth as needed. migraines   cetirizine (ZYRTEC) 10 MG tablet Take 10 mg by mouth daily.   chlorpheniramine-HYDROcodone (TUSSIONEX PENNKINETIC ER) 10-8 MG/5ML SUER Take 5 mLs by mouth every 12 (twelve) hours as needed for cough.   cyanocobalamin  500 MCG tablet Take 500 mcg by mouth daily.   dicyclomine (BENTYL) 10 MG capsule Take 1 to 2 capsules po TID prn cramping   fluconazole (DIFLUCAN) 150 MG tablet TAKE 1 TABLET BY MOUTH 1 TIME. REPEAT AGAIN IF SYMPTOMS PERSIST   fluticasone (FLONASE) 50 MCG/ACT nasal spray Place 2 sprays into both nostrils daily.   fluticasone (FLOVENT HFA) 110 MCG/ACT inhaler Take 2 puffs twice aday for reactive airway disease   fluticasone (FLOVENT HFA) 110 MCG/ACT inhaler Inhale 2 puffs into the lungs in the morning and at bedtime.   gabapentin (NEURONTIN) 100 MG capsule TAKE 2 CAPSULES BY MOUTH IN AM, TAKE 2 CAPSULES BY MOUTH IN AFTERNOON, TAKE 4 CAPSULES BY MOUTH IN PM   Lysine 1000 MG TABS Take by mouth daily as needed.   Magnesium 250 MG TABS Take by mouth daily.   meloxicam (MOBIC) 15 MG tablet Take 1 tablet (15 mg total) by mouth daily.   metoprolol succinate (TOPROL-XL) 50 MG 24 hr tablet Take one tab po qhs for blood pressure   POTASSIUM CHLORIDE PO Take 10 mcg by mouth daily.   rosuvastatin (CRESTOR) 5 MG tablet Take 1 tablet (5 mg total) by mouth daily.   sertraline (ZOLOFT) 100 MG tablet TAKE 1 TABLET(100 MG) BY MOUTH DAILY   traZODone (DESYREL) 100 MG tablet TAKE 1 TABLET(100 MG) BY MOUTH AT BEDTIME AS NEEDED FOR SLEEP   [DISCONTINUED] ALPRAZolam (XANAX) 0.5 MG tablet Take  1 tablet (0.5 mg total) by mouth daily as needed for anxiety.   [DISCONTINUED] metoprolol succinate (TOPROL-XL) 50 MG 24 hr tablet Take 1 tablet (50 mg total) by mouth daily. Take with or immediately following a meal.   [DISCONTINUED] montelukast (SINGULAIR) 10 MG tablet TAKE 1 TABLET(10 MG) BY MOUTH AT BEDTIME   No facility-administered encounter medications on file as of 01/19/2021.    Surgical History: Past Surgical History:  Procedure Laterality Date   CHOLECYSTECTOMY     ENDOMETRIAL ABLATION     approx 2009   ESOPHAGOGASTRODUODENOSCOPY     REDUCTION MAMMAPLASTY Bilateral 2004   TONSILLECTOMY     TUBAL LIGATION      ULNAR NERVE TRANSPOSITION Left 06/08/2016   Procedure: SUBCUTANEOUS ANTERIOR ULNAR NERVE TRANSPOSITION LEFT ELBOW;  Surgeon: Corky Mull, MD;  Location: [place];  Service: Orthopedics;  Laterality: Left;    Medical History: Past Medical History:  Diagnosis Date   Complication of anesthesia    Headache    migraines, 1x/mo   Hyperlipidemia    Hypertension    Motion sickness    back seat cars   PONV (postoperative nausea and vomiting)    Tachycardia    Wears contact lenses     Family History: Family History  Problem Relation Age of Onset   Hypertension Mother    Hyperlipidemia Mother    Hyperlipidemia Maternal Grandfather    COPD Maternal Grandfather    Breast cancer Paternal Grandmother     Social History   Socioeconomic History   Marital status: Married    Spouse name: Not on file   Number of children: Not on file   Years of education: Not on file   Highest education level: Not on file  Occupational History   Not on file  Tobacco Use   Smoking status: Never   Smokeless tobacco: Never  Vaping Use   Vaping Use: Never used  Substance and Sexual Activity   Alcohol use: Yes    Alcohol/week: 1.0 standard drink    Types: 1 Glasses of wine per week    Comment: occasionally   Drug use: No   Sexual activity: Not on file  Other Topics Concern   Not on file  Social History Narrative   Not on file   Social Determinants of Health   Financial Resource Strain: Not on file  Food Insecurity: Not on file  Transportation Needs: Not on file  Physical Activity: Not on file  Stress: Not on file  Social Connections: Not on file  Intimate Partner Violence: Not on file      Review of Systems  Constitutional:  Negative for chills, fatigue and unexpected weight change.  HENT:  Positive for postnasal drip. Negative for congestion, rhinorrhea, sneezing and sore throat.   Eyes:  Negative for redness.  Respiratory:  Positive for cough. Negative for chest tightness  and shortness of breath.   Cardiovascular:  Negative for chest pain and palpitations.  Gastrointestinal:  Negative for abdominal pain, constipation, diarrhea, nausea and vomiting.  Genitourinary:  Negative for dysuria and frequency.  Musculoskeletal:  Negative for arthralgias, back pain, joint swelling and neck pain.  Skin:  Negative for rash.  Neurological: Negative.  Negative for tremors and numbness.  Hematological:  Negative for adenopathy. Does not bruise/bleed easily.  Psychiatric/Behavioral:  Negative for behavioral problems (Depression), sleep disturbance and suicidal ideas. The patient is not nervous/anxious.    Vital Signs: BP 112/80   Pulse 85   Temp (!) 97.2 F (  36.2 C)   Resp 16   Ht 5' 2.5" (1.588 m)   Wt 192 lb 9.6 oz (87.4 kg)   SpO2 98%   BMI 34.67 kg/m    Physical Exam Constitutional:      Appearance: Normal appearance.  HENT:     Head: Normocephalic and atraumatic.     Nose: Nose normal.     Mouth/Throat:     Mouth: Mucous membranes are moist.     Pharynx: No posterior oropharyngeal erythema.  Eyes:     Extraocular Movements: Extraocular movements intact.     Pupils: Pupils are equal, round, and reactive to light.  Cardiovascular:     Pulses: Normal pulses.     Heart sounds: Normal heart sounds.  Pulmonary:     Effort: Pulmonary effort is normal.     Breath sounds: Wheezing present.  Neurological:     General: No focal deficit present.     Mental Status: She is alert.  Psychiatric:        Mood and Affect: Mood normal.        Behavior: Behavior normal.       Assessment/Plan: 1. Essential (primary) hypertension Patient is to continue on her metoprolol 50 mg once a day to control blood pressure - metoprolol succinate (TOPROL-XL) 50 MG 24 hr tablet; Take one tab po qhs for blood pressure  Dispense: 90 tablet; Refill: 1  2. GAD (generalized anxiety disorder) Continue Wellbutrin and Zoloft and takes Xanax as needed symptoms are controlled -  ALPRAZolam (XANAX) 0.5 MG tablet; Take 1 tablet (0.5 mg total) by mouth daily as needed for anxiety.  Dispense: 30 tablet; Refill: 2  3. Encounter for long-term (current) use of medications PDMP is reviewed by [place] substance control policy  4. Abnormal glucose Patient has abnormal baseline glucose and has gained weight since COVID.  Start Rybelsus 3 mg once a day, her BMI today is 35 - Semaglutide (RYBELSUS) 3 MG TABS; Take one tab a day for abnormal glucose  Dispense: 30 tablet; Refill: 3  5. Post-COVID chronic cough Patient is continue her Flovent, albuterol as needed she is also on Singulair we will add Flonase  6. Non-seasonal allergic rhinitis, unspecified trigger Patient continues to have postnasal drip cough might need CT of sinuses and allergy testing in future we will add azelastine nasal spray - azelastine (ASTELIN) 0.1 % nasal spray; Use 2 sq in each nostril every 6- 8 hrs qd  Dispense: 30 mL; Refill: 12   General Counseling: Yvette Humphrey verbalizes understanding of the findings of todays visit and agrees with plan of treatment. I have discussed any further diagnostic evaluation that may be needed or ordered today. We also reviewed her medications today. she has been encouraged to call the office with any questions or concerns that should arise related to todays visit.    No orders of the defined types were placed in this encounter.   Meds ordered this encounter  Medications   azelastine (ASTELIN) 0.1 % nasal spray    Sig: Use 2 sq in each nostril every 6- 8 hrs qd    Dispense:  30 mL    Refill:  12   ALPRAZolam (XANAX) 0.5 MG tablet    Sig: Take 1 tablet (0.5 mg total) by mouth daily as needed for anxiety.    Dispense:  30 tablet    Refill:  2   metoprolol succinate (TOPROL-XL) 50 MG 24 hr tablet    Sig: Take one tab po qhs for blood  pressure    Dispense:  90 tablet    Refill:  1   DISCONTD: Semaglutide (RYBELSUS) 3 MG TABS    Sig: Take 3 mg by mouth daily at 6 (six) AM.     Dispense:  30 tablet    Refill:  0    <SAMPLE>   Semaglutide (RYBELSUS) 3 MG TABS    Sig: Take one tab a day for abnormal glucose    Dispense:  30 tablet    Refill:  3    Total time spent:30 Minutes Time spent includes review of chart, medications, test results, and follow up plan with the patient.   [place] Controlled Substance Database was reviewed by me.   Dr Lavera Guise Internal medicine

## 2021-01-19 NOTE — Telephone Encounter (Signed)
Refill sent to pharmacy.   

## 2021-01-27 ENCOUNTER — Other Ambulatory Visit: Payer: Self-pay

## 2021-01-27 MED ORDER — MONTELUKAST SODIUM 10 MG PO TABS: ORAL_TABLET | ORAL | 3 refills | Status: DC

## 2021-02-15 ENCOUNTER — Other Ambulatory Visit: Payer: Self-pay

## 2021-02-15 MED ORDER — RYBELSUS 7 MG PO TABS: 7.0000 mg | ORAL_TABLET | Freq: Every day | ORAL | 2 refills | Status: DC

## 2021-03-02 ENCOUNTER — Telehealth: Payer: Self-pay

## 2021-03-02 NOTE — Telephone Encounter (Signed)
Left vm for 03/03/21 appointment screening-Toni

## 2021-03-03 ENCOUNTER — Encounter: Payer: Self-pay | Admitting: Nurse Practitioner

## 2021-03-03 ENCOUNTER — Ambulatory Visit (INDEPENDENT_AMBULATORY_CARE_PROVIDER_SITE_OTHER): Payer: BC Managed Care – PPO | Admitting: Nurse Practitioner

## 2021-03-03 ENCOUNTER — Other Ambulatory Visit: Payer: Self-pay

## 2021-03-03 VITALS — BP 124/82 | HR 82 | Temp 98.4°F | Resp 16 | Ht 62.0 in | Wt 186.8 lb

## 2021-03-03 DIAGNOSIS — R7301 Impaired fasting glucose: Secondary | ICD-10-CM

## 2021-03-03 DIAGNOSIS — M064 Inflammatory polyarthropathy: Secondary | ICD-10-CM

## 2021-03-03 DIAGNOSIS — Z6835 Body mass index (BMI) 35.0-35.9, adult: Secondary | ICD-10-CM | POA: Diagnosis not present

## 2021-03-03 DIAGNOSIS — F5101 Primary insomnia: Secondary | ICD-10-CM

## 2021-03-03 DIAGNOSIS — E782 Mixed hyperlipidemia: Secondary | ICD-10-CM

## 2021-03-03 LAB — POCT GLYCOSYLATED HEMOGLOBIN (HGB A1C): Hemoglobin A1C: 4.7 % (ref 4.0–5.6)

## 2021-03-03 MED ORDER — ROSUVASTATIN CALCIUM 5 MG PO TABS
5.0000 mg | ORAL_TABLET | Freq: Every day | ORAL | 3 refills | Status: DC
Start: 2021-03-03 — End: 2022-03-01

## 2021-03-03 MED ORDER — TRAZODONE HCL 100 MG PO TABS: 100.0000 mg | ORAL_TABLET | Freq: Every day | ORAL | 0 refills | Status: DC

## 2021-03-03 MED ORDER — GABAPENTIN 100 MG PO CAPS: ORAL_CAPSULE | ORAL | 1 refills | Status: DC

## 2021-03-03 NOTE — Progress Notes (Signed)
[facility] [place], [place] [address]  Internal MEDICINE  Office Visit Note  Patient Name: Yvette Humphrey  [id number]  [id number]  Date of Service: 03/03/2021  Chief Complaint  Patient presents with   Follow-up    Weight loss, med refill    HPI Yvette Humphrey presents for a follow up for weight loss management and medication refill. She was started on Rybelsus 3 mg daily in June. She was increased to 7 mg daily on 02/15/21 and has been tolerating the increased dose. Yvette Humphrey has lost 6 lbs since her last office visit on 01/19/21. She came back home from vacation this past Sunday and states that she did well maintaining her diet and weight while she was on vacation. She states that she gained a couple pounds on vacation but has already lost those pounds since returning. She wants to continue using Rybelsus 52m daily and does not need a refill at this time because she just picked one up this month.  She reports having trouble sleeping often. She does take trazodone 100 mg at bedtime but it is not helping. She states that she is able to fall asleep but has difficulty going back to sleep. Discussed some sleep hygiene interventions and also discussed pharmacological options. She is going to think about trying Quviviq.  -A1C was checked today due to impaired fasting glucose on previous labs. She is not diabetic or prediabetic and is taking Rybelsus strictly for weight loss. Her A1C was wnl at 4.7.    Current Medication: Outpatient Encounter Medications as of 03/03/2021  Medication Sig   albuterol (VENTOLIN HFA) 108 (90 Base) MCG/ACT inhaler INHALE 2 PUFFS INTO THE LUNGS EVERY 6 HOURS AS NEEDED FOR WHEEZING OR SHORTNESS OF BREATH   ALPRAZolam (XANAX) 0.5 MG tablet Take 1 tablet (0.5 mg total) by mouth daily as needed for anxiety.   azelastine (ASTELIN) 0.1 % nasal spray Use 2 sq in each nostril every 6- 8 hrs qd   buPROPion (WELLBUTRIN) 75 MG tablet TAKE 1 TABLET(75 MG) BY MOUTH TWICE  DAILY   Butalbital-APAP-Caffeine (FIORICET) 50-300-40 MG CAPS Take 1 capsule by mouth as needed. migraines   cetirizine (ZYRTEC) 10 MG tablet Take 10 mg by mouth daily.   chlorpheniramine-HYDROcodone (TUSSIONEX PENNKINETIC ER) 10-8 MG/5ML SUER Take 5 mLs by mouth every 12 (twelve) hours as needed for cough.   fluconazole (DIFLUCAN) 150 MG tablet TAKE 1 TABLET BY MOUTH 1 TIME. REPEAT AGAIN IF SYMPTOMS PERSIST   fluticasone (FLONASE) 50 MCG/ACT nasal spray Place 2 sprays into both nostrils daily.   fluticasone (FLOVENT HFA) 110 MCG/ACT inhaler Take 2 puffs twice aday for reactive airway disease   fluticasone (FLOVENT HFA) 110 MCG/ACT inhaler Inhale 2 puffs into the lungs in the morning and at bedtime.   Lysine 1000 MG TABS Take by mouth daily as needed.   Magnesium 250 MG TABS Take by mouth daily.   meloxicam (MOBIC) 15 MG tablet Take 1 tablet (15 mg total) by mouth daily.   metoprolol succinate (TOPROL-XL) 50 MG 24 hr tablet Take one tab po qhs for blood pressure   montelukast (SINGULAIR) 10 MG tablet TAKE 1 TABLET(10 MG) BY MOUTH AT BEDTIME   POTASSIUM CHLORIDE PO Take 10 mcg by mouth daily.   Semaglutide (RYBELSUS) 7 MG TABS Take 7 mg by mouth daily.   sertraline (ZOLOFT) 100 MG tablet TAKE 1 TABLET(100 MG) BY MOUTH DAILY   traZODone (DESYREL) 100 MG tablet Take 1-2 tablets (100-200 mg total) by mouth  at bedtime.   [DISCONTINUED] gabapentin (NEURONTIN) 100 MG capsule TAKE 2 CAPSULES BY MOUTH IN AM, TAKE 2 CAPSULES BY MOUTH IN AFTERNOON, TAKE 4 CAPSULES BY MOUTH IN PM   [DISCONTINUED] rosuvastatin (CRESTOR) 5 MG tablet Take 1 tablet (5 mg total) by mouth daily.   [DISCONTINUED] traZODone (DESYREL) 100 MG tablet TAKE 1 TABLET(100 MG) BY MOUTH AT BEDTIME AS NEEDED FOR SLEEP   cyanocobalamin 500 MCG tablet Take 500 mcg by mouth daily. (Patient not taking: Reported on 03/03/2021)   gabapentin (NEURONTIN) 100 MG capsule TAKE 2 CAPSULES BY MOUTH IN AM, TAKE 2 CAPSULES BY MOUTH IN AFTERNOON, TAKE 4  CAPSULES BY MOUTH IN PM   rosuvastatin (CRESTOR) 5 MG tablet Take 1 tablet (5 mg total) by mouth daily.   [DISCONTINUED] azithromycin (ZITHROMAX) 250 MG tablet Take one tab a day for 10 days for uri (Patient not taking: Reported on 03/03/2021)   [DISCONTINUED] dicyclomine (BENTYL) 10 MG capsule Take 1 to 2 capsules po TID prn cramping (Patient not taking: Reported on 03/03/2021)   No facility-administered encounter medications on file as of 03/03/2021.    Surgical History: Past Surgical History:  Procedure Laterality Date   CHOLECYSTECTOMY     ENDOMETRIAL ABLATION     approx 2009   ESOPHAGOGASTRODUODENOSCOPY     REDUCTION MAMMAPLASTY Bilateral 2004   TONSILLECTOMY     TUBAL LIGATION     ULNAR NERVE TRANSPOSITION Left 06/08/2016   Procedure: SUBCUTANEOUS ANTERIOR ULNAR NERVE TRANSPOSITION LEFT ELBOW;  Surgeon: Corky Mull, MD;  Location: [place];  Service: Orthopedics;  Laterality: Left;    Medical History: Past Medical History:  Diagnosis Date   Complication of anesthesia    Headache    migraines, 1x/mo   Hyperlipidemia    Hypertension    Motion sickness    back seat cars   PONV (postoperative nausea and vomiting)    Tachycardia    Wears contact lenses     Family History: Family History  Problem Relation Age of Onset   Hypertension Mother    Hyperlipidemia Mother    Hyperlipidemia Maternal Grandfather    COPD Maternal Grandfather    Breast cancer Paternal Grandmother     Social History   Socioeconomic History   Marital status: Married    Spouse name: Not on file   Number of children: Not on file   Years of education: Not on file   Highest education level: Not on file  Occupational History   Not on file  Tobacco Use   Smoking status: Never   Smokeless tobacco: Never  Vaping Use   Vaping Use: Never used  Substance and Sexual Activity   Alcohol use: Yes    Alcohol/week: 1.0 standard drink    Types: 1 Glasses of wine per week    Comment:  occasionally   Drug use: No   Sexual activity: Not on file  Other Topics Concern   Not on file  Social History Narrative   Not on file   Social Determinants of Health   Financial Resource Strain: Not on file  Food Insecurity: Not on file  Transportation Needs: Not on file  Physical Activity: Not on file  Stress: Not on file  Social Connections: Not on file  Intimate Partner Violence: Not on file      Review of Systems  Constitutional:  Positive for appetite change (decreased appetite is expected with Rybelsus). Negative for chills, fatigue and unexpected weight change.  HENT:  Negative for congestion, rhinorrhea,  sneezing and sore throat.   Eyes:  Negative for redness.  Respiratory:  Negative for cough, chest tightness, shortness of breath and wheezing.   Cardiovascular:  Negative for chest pain and palpitations.  Gastrointestinal:  Negative for abdominal pain, constipation, diarrhea, nausea and vomiting.  Genitourinary:  Negative for dysuria and frequency.  Musculoskeletal:  Positive for arthralgias. Negative for back pain, joint swelling and neck pain.  Skin:  Negative for rash.  Neurological: Negative.  Negative for tremors and numbness.  Hematological:  Negative for adenopathy. Does not bruise/bleed easily.  Psychiatric/Behavioral:  Positive for sleep disturbance. Negative for behavioral problems (Depression), self-injury and suicidal ideas. The patient is not nervous/anxious.    Vital Signs: BP 124/82   Pulse 82   Temp 98.4 F (36.9 C)   Resp 16   Ht 5' 2"  (1.575 m)   Wt 186 lb 12.8 oz (84.7 kg)   SpO2 98%   BMI 34.17 kg/m    Physical Exam Vitals reviewed.  Constitutional:      General: She is not in acute distress.    Appearance: Normal appearance. She is well-developed. She is obese. She is not ill-appearing or diaphoretic.  HENT:     Head: Normocephalic and atraumatic.  Eyes:     Pupils: Pupils are equal, round, and reactive to light.  Neck:      Thyroid: No thyromegaly.     Vascular: No JVD.     Trachea: No tracheal deviation.  Cardiovascular:     Rate and Rhythm: Normal rate and regular rhythm.  Pulmonary:     Effort: Pulmonary effort is normal. No respiratory distress.  Skin:    General: Skin is warm and dry.     Capillary Refill: Capillary refill takes less than 2 seconds.  Neurological:     Mental Status: She is alert and oriented to person, place, and time.  Psychiatric:        Mood and Affect: Mood normal.        Behavior: Behavior normal.     Assessment/Plan: 1. BMI 35.0-35.9,adult Down 6 lbs, will continue weight loss management with Rybelsus in addition to diet and lifestyle modifications. Will follow up in 4 weeks.  2. Primary insomnia Yvette Humphrey is still having difficulty with insomnia, predominantly with sleep maintenance. Trazodone dose increased, instructed Yvette Humphrey to take 1-2 tablets at bedtime, but no more than 2 tablets. Discussed other options, she will think about trying Quviviq and will discuss at follow up visit in 4 weeks.  - traZODone (DESYREL) 100 MG tablet; Take 1-2 tablets (100-200 mg total) by mouth at bedtime.  Dispense: 180 tablet; Refill: 0  3. Impaired fasting glucose Impaired fasting glucose levels on prior labs, checked A1C to establish baseline, level is wnl. 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  - gabapentin (NEURONTIN) 100 MG capsule; TAKE 2 CAPSULES BY MOUTH IN AM, TAKE 2 CAPSULES BY MOUTH IN AFTERNOON, TAKE 4 CAPSULES BY MOUTH IN PM  Dispense: 240 capsule; Refill: 1  5. Mixed hyperlipidemia Stable, taking rosuvastatin, refill ordered.  - rosuvastatin (CRESTOR) 5 MG tablet; Take 1 tablet (5 mg total) by mouth daily.  Dispense: 90 tablet; Refill: 3   General Counseling: Yvette Humphrey understanding of the findings of todays visit and agrees  with plan of treatment. I have discussed any further diagnostic evaluation that may be needed or ordered today. We also reviewed her medications today. she  has been encouraged to call the office with any questions or concerns that should arise related to todays visit.    Orders Placed This Encounter  Procedures   POCT glycosylated hemoglobin (Hb A1C)    Meds ordered this encounter  Medications   rosuvastatin (CRESTOR) 5 MG tablet    Sig: Take 1 tablet (5 mg total) by mouth daily.    Dispense:  90 tablet    Refill:  3   traZODone (DESYREL) 100 MG tablet    Sig: Take 1-2 tablets (100-200 mg total) by mouth at bedtime.    Dispense:  180 tablet    Refill:  0   gabapentin (NEURONTIN) 100 MG capsule    Sig: TAKE 2 CAPSULES BY MOUTH IN AM, TAKE 2 CAPSULES BY MOUTH IN AFTERNOON, TAKE 4 CAPSULES BY MOUTH IN PM    Dispense:  240 capsule    Refill:  1    Return in about 4 weeks (around 03/31/2021) for F/U, Weight loss, Yvette Humphrey PCP.   Total time spent:30 Minutes Time spent includes review of chart, medications, test results, and follow up plan with the patient.   [place] Controlled Substance Database was reviewed by me.  This patient was seen by Jonetta Osgood, FNP-C in collaboration with Dr. Clayborn Bigness as a part of collaborative care agreement.   Yvette Humphrey R. Valetta Fuller, MSN, FNP-C Internal medicine

## 2021-03-13 DIAGNOSIS — R7301 Impaired fasting glucose: Secondary | ICD-10-CM | POA: Insufficient documentation

## 2021-03-13 DIAGNOSIS — E782 Mixed hyperlipidemia: Secondary | ICD-10-CM | POA: Insufficient documentation

## 2021-04-01 ENCOUNTER — Other Ambulatory Visit: Payer: Self-pay | Admitting: Adult Health

## 2021-04-01 ENCOUNTER — Ambulatory Visit: Payer: BC Managed Care – PPO | Admitting: Nurse Practitioner

## 2021-04-01 DIAGNOSIS — G43009 Migraine without aura, not intractable, without status migrainosus: Secondary | ICD-10-CM

## 2021-04-07 ENCOUNTER — Ambulatory Visit (INDEPENDENT_AMBULATORY_CARE_PROVIDER_SITE_OTHER): Payer: BC Managed Care – PPO | Admitting: Nurse Practitioner

## 2021-04-07 ENCOUNTER — Other Ambulatory Visit: Payer: Self-pay

## 2021-04-07 ENCOUNTER — Encounter: Payer: Self-pay | Admitting: Nurse Practitioner

## 2021-04-07 VITALS — BP 134/84 | HR 94 | Temp 98.3°F | Resp 16 | Ht 63.0 in | Wt 191.0 lb

## 2021-04-07 DIAGNOSIS — R200 Anesthesia of skin: Secondary | ICD-10-CM

## 2021-04-07 DIAGNOSIS — E6609 Other obesity due to excess calories: Secondary | ICD-10-CM | POA: Diagnosis not present

## 2021-04-07 DIAGNOSIS — Z6833 Body mass index (BMI) 33.0-33.9, adult: Secondary | ICD-10-CM

## 2021-04-07 DIAGNOSIS — M79602 Pain in left arm: Secondary | ICD-10-CM | POA: Diagnosis not present

## 2021-04-07 DIAGNOSIS — R7301 Impaired fasting glucose: Secondary | ICD-10-CM

## 2021-04-07 MED ORDER — RYBELSUS 3 MG PO TABS: 3.0000 mg | ORAL_TABLET | Freq: Every day | ORAL | 3 refills | Status: DC

## 2021-04-07 NOTE — Progress Notes (Signed)
[facility] [place], [place] [address]  Internal MEDICINE  Office Visit Note  Patient Name: Yvette Humphrey  [id number]  [id number]  Date of Service: 04/07/2021  Chief Complaint  Patient presents with   Follow-up    Weight loss, discuss meds, discuss left hand/achy wrist    Hyperlipidemia   Hypertension    HPI Yvette Humphrey presents for a follow up visit to discuss weight loss management, medication and left hand and wrist pain . Yvette Humphrey has gained 5 lbs since her last office visit. Yvette Humphrey had been trying to lose weight without medications but is now requesting medication to help her lose weight.    Current Medication: Outpatient Encounter Medications as of 04/07/2021  Medication Sig   ALPRAZolam (XANAX) 0.5 MG tablet Take 1 tablet (0.5 mg total) by mouth daily as needed for anxiety.   azelastine (ASTELIN) 0.1 % nasal spray Use 2 sq in each nostril every 6- 8 hrs qd   buPROPion (WELLBUTRIN) 75 MG tablet TAKE 1 TABLET(75 MG) BY MOUTH TWICE DAILY   Butalbital-APAP-Caffeine (FIORICET) 50-300-40 MG CAPS Take 1 capsule by mouth as needed. migraines   cetirizine (ZYRTEC) 10 MG tablet Take 10 mg by mouth daily.   cyanocobalamin 500 MCG tablet Take 500 mcg by mouth daily.   fluconazole (DIFLUCAN) 150 MG tablet TAKE 1 TABLET BY MOUTH 1 TIME. REPEAT AGAIN IF SYMPTOMS PERSIST   fluticasone (FLONASE) 50 MCG/ACT nasal spray Place 2 sprays into both nostrils daily.   fluticasone (FLOVENT HFA) 110 MCG/ACT inhaler Inhale 2 puffs into the lungs in the morning and at bedtime.   gabapentin (NEURONTIN) 100 MG capsule TAKE 2 CAPSULES BY MOUTH IN AM, TAKE 2 CAPSULES BY MOUTH IN AFTERNOON, TAKE 4 CAPSULES BY MOUTH IN PM   Lysine 1000 MG TABS Take by mouth daily as needed.   Magnesium 250 MG TABS Take by mouth daily.   meloxicam (MOBIC) 15 MG tablet Take 1 tablet (15 mg total) by mouth daily.   metoprolol succinate (TOPROL-XL) 50 MG 24 hr tablet Take one tab po qhs for blood pressure    montelukast (SINGULAIR) 10 MG tablet TAKE 1 TABLET(10 MG) BY MOUTH AT BEDTIME   POTASSIUM CHLORIDE PO Take 10 mcg by mouth daily.   rosuvastatin (CRESTOR) 5 MG tablet Take 1 tablet (5 mg total) by mouth daily.   Semaglutide (RYBELSUS) 3 MG TABS Take 3 mg by mouth daily. in am on empty stomach, wait 30 minutes prior to eating, drinking or taking other medications   sertraline (ZOLOFT) 100 MG tablet TAKE 1 TABLET(100 MG) BY MOUTH DAILY   traZODone (DESYREL) 100 MG tablet Take 1-2 tablets (100-200 mg total) by mouth at bedtime.   [DISCONTINUED] albuterol (VENTOLIN HFA) 108 (90 Base) MCG/ACT inhaler INHALE 2 PUFFS INTO THE LUNGS EVERY 6 HOURS AS NEEDED FOR WHEEZING OR SHORTNESS OF BREATH (Patient not taking: Reported on 04/07/2021)   [DISCONTINUED] chlorpheniramine-HYDROcodone (TUSSIONEX PENNKINETIC ER) 10-8 MG/5ML SUER Take 5 mLs by mouth every 12 (twelve) hours as needed for cough. (Patient not taking: Reported on 04/07/2021)   [DISCONTINUED] fluticasone (FLOVENT HFA) 110 MCG/ACT inhaler Take 2 puffs twice aday for reactive airway disease (Patient not taking: Reported on 04/07/2021)   [DISCONTINUED] Semaglutide (RYBELSUS) 7 MG TABS Take 7 mg by mouth daily. (Patient not taking: Reported on 04/07/2021)   No facility-administered encounter medications on file as of 04/07/2021.    Surgical History: Past Surgical History:  Procedure Laterality Date   CHOLECYSTECTOMY     ENDOMETRIAL  ABLATION     approx 2009   ESOPHAGOGASTRODUODENOSCOPY     REDUCTION MAMMAPLASTY Bilateral 2004   TONSILLECTOMY     TUBAL LIGATION     ULNAR NERVE TRANSPOSITION Left 06/08/2016   Procedure: SUBCUTANEOUS ANTERIOR ULNAR NERVE TRANSPOSITION LEFT ELBOW;  Surgeon: Corky Mull, MD;  Location: [facility];  Service: Orthopedics;  Laterality: Left;    Medical History: Past Medical History:  Diagnosis Date   Complication of anesthesia    Headache    migraines, 1x/mo   Hyperlipidemia    Hypertension    Motion  sickness    back seat cars   PONV (postoperative nausea and vomiting)    Tachycardia    Wears contact lenses     Family History: Family History  Problem Relation Age of Onset   Hypertension Mother    Hyperlipidemia Mother    Hyperlipidemia Maternal Grandfather    COPD Maternal Grandfather    Breast cancer Paternal Grandmother     Social History   Socioeconomic History   Marital status: Married    Spouse name: Not on file   Number of children: Not on file   Years of education: Not on file   Highest education level: Not on file  Occupational History   Not on file  Tobacco Use   Smoking status: Never   Smokeless tobacco: Never  Vaping Use   Vaping Use: Never used  Substance and Sexual Activity   Alcohol use: Yes    Alcohol/week: 1.0 standard drink    Types: 1 Glasses of wine per week    Comment: occasionally   Drug use: No   Sexual activity: Not on file  Other Topics Concern   Not on file  Social History Narrative   Not on file   Social Determinants of Health   Financial Resource Strain: Not on file  Food Insecurity: Not on file  Transportation Needs: Not on file  Physical Activity: Not on file  Stress: Not on file  Social Connections: Not on file  Intimate Partner Violence: Not on file      Review of Systems  Constitutional:  Negative for chills, fatigue and unexpected weight change.  HENT:  Negative for congestion, rhinorrhea, sneezing and sore throat.   Eyes:  Negative for redness.  Respiratory:  Negative for cough, chest tightness and shortness of breath.   Cardiovascular:  Negative for chest pain and palpitations.  Gastrointestinal:  Negative for abdominal pain, constipation, diarrhea, nausea and vomiting.  Genitourinary:  Negative for dysuria and frequency.  Musculoskeletal:  Negative for arthralgias, back pain, joint swelling and neck pain.  Skin:  Negative for rash.  Neurological: Negative.  Negative for tremors and numbness.  Hematological:   Negative for adenopathy. Does not bruise/bleed easily.  Psychiatric/Behavioral:  Negative for behavioral problems (Depression), sleep disturbance and suicidal ideas. The patient is not nervous/anxious.    Vital Signs: BP 134/84   Pulse 94   Temp 98.3 F (36.8 C)   Resp 16   Ht 5' 3"  (1.6 m)   Wt 191 lb (86.6 kg)   SpO2 97%   BMI 33.83 kg/m    Physical Exam Vitals reviewed.  Constitutional:      General: Yvette Humphrey is not in acute distress.    Appearance: Normal appearance. Yvette Humphrey is obese. Yvette Humphrey is not ill-appearing.  HENT:     Head: Normocephalic and atraumatic.  Eyes:     Extraocular Movements: Extraocular movements intact.     Pupils: Pupils are equal, round,  and reactive to light.  Cardiovascular:     Rate and Rhythm: Normal rate and regular rhythm.  Pulmonary:     Effort: Pulmonary effort is normal. No respiratory distress.  Neurological:     Mental Status: Yvette Humphrey is alert and oriented to person, place, and time.     Cranial Nerves: No cranial nerve deficit.     Coordination: Coordination normal.     Gait: Gait normal.  Psychiatric:        Mood and Affect: Mood normal.        Behavior: Behavior normal.       Assessment/Plan 1. Pain and numbness of left upper extremity Xrays ordered to rule out acute skeletal abnormality - DG Hand Complete Left; Future - DG Elbow Complete Left; Future - DG Wrist Complete Left; Future  2. Impaired fasting glucose Rybelsus prescribed for this problem and to help with weight loss.  - Semaglutide (RYBELSUS) 3 MG TABS; Take 3 mg by mouth daily. in am on empty stomach, wait 30 minutes prior to eating, drinking or taking other medications  Dispense: 30 tablet; Refill: 3  3. Class 1 obesity due to excess calories without serious comorbidity with body mass index (BMI) of 33.0 to 33.9 in adult Discussed continued diet and lifestyle modifications. Start Rybelsus, take as instructed for better absorption of the medication, follow up in 4 weeks.  -  Semaglutide (RYBELSUS) 3 MG TABS; Take 3 mg by mouth daily. in am on empty stomach, wait 30 minutes prior to eating, drinking or taking other medications  Dispense: 30 tablet; Refill: 3   General Counseling: keimora swartout understanding of the findings of todays visit and agrees with plan of treatment. I have discussed any further diagnostic evaluation that may be needed or ordered today. We also reviewed her medications today. Yvette Humphrey has been encouraged to call the office with any questions or concerns that should arise related to todays visit.    Orders Placed This Encounter  Procedures   DG Hand Complete Left   DG Elbow Complete Left   DG Wrist Complete Left    Meds ordered this encounter  Medications   Semaglutide (RYBELSUS) 3 MG TABS    Sig: Take 3 mg by mouth daily. in am on empty stomach, wait 30 minutes prior to eating, drinking or taking other medications    Dispense:  30 tablet    Refill:  3    Return in about 4 weeks (around 05/05/2021) for F/U, Weight loss, Shavanna Furnari PCP.   Total time spent:20 Minutes Time spent includes review of chart, medications, test results, and follow up plan with the patient.   [place] Controlled Substance Database was reviewed by me.  This patient was seen by Jonetta Osgood, FNP-C in collaboration with Dr. Clayborn Bigness as a part of collaborative care agreement.   Logen Heintzelman R. Valetta Fuller, MSN, FNP-C Internal medicine

## 2021-04-22 ENCOUNTER — Encounter: Payer: Self-pay | Admitting: Nurse Practitioner

## 2021-04-22 DIAGNOSIS — R112 Nausea with vomiting, unspecified: Secondary | ICD-10-CM

## 2021-04-22 DIAGNOSIS — T50905A Adverse effect of unspecified drugs, medicaments and biological substances, initial encounter: Secondary | ICD-10-CM

## 2021-04-25 ENCOUNTER — Encounter: Payer: Self-pay | Admitting: Nurse Practitioner

## 2021-04-25 MED ORDER — ONDANSETRON HCL 4 MG PO TABS
4.0000 mg | ORAL_TABLET | Freq: Three times a day (TID) | ORAL | 0 refills | Status: DC | PRN
Start: 2021-04-25 — End: 2021-08-09

## 2021-04-30 ENCOUNTER — Ambulatory Visit
Admission: RE | Admit: 2021-04-30 | Discharge: 2021-04-30 | Disposition: A | Discharge status: Home / Self Care | Payer: BC Managed Care – PPO | Attending: Nurse Practitioner | Admitting: Nurse Practitioner

## 2021-04-30 ENCOUNTER — Ambulatory Visit
Admission: RE | Admit: 2021-04-30 | Discharge: 2021-04-30 | Disposition: A | Discharge status: Home / Self Care | Payer: BC Managed Care – PPO | Source: Ambulatory Visit | Attending: Nurse Practitioner | Admitting: Nurse Practitioner

## 2021-04-30 DIAGNOSIS — M79602 Pain in left arm: Secondary | ICD-10-CM | POA: Diagnosis not present

## 2021-04-30 DIAGNOSIS — R200 Anesthesia of skin: Secondary | ICD-10-CM | POA: Insufficient documentation

## 2021-05-05 ENCOUNTER — Other Ambulatory Visit: Payer: Self-pay

## 2021-05-05 ENCOUNTER — Encounter: Payer: Self-pay | Admitting: Nurse Practitioner

## 2021-05-05 ENCOUNTER — Ambulatory Visit (INDEPENDENT_AMBULATORY_CARE_PROVIDER_SITE_OTHER): Payer: BC Managed Care – PPO | Admitting: Nurse Practitioner

## 2021-05-05 VITALS — BP 134/86 | HR 89 | Temp 98.5°F | Resp 16 | Ht 62.5 in | Wt 186.0 lb

## 2021-05-05 DIAGNOSIS — J0111 Acute recurrent frontal sinusitis: Secondary | ICD-10-CM

## 2021-05-05 DIAGNOSIS — G43009 Migraine without aura, not intractable, without status migrainosus: Secondary | ICD-10-CM

## 2021-05-05 DIAGNOSIS — R200 Anesthesia of skin: Secondary | ICD-10-CM

## 2021-05-05 DIAGNOSIS — M79602 Pain in left arm: Secondary | ICD-10-CM

## 2021-05-05 DIAGNOSIS — E66811 Obesity, class 1: Secondary | ICD-10-CM

## 2021-05-05 DIAGNOSIS — R7301 Impaired fasting glucose: Secondary | ICD-10-CM

## 2021-05-05 DIAGNOSIS — E6609 Other obesity due to excess calories: Secondary | ICD-10-CM

## 2021-05-05 DIAGNOSIS — Z6833 Body mass index (BMI) 33.0-33.9, adult: Secondary | ICD-10-CM

## 2021-05-05 MED ORDER — AZITHROMYCIN 250 MG PO TABS: ORAL_TABLET | ORAL | 0 refills | Status: DC

## 2021-05-05 MED ORDER — BENZONATATE 100 MG PO CAPS: 100.0000 mg | ORAL_CAPSULE | Freq: Two times a day (BID) | ORAL | 0 refills | Status: DC | PRN

## 2021-05-05 MED ORDER — UBRELVY 100 MG PO TABS: 100.0000 mg | ORAL_TABLET | Freq: Every day | ORAL | 3 refills | Status: DC | PRN

## 2021-05-05 NOTE — Progress Notes (Signed)
[facility] [place], [place] [address]  Internal MEDICINE  Office Visit Note  Patient Name: Yvette Humphrey  [id number]  [id number]  Date of Service: 05/05/2021  Chief Complaint  Patient presents with   Follow-up   Weight Loss    HPI Loral presents for a follow-up visit for weight loss.  She started taking Rybelsus again and the nausea is subsiding and not as bad as the first time.  She plans to continue to take Rybelsus.  She has lost 5 pounds since her last office visit.  She also has migraines and has been taking Fioricet for acute migraines but her insurance does not cover this medication anymore for her.  She has tried triptans and Fioricet.  She has 1-2 migraines per month so a preventative may not be necessary at this time.  At her previous office visit she was reporting pain and numbness of left upper extremity which is unchanged since that visit.  X-rays were done of the left hand, wrist and elbow.  She has had surgery on that arm and hand before.  The x-rays only showed an ulnar minus variance. She also c/o symptoms of sinus infection. She reports fever, chills, nasal congestion,     Current Medication: Outpatient Encounter Medications as of 05/05/2021  Medication Sig   ALPRAZolam (XANAX) 0.5 MG tablet Take 1 tablet (0.5 mg total) by mouth daily as needed for anxiety.   azelastine (ASTELIN) 0.1 % nasal spray Use 2 sq in each nostril every 6- 8 hrs qd   azithromycin (ZITHROMAX) 250 MG tablet Take one tab a day for 10 days for uri   benzonatate (TESSALON) 100 MG capsule Take 1 capsule (100 mg total) by mouth 2 (two) times daily as needed for cough.   buPROPion (WELLBUTRIN) 75 MG tablet TAKE 1 TABLET(75 MG) BY MOUTH TWICE DAILY   Butalbital-APAP-Caffeine (FIORICET) 50-300-40 MG CAPS Take 1 capsule by mouth as needed. migraines   cetirizine (ZYRTEC) 10 MG tablet Take 10 mg by mouth daily.   cyanocobalamin 500 MCG tablet Take 500 mcg by mouth daily.    fluconazole (DIFLUCAN) 150 MG tablet TAKE 1 TABLET BY MOUTH 1 TIME. REPEAT AGAIN IF SYMPTOMS PERSIST   fluticasone (FLONASE) 50 MCG/ACT nasal spray Place 2 sprays into both nostrils daily.   fluticasone (FLOVENT HFA) 110 MCG/ACT inhaler Inhale 2 puffs into the lungs in the morning and at bedtime.   gabapentin (NEURONTIN) 100 MG capsule TAKE 2 CAPSULES BY MOUTH IN AM, TAKE 2 CAPSULES BY MOUTH IN AFTERNOON, TAKE 4 CAPSULES BY MOUTH IN PM   Lysine 1000 MG TABS Take by mouth daily as needed.   Magnesium 250 MG TABS Take by mouth daily.   meloxicam (MOBIC) 15 MG tablet Take 1 tablet (15 mg total) by mouth daily.   metoprolol succinate (TOPROL-XL) 50 MG 24 hr tablet Take one tab po qhs for blood pressure   montelukast (SINGULAIR) 10 MG tablet TAKE 1 TABLET(10 MG) BY MOUTH AT BEDTIME   ondansetron (ZOFRAN) 4 MG tablet Take 1 tablet (4 mg total) by mouth every 8 (eight) hours as needed for nausea or vomiting.   POTASSIUM CHLORIDE PO Take 10 mcg by mouth daily.   rosuvastatin (CRESTOR) 5 MG tablet Take 1 tablet (5 mg total) by mouth daily.   Semaglutide (RYBELSUS) 3 MG TABS Take 3 mg by mouth daily. in am on empty stomach, wait 30 minutes prior to eating, drinking or taking other medications   sertraline (ZOLOFT) 100  MG tablet TAKE 1 TABLET(100 MG) BY MOUTH DAILY   traZODone (DESYREL) 100 MG tablet Take 1-2 tablets (100-200 mg total) by mouth at bedtime.   Ubrogepant (UBRELVY) 100 MG TABS Take 100 mg by mouth daily as needed (acute migraine).   No facility-administered encounter medications on file as of 05/05/2021.    Surgical History: Past Surgical History:  Procedure Laterality Date   CHOLECYSTECTOMY     ENDOMETRIAL ABLATION     approx 2009   ESOPHAGOGASTRODUODENOSCOPY     REDUCTION MAMMAPLASTY Bilateral 2004   TONSILLECTOMY     TUBAL LIGATION     ULNAR NERVE TRANSPOSITION Left 06/08/2016   Procedure: SUBCUTANEOUS ANTERIOR ULNAR NERVE TRANSPOSITION LEFT ELBOW;  Surgeon: Corky Mull, MD;   Location: [facility];  Service: Orthopedics;  Laterality: Left;    Medical History: Past Medical History:  Diagnosis Date   Complication of anesthesia    Headache    migraines, 1x/mo   Hyperlipidemia    Hypertension    Motion sickness    back seat cars   PONV (postoperative nausea and vomiting)    Tachycardia    Wears contact lenses     Family History: Family History  Problem Relation Age of Onset   Hypertension Mother    Hyperlipidemia Mother    Hyperlipidemia Maternal Grandfather    COPD Maternal Grandfather    Breast cancer Paternal Grandmother     Social History   Socioeconomic History   Marital status: Married    Spouse name: Not on file   Number of children: Not on file   Years of education: Not on file   Highest education level: Not on file  Occupational History   Not on file  Tobacco Use   Smoking status: Never   Smokeless tobacco: Never  Vaping Use   Vaping Use: Never used  Substance and Sexual Activity   Alcohol use: Yes    Alcohol/week: 1.0 standard drink    Types: 1 Glasses of wine per week    Comment: occasionally   Drug use: No   Sexual activity: Not on file  Other Topics Concern   Not on file  Social History Narrative   Not on file   Social Determinants of Health   Financial Resource Strain: Not on file  Food Insecurity: Not on file  Transportation Needs: Not on file  Physical Activity: Not on file  Stress: Not on file  Social Connections: Not on file  Intimate Partner Violence: Not on file      Review of Systems  Constitutional:  Positive for chills and fever (last week for 1 day). Negative for fatigue.  HENT:  Positive for congestion, postnasal drip, rhinorrhea, sinus pressure, sinus pain and sneezing. Negative for ear pain, sore throat and trouble swallowing.   Respiratory:  Positive for cough. Negative for chest tightness, shortness of breath and wheezing.   Cardiovascular:  Negative for chest pain and palpitations.   Musculoskeletal:  Negative for myalgias.  Neurological:  Positive for headaches.   Vital Signs: BP 134/86   Pulse 89   Temp 98.5 F (36.9 C)   Resp 16   Ht 5' 2.5" (1.588 m)   Wt 186 lb (84.4 kg)   SpO2 98%   BMI 33.48 kg/m    Physical Exam Vitals reviewed.  Constitutional:      General: She is not in acute distress.    Appearance: Normal appearance. She is obese. She is not ill-appearing.  HENT:  Head: Normocephalic and atraumatic.     Nose: Congestion and rhinorrhea present.  Eyes:     Extraocular Movements: Extraocular movements intact.     Pupils: Pupils are equal, round, and reactive to light.  Cardiovascular:     Rate and Rhythm: Normal rate and regular rhythm.     Pulses: Normal pulses.     Heart sounds: No murmur heard. Pulmonary:     Effort: Pulmonary effort is normal. No respiratory distress.  Neurological:     Mental Status: She is alert and oriented to person, place, and time.     Cranial Nerves: No cranial nerve deficit.     Gait: Gait normal.  Psychiatric:        Mood and Affect: Mood normal.        Behavior: Behavior normal.       Assessment/Plan: 1. Acute recurrent frontal sinusitis Empiric antibiotic treatment prescribed and symptomatic treatment for cough.  - azithromycin (ZITHROMAX) 250 MG tablet; Take one tab a day for 10 days for uri  Dispense: 10 tablet; Refill: 0 - benzonatate (TESSALON) 100 MG capsule; Take 1 capsule (100 mg total) by mouth 2 (two) times daily as needed for cough.  Dispense: 30 capsule; Refill: 0  2. Migraine without aura and without status migrainosus, not intractable Ubrelvy prescribed for acute migraine, patient's insurance will not cover fioricet. Prescription sent to specialty pharmacy, [facility].  - Ubrogepant (UBRELVY) 100 MG TABS; Take 100 mg by mouth daily as needed (acute migraine).  Dispense: 16 tablet; Refill: 3  3. Pain and numbness of left upper extremity Xrays completed and results discussed,  assisted patient with scheduling a new patient visit with orthopedic surgeon and hand specialist Dr. Peggye Ley at [facility] for later in October.   4. Impaired fasting glucose Taking Rybelsus, insurance is covering the medication which was a concern initially but PA went through.   5. Class 1 obesity due to excess calories without serious comorbidity with body mass index (BMI) of 33.0 to 33.9 in adult Has lost 5 more lbs on rybelsus 3 mg. She also has 7 mg strength and will increase dose when she feels she is ready.    General Counseling: toiya morrish understanding of the findings of todays visit and agrees with plan of treatment. I have discussed any further diagnostic evaluation that may be needed or ordered today. We also reviewed her medications today. she has been encouraged to call the office with any questions or concerns that should arise related to todays visit.    No orders of the defined types were placed in this encounter.   Meds ordered this encounter  Medications   azithromycin (ZITHROMAX) 250 MG tablet    Sig: Take one tab a day for 10 days for uri    Dispense:  10 tablet    Refill:  0   benzonatate (TESSALON) 100 MG capsule    Sig: Take 1 capsule (100 mg total) by mouth 2 (two) times daily as needed for cough.    Dispense:  30 capsule    Refill:  0   Ubrogepant (UBRELVY) 100 MG TABS    Sig: Take 100 mg by mouth daily as needed (acute migraine).    Dispense:  16 tablet    Refill:  3    Return in about 2 months (around 07/05/2021) for F/U, Weight loss, Jameriah Trotti PCP.   Total time spent:30 Minutes Time spent includes review of chart, medications, test results, and follow up plan with the patient.  [place] Controlled Substance Database was reviewed by me.  This patient was seen by Jonetta Osgood, FNP-C in collaboration with Dr. Clayborn Bigness as a part of collaborative care agreement.   Jahmad Petrich R. Valetta Fuller, MSN, FNP-C Internal medicine

## 2021-05-12 ENCOUNTER — Other Ambulatory Visit: Payer: Self-pay | Admitting: Internal Medicine

## 2021-05-12 DIAGNOSIS — F411 Generalized anxiety disorder: Secondary | ICD-10-CM

## 2021-05-13 NOTE — Telephone Encounter (Signed)
Pt needs refill of Alprazolam 0.5MG tablets last seen 05/05/21 next appt 06/16/21

## 2021-05-24 ENCOUNTER — Encounter: Payer: Self-pay | Admitting: Nurse Practitioner

## 2021-05-27 ENCOUNTER — Other Ambulatory Visit: Payer: Self-pay | Admitting: Nurse Practitioner

## 2021-05-27 ENCOUNTER — Other Ambulatory Visit: Payer: Self-pay | Admitting: Internal Medicine

## 2021-05-27 DIAGNOSIS — F5101 Primary insomnia: Secondary | ICD-10-CM

## 2021-05-27 NOTE — Telephone Encounter (Signed)
Please see this

## 2021-06-02 ENCOUNTER — Other Ambulatory Visit: Payer: Self-pay | Admitting: Nurse Practitioner

## 2021-06-02 DIAGNOSIS — R0981 Nasal congestion: Secondary | ICD-10-CM

## 2021-06-02 DIAGNOSIS — M064 Inflammatory polyarthropathy: Secondary | ICD-10-CM

## 2021-06-02 MED ORDER — PROMETHAZINE-PHENYLEPHRINE 6.25-5 MG/5ML PO SYRP: 5.0000 mL | ORAL_SOLUTION | ORAL | 0 refills | Status: DC | PRN

## 2021-06-02 MED ORDER — GABAPENTIN 100 MG PO CAPS: ORAL_CAPSULE | ORAL | 5 refills | Status: DC

## 2021-06-15 ENCOUNTER — Encounter: Payer: BC Managed Care – PPO | Admitting: Nurse Practitioner

## 2021-06-16 ENCOUNTER — Encounter: Payer: Self-pay | Admitting: Nurse Practitioner

## 2021-06-16 ENCOUNTER — Ambulatory Visit (INDEPENDENT_AMBULATORY_CARE_PROVIDER_SITE_OTHER): Payer: BC Managed Care – PPO | Admitting: Nurse Practitioner

## 2021-06-16 ENCOUNTER — Other Ambulatory Visit: Payer: Self-pay

## 2021-06-16 VITALS — BP 108/78 | HR 80 | Temp 98.4°F | Resp 16 | Ht 62.0 in | Wt 182.6 lb

## 2021-06-16 DIAGNOSIS — E782 Mixed hyperlipidemia: Secondary | ICD-10-CM | POA: Diagnosis not present

## 2021-06-16 DIAGNOSIS — I10 Essential (primary) hypertension: Secondary | ICD-10-CM | POA: Diagnosis not present

## 2021-06-16 DIAGNOSIS — F331 Major depressive disorder, recurrent, moderate: Secondary | ICD-10-CM | POA: Diagnosis not present

## 2021-06-16 DIAGNOSIS — R300 Dysuria: Secondary | ICD-10-CM

## 2021-06-16 DIAGNOSIS — Z1211 Encounter for screening for malignant neoplasm of colon: Secondary | ICD-10-CM

## 2021-06-16 DIAGNOSIS — Z3009 Encounter for other general counseling and advice on contraception: Secondary | ICD-10-CM

## 2021-06-16 DIAGNOSIS — Z0001 Encounter for general adult medical examination with abnormal findings: Secondary | ICD-10-CM

## 2021-06-16 DIAGNOSIS — Z1212 Encounter for screening for malignant neoplasm of rectum: Secondary | ICD-10-CM

## 2021-06-16 NOTE — Progress Notes (Signed)
[facility] [place],  [id number]  Internal MEDICINE  Office Visit Note  Patient Name: Yvette Humphrey  [id number]  [id number]  Date of Service: 06/16/2021  Chief Complaint  Patient presents with   Annual Exam   Hyperlipidemia   Hypertension    HPI Yvette Humphrey presents for an annual well visit and physical exam.  Yvette Humphrey is a well-appearing 45 year old female.  She gets her flu vaccine at work.  She does have irritable bowel syndrome and she works on avoiding her triggers.  She is due for colorectal cancer screening and is interested in doing the Cologuard stool test.  She does not have a family history of colorectal cancer.  She has lost 4 pounds since her previous office visit.  Her last Pap smear was in 2020 it was normal and negative for HPV so she is not due until 2025.  She had her annual screening mammogram in March this year and the recommendation is continued annual screening BI-RADS Category 1 negative.  She previously had lab work done so we will hold off on doing any lab work at this time.  Her blood pressure is within normal limits today    Current Medication: Outpatient Encounter Medications as of 06/16/2021  Medication Sig   ALPRAZolam (XANAX) 0.5 MG tablet TAKE 1 TABLET(0.5 MG) BY MOUTH DAILY AS NEEDED FOR ANXIETY   azelastine (ASTELIN) 0.1 % nasal spray Use 2 sq in each nostril every 6- 8 hrs qd   buPROPion (WELLBUTRIN) 75 MG tablet TAKE 1 TABLET(75 MG) BY MOUTH TWICE DAILY   cetirizine (ZYRTEC) 10 MG tablet Take 10 mg by mouth daily.   cyanocobalamin 500 MCG tablet Take 500 mcg by mouth daily.   fluconazole (DIFLUCAN) 150 MG tablet TAKE 1 TABLET BY MOUTH 1 TIME. REPEAT AGAIN IF SYMPTOMS PERSIST   fluticasone (FLONASE) 50 MCG/ACT nasal spray Place 2 sprays into both nostrils daily.   fluticasone (FLOVENT HFA) 110 MCG/ACT inhaler Inhale 2 puffs into the lungs in the morning and at bedtime.   gabapentin (NEURONTIN) 100 MG capsule TAKE 2 CAPSULES BY  MOUTH IN AM, TAKE 2 CAPSULES BY MOUTH IN AFTERNOON, TAKE 4 CAPSULES BY MOUTH IN PM   Lysine 1000 MG TABS Take by mouth daily as needed.   Magnesium 250 MG TABS Take by mouth daily.   meloxicam (MOBIC) 15 MG tablet Take 1 tablet (15 mg total) by mouth daily.   metoprolol succinate (TOPROL-XL) 50 MG 24 hr tablet Take one tab po qhs for blood pressure   montelukast (SINGULAIR) 10 MG tablet TAKE 1 TABLET(10 MG) BY MOUTH AT BEDTIME   ondansetron (ZOFRAN) 4 MG tablet Take 1 tablet (4 mg total) by mouth every 8 (eight) hours as needed for nausea or vomiting.   POTASSIUM CHLORIDE PO Take 10 mcg by mouth daily.   promethazine-phenylephrine 6.25-5 MG/5ML SYRP Take 5 mLs by mouth every 4 (four) hours as needed for congestion.   rosuvastatin (CRESTOR) 5 MG tablet Take 1 tablet (5 mg total) by mouth daily.   Semaglutide (RYBELSUS) 3 MG TABS Take 3 mg by mouth daily. in am on empty stomach, wait 30 minutes prior to eating, drinking or taking other medications   sertraline (ZOLOFT) 100 MG tablet TAKE 1 TABLET(100 MG) BY MOUTH DAILY   traZODone (DESYREL) 100 MG tablet TAKE 1 TO 2 TABLETS(100 TO 200 MG) BY MOUTH AT BEDTIME   Ubrogepant (UBRELVY) 100 MG TABS Take 100 mg by mouth daily as needed (acute migraine).   [  DISCONTINUED] azithromycin (ZITHROMAX) 250 MG tablet Take one tab a day for 10 days for uri   [DISCONTINUED] benzonatate (TESSALON) 100 MG capsule Take 1 capsule (100 mg total) by mouth 2 (two) times daily as needed for cough. (Patient not taking: Reported on 06/16/2021)   No facility-administered encounter medications on file as of 06/16/2021.    Surgical History: Past Surgical History:  Procedure Laterality Date   CHOLECYSTECTOMY     ENDOMETRIAL ABLATION     approx 2009   ESOPHAGOGASTRODUODENOSCOPY     REDUCTION MAMMAPLASTY Bilateral 2004   TONSILLECTOMY     TUBAL LIGATION     ULNAR NERVE TRANSPOSITION Left 06/08/2016   Procedure: SUBCUTANEOUS ANTERIOR ULNAR NERVE TRANSPOSITION LEFT ELBOW;   Surgeon: Corky Mull, MD;  Location: [place];  Service: Orthopedics;  Laterality: Left;    Medical History: Past Medical History:  Diagnosis Date   Complication of anesthesia    Headache    migraines, 1x/mo   Hyperlipidemia    Hypertension    Motion sickness    back seat cars   PONV (postoperative nausea and vomiting)    Tachycardia    Wears contact lenses     Family History: Family History  Problem Relation Age of Onset   Hypertension Mother    Hyperlipidemia Mother    Hyperlipidemia Maternal Grandfather    COPD Maternal Grandfather    Breast cancer Paternal Grandmother     Social History   Socioeconomic History   Marital status: Married    Spouse name: Not on file   Number of children: Not on file   Years of education: Not on file   Highest education level: Not on file  Occupational History   Not on file  Tobacco Use   Smoking status: Never   Smokeless tobacco: Never  Vaping Use   Vaping Use: Never used  Substance and Sexual Activity   Alcohol use: Yes    Alcohol/week: 1.0 standard drink    Types: 1 Glasses of wine per week    Comment: occasionally   Drug use: No   Sexual activity: Not on file  Other Topics Concern   Not on file  Social History Narrative   Not on file   Social Determinants of Health   Financial Resource Strain: Not on file  Food Insecurity: Not on file  Transportation Needs: Not on file  Physical Activity: Not on file  Stress: Not on file  Social Connections: Not on file  Intimate Partner Violence: Not on file      Review of Systems  Constitutional:  Negative for activity change, appetite change, chills, fatigue, fever and unexpected weight change.  HENT: Negative.  Negative for congestion, ear pain, rhinorrhea, sore throat and trouble swallowing.   Eyes: Negative.   Respiratory: Negative.  Negative for cough, chest tightness, shortness of breath and wheezing.   Cardiovascular: Negative.  Negative for chest pain.   Gastrointestinal: Negative.  Negative for abdominal pain, blood in stool, constipation, diarrhea, nausea and vomiting.  Endocrine: Negative.   Genitourinary: Negative.  Negative for difficulty urinating, dysuria, frequency, hematuria and urgency.  Musculoskeletal: Negative.  Negative for arthralgias, back pain, joint swelling, myalgias and neck pain.  Skin: Negative.  Negative for rash and wound.  Allergic/Immunologic: Negative.  Negative for immunocompromised state.  Neurological: Negative.  Negative for dizziness, seizures, numbness and headaches.  Hematological: Negative.   Psychiatric/Behavioral: Negative.  Negative for behavioral problems, self-injury and suicidal ideas. The patient is not nervous/anxious.  Vital Signs: BP 108/78   Pulse 80   Temp 98.4 F (36.9 C)   Resp 16   Ht 5' 2"  (1.575 m)   Wt 182 lb 9.6 oz (82.8 kg)   SpO2 99%   BMI 33.40 kg/m    Physical Exam Vitals reviewed.  Constitutional:      General: She is awake. She is not in acute distress.    Appearance: Normal appearance. She is well-developed and well-groomed. She is obese. She is not ill-appearing or diaphoretic.  HENT:     Head: Normocephalic and atraumatic.     Right Ear: Tympanic membrane, ear canal and external ear normal.     Left Ear: Tympanic membrane, ear canal and external ear normal.     Nose: Nose normal. No congestion or rhinorrhea.     Mouth/Throat:     Lips: Pink.     Mouth: Mucous membranes are moist.     Pharynx: Oropharynx is clear. Uvula midline. No oropharyngeal exudate or posterior oropharyngeal erythema.  Eyes:     General: Lids are normal. Vision grossly intact. Gaze aligned appropriately. No scleral icterus.       Right eye: No discharge.        Left eye: No discharge.     Extraocular Movements: Extraocular movements intact.     Conjunctiva/sclera: Conjunctivae normal.     Pupils: Pupils are equal, round, and reactive to light.     Funduscopic exam:    Right eye: Red  reflex present.        Left eye: Red reflex present. Neck:     Thyroid: No thyromegaly.     Vascular: No carotid bruit or JVD.     Trachea: Trachea and phonation normal. No tracheal deviation.  Cardiovascular:     Rate and Rhythm: Normal rate and regular rhythm.     Pulses: Normal pulses.     Heart sounds: Normal heart sounds, S1 normal and S2 normal. No murmur heard.   No friction rub. No gallop.  Pulmonary:     Effort: Pulmonary effort is normal. No accessory muscle usage or respiratory distress.     Breath sounds: Normal breath sounds and air entry. No stridor. No wheezing or rales.  Chest:     Chest wall: No tenderness.     Comments: Declined clinical breast exam, gets annual mammograms.  Abdominal:     General: Bowel sounds are normal. There is no distension.     Palpations: Abdomen is soft. There is no shifting dullness, fluid wave, mass or pulsatile mass.     Tenderness: There is no abdominal tenderness. There is no guarding or rebound.  Musculoskeletal:        General: No tenderness or deformity. Normal range of motion.     Cervical back: Normal range of motion and neck supple.  Lymphadenopathy:     Cervical: No cervical adenopathy.  Skin:    General: Skin is warm and dry.     Capillary Refill: Capillary refill takes less than 2 seconds.     Coloration: Skin is not pale.     Findings: No erythema or rash.  Neurological:     Mental Status: She is alert and oriented to person, place, and time.     Cranial Nerves: No cranial nerve deficit.     Motor: No abnormal muscle tone.     Coordination: Coordination normal.     Gait: Gait normal.     Deep Tendon Reflexes: Reflexes are normal and symmetric.  Psychiatric:        Mood and Affect: Mood and affect normal.        Behavior: Behavior normal. Behavior is cooperative.        Thought Content: Thought content normal.        Judgment: Judgment normal.       Assessment/Plan: 1. Encounter for general adult medical  examination with abnormal findings Age-appropriate preventive screenings and vaccinations discussed, annual physical exam completed. PHM updated.   2. Essential (primary) hypertension Stable with current medications  3. Mixed hyperlipidemia Significantly elevated, taking rosuvastatin.  4. Major depressive disorder, recurrent, moderate (HCC) Stable with current medications, no refills needed at this time.   5. Screening for colorectal cancer Cologard order sent - Cologuard  6. Dysuria Routine urinalysis done - UA/M w/rflx Culture, Routine - Microscopic Examination      General Counseling: jameeka marcy understanding of the findings of todays visit and agrees with plan of treatment. I have discussed any further diagnostic evaluation that may be needed or ordered today. We also reviewed her medications today. she has been encouraged to call the office with any questions or concerns that should arise related to todays visit.    Orders Placed This Encounter  Procedures   UA/M w/rflx Culture, Routine   Cologuard    No orders of the defined types were placed in this encounter.   Return in about 3 months (around 09/16/2021) for F/U, Weight loss, Jahaziel Francois PCP, Review labs/test.   Total time spent:30 Minutes Time spent includes review of chart, medications, test results, and follow up plan with the patient.   [place] Controlled Substance Database was reviewed by me.  This patient was seen by Jonetta Osgood, FNP-C in collaboration with Dr. Clayborn Bigness as a part of collaborative care agreement.  Javanni Maring R. Valetta Fuller, MSN, FNP-C Internal medicine

## 2021-06-17 LAB — UA/M W/RFLX CULTURE, ROUTINE
Bilirubin, UA: NEGATIVE
Glucose, UA: NEGATIVE
Ketones, UA: NEGATIVE
Leukocytes,UA: NEGATIVE
Nitrite, UA: NEGATIVE
Protein,UA: NEGATIVE
RBC, UA: NEGATIVE
Specific Gravity, UA: 1.019 (ref 1.005–1.030)
Urobilinogen, Ur: 0.2 mg/dL (ref 0.2–1.0)
pH, UA: 7.5 (ref 5.0–7.5)

## 2021-06-17 LAB — MICROSCOPIC EXAMINATION
Casts: NONE SEEN /lpf
RBC, Urine: NONE SEEN /hpf (ref 0–2)
WBC, UA: NONE SEEN /hpf (ref 0–5)

## 2021-06-24 ENCOUNTER — Other Ambulatory Visit: Payer: Self-pay | Admitting: Internal Medicine

## 2021-06-24 DIAGNOSIS — F331 Major depressive disorder, recurrent, moderate: Secondary | ICD-10-CM

## 2021-06-24 DIAGNOSIS — F411 Generalized anxiety disorder: Secondary | ICD-10-CM

## 2021-06-25 NOTE — Telephone Encounter (Signed)
meds sent

## 2021-06-25 NOTE — Telephone Encounter (Signed)
Last appt 06/16/21 and next feb please review and send

## 2021-06-29 ENCOUNTER — Other Ambulatory Visit: Payer: Self-pay | Admitting: Internal Medicine

## 2021-06-29 DIAGNOSIS — F5101 Primary insomnia: Secondary | ICD-10-CM

## 2021-07-05 ENCOUNTER — Encounter: Payer: Self-pay | Admitting: Nurse Practitioner

## 2021-07-06 ENCOUNTER — Other Ambulatory Visit: Payer: Self-pay | Admitting: Nurse Practitioner

## 2021-07-06 DIAGNOSIS — R197 Diarrhea, unspecified: Secondary | ICD-10-CM

## 2021-07-06 MED ORDER — DIPHENOXYLATE-ATROPINE 2.5-0.025 MG PO TABS: 1.0000 | ORAL_TABLET | Freq: Four times a day (QID) | ORAL | 0 refills | Status: DC | PRN

## 2021-07-07 LAB — COLOGUARD: COLOGUARD: NEGATIVE

## 2021-07-14 ENCOUNTER — Other Ambulatory Visit: Payer: Self-pay | Admitting: Orthopaedic Surgery

## 2021-07-14 ENCOUNTER — Encounter: Payer: Self-pay | Admitting: Nurse Practitioner

## 2021-07-14 DIAGNOSIS — M79602 Pain in left arm: Secondary | ICD-10-CM

## 2021-07-19 ENCOUNTER — Ambulatory Visit: Payer: BC Managed Care – PPO

## 2021-07-19 ENCOUNTER — Other Ambulatory Visit: Payer: Self-pay

## 2021-07-19 ENCOUNTER — Ambulatory Visit
Admission: RE | Admit: 2021-07-19 | Discharge: 2021-07-19 | Disposition: A | Discharge status: Home / Self Care | Payer: BC Managed Care – PPO | Source: Ambulatory Visit | Attending: Orthopaedic Surgery | Admitting: Orthopaedic Surgery

## 2021-07-19 DIAGNOSIS — M79602 Pain in left arm: Secondary | ICD-10-CM

## 2021-07-22 ENCOUNTER — Other Ambulatory Visit: Payer: Self-pay | Admitting: Internal Medicine

## 2021-07-22 DIAGNOSIS — I10 Essential (primary) hypertension: Secondary | ICD-10-CM

## 2021-08-07 ENCOUNTER — Other Ambulatory Visit: Payer: Self-pay | Admitting: Internal Medicine

## 2021-08-07 DIAGNOSIS — F411 Generalized anxiety disorder: Secondary | ICD-10-CM

## 2021-08-09 ENCOUNTER — Other Ambulatory Visit: Payer: Self-pay | Admitting: Nurse Practitioner

## 2021-08-09 DIAGNOSIS — R112 Nausea with vomiting, unspecified: Secondary | ICD-10-CM

## 2021-08-17 ENCOUNTER — Encounter (HOSPITAL_COMMUNITY): Payer: Self-pay | Admitting: Radiology

## 2021-08-24 ENCOUNTER — Telehealth (INDEPENDENT_AMBULATORY_CARE_PROVIDER_SITE_OTHER): Payer: BC Managed Care – PPO | Admitting: Nurse Practitioner

## 2021-08-24 ENCOUNTER — Encounter: Payer: Self-pay | Admitting: Nurse Practitioner

## 2021-08-24 VITALS — Ht 63.0 in | Wt 183.0 lb

## 2021-08-24 DIAGNOSIS — R051 Acute cough: Secondary | ICD-10-CM | POA: Diagnosis not present

## 2021-08-24 DIAGNOSIS — J0111 Acute recurrent frontal sinusitis: Secondary | ICD-10-CM

## 2021-08-24 DIAGNOSIS — R0981 Nasal congestion: Secondary | ICD-10-CM | POA: Diagnosis not present

## 2021-08-24 MED ORDER — AZITHROMYCIN 250 MG PO TABS: ORAL_TABLET | ORAL | 0 refills | Status: DC

## 2021-08-24 MED ORDER — PROMETHAZINE-PHENYLEPHRINE 6.25-5 MG/5ML PO SYRP: 5.0000 mL | ORAL_SOLUTION | ORAL | 0 refills | Status: DC | PRN

## 2021-08-24 NOTE — Progress Notes (Signed)
[facility] [place], [place] [address]  Internal MEDICINE  Telephone Visit  Patient Name: Yvette Humphrey  [id number]  [id number]  Date of Service: 08/24/2021  I connected with the patient at 1:35 PM by telephone and verified the patients identity using two identifiers.   I discussed the limitations, risks, security and privacy concerns of performing an evaluation and management service by telephone and the availability of in person appointments. I also discussed with the patient that there may be a patient responsible charge related to the service.  The patient expressed understanding and agrees to proceed.    Chief Complaint  Patient presents with   Telephone Assessment    [id number]    Telephone Screen    Covid is negative   Sinusitis   Cough   Headache    HPI Yvette Humphrey presents for a telehealth virtual visit for symptoms of sinus infection. She reports fever, chills, nasal congestion, cough and headache. She tested negative for covid.      Current Medication: Outpatient Encounter Medications as of 08/24/2021  Medication Sig   ALPRAZolam (XANAX) 0.5 MG tablet TAKE 1 TABLET(0.5 MG) BY MOUTH DAILY AS NEEDED FOR ANXIETY   azelastine (ASTELIN) 0.1 % nasal spray Use 2 sq in each nostril every 6- 8 hrs qd   azithromycin (ZITHROMAX) 250 MG tablet Take one tab a day for 10 days for uri   buPROPion (WELLBUTRIN) 75 MG tablet TAKE 1 TABLET(75 MG) BY MOUTH TWICE DAILY   cetirizine (ZYRTEC) 10 MG tablet Take 10 mg by mouth daily.   cyanocobalamin 500 MCG tablet Take 500 mcg by mouth daily.   diphenoxylate-atropine (LOMOTIL) 2.5-0.025 MG tablet Take 1 tablet by mouth 4 (four) times daily as needed for diarrhea or loose stools.   fluconazole (DIFLUCAN) 150 MG tablet TAKE 1 TABLET BY MOUTH 1 TIME. REPEAT AGAIN IF SYMPTOMS PERSIST   fluticasone (FLONASE) 50 MCG/ACT nasal spray Place 2 sprays into both nostrils daily.   fluticasone (FLOVENT HFA) 110 MCG/ACT inhaler  Inhale 2 puffs into the lungs in the morning and at bedtime.   gabapentin (NEURONTIN) 100 MG capsule TAKE 2 CAPSULES BY MOUTH IN AM, TAKE 2 CAPSULES BY MOUTH IN AFTERNOON, TAKE 4 CAPSULES BY MOUTH IN PM   Lysine 1000 MG TABS Take by mouth daily as needed.   Magnesium 250 MG TABS Take by mouth daily.   meloxicam (MOBIC) 15 MG tablet Take 1 tablet (15 mg total) by mouth daily.   metoprolol succinate (TOPROL-XL) 50 MG 24 hr tablet TAKE 1 TABLET BY MOUTH EVERY NIGHT AT BEDTIME FOR BLOOD PRESSURE   montelukast (SINGULAIR) 10 MG tablet TAKE 1 TABLET(10 MG) BY MOUTH AT BEDTIME   ondansetron (ZOFRAN) 4 MG tablet TAKE 1 TABLET(4 MG) BY MOUTH EVERY 8 HOURS AS NEEDED FOR NAUSEA OR VOMITING   POTASSIUM CHLORIDE PO Take 10 mcg by mouth daily.   rosuvastatin (CRESTOR) 5 MG tablet Take 1 tablet (5 mg total) by mouth daily.   Semaglutide (RYBELSUS) 3 MG TABS Take 3 mg by mouth daily. in am on empty stomach, wait 30 minutes prior to eating, drinking or taking other medications   sertraline (ZOLOFT) 100 MG tablet TAKE 1 TABLET(100 MG) BY MOUTH DAILY   traZODone (DESYREL) 100 MG tablet TAKE 1 TO 2 TABLETS(100 TO 200 MG) BY MOUTH AT BEDTIME   Ubrogepant (UBRELVY) 100 MG TABS Take 100 mg by mouth daily as needed (acute migraine).   [DISCONTINUED] promethazine-phenylephrine 6.25-5 MG/5ML SYRP Take 5 mLs  by mouth every 4 (four) hours as needed for congestion.   promethazine-phenylephrine 6.25-5 MG/5ML SYRP Take 5 mLs by mouth every 4 (four) hours as needed for congestion.   No facility-administered encounter medications on file as of 08/24/2021.    Surgical History: Past Surgical History:  Procedure Laterality Date   CHOLECYSTECTOMY     ENDOMETRIAL ABLATION     approx 2009   ESOPHAGOGASTRODUODENOSCOPY     REDUCTION MAMMAPLASTY Bilateral 2004   TONSILLECTOMY     TUBAL LIGATION     ULNAR NERVE TRANSPOSITION Left 06/08/2016   Procedure: SUBCUTANEOUS ANTERIOR ULNAR NERVE TRANSPOSITION LEFT ELBOW;  Surgeon: Corky Mull, MD;  Location: [place];  Service: Orthopedics;  Laterality: Left;    Medical History: Past Medical History:  Diagnosis Date   Complication of anesthesia    Headache    migraines, 1x/mo   Hyperlipidemia    Hypertension    Motion sickness    back seat cars   PONV (postoperative nausea and vomiting)    Tachycardia    Wears contact lenses     Family History: Family History  Problem Relation Age of Onset   Hypertension Mother    Hyperlipidemia Mother    Hyperlipidemia Maternal Grandfather    COPD Maternal Grandfather    Breast cancer Paternal Grandmother     Social History   Socioeconomic History   Marital status: Married    Spouse name: Not on file   Number of children: Not on file   Years of education: Not on file   Highest education level: Not on file  Occupational History   Not on file  Tobacco Use   Smoking status: Never   Smokeless tobacco: Never  Vaping Use   Vaping Use: Never used  Substance and Sexual Activity   Alcohol use: Yes    Alcohol/week: 1.0 standard drink    Types: 1 Glasses of wine per week    Comment: occasionally   Drug use: No   Sexual activity: Not on file  Other Topics Concern   Not on file  Social History Narrative   Not on file   Social Determinants of Health   Financial Resource Strain: Not on file  Food Insecurity: Not on file  Transportation Needs: Not on file  Physical Activity: Not on file  Stress: Not on file  Social Connections: Not on file  Intimate Partner Violence: Not on file      Review of Systems  Constitutional:  Positive for fatigue. Negative for chills and fever.  HENT:  Positive for congestion, postnasal drip, rhinorrhea, sinus pressure, sinus pain, sneezing and sore throat.   Respiratory:  Positive for cough. Negative for chest tightness, shortness of breath and wheezing.   Cardiovascular: Negative.  Negative for chest pain and palpitations.  Gastrointestinal: Negative.  Negative for  abdominal pain, constipation, diarrhea, nausea and vomiting.  Musculoskeletal:  Positive for myalgias.  Skin: Negative.  Negative for rash.  Neurological:  Positive for headaches.   Vital Signs: Ht 5' 3"  (1.6 m)    Wt 183 lb (83 kg)    BMI 32.42 kg/m    Observation/Objective: She is alert and oriented and engages in conversation appropriately. She does not appear to be in any acute distress over video call.     Assessment/Plan: 1. Acute recurrent frontal sinusitis Empiric antibiotic treatment prescribed - azithromycin (ZITHROMAX) 250 MG tablet; Take one tab a day for 10 days for uri  Dispense: 10 tablet; Refill: 0  2. Acute  cough Medication prescribed for symptomatic relief of cough - promethazine-phenylephrine 6.25-5 MG/5ML SYRP; Take 5 mLs by mouth every 4 (four) hours as needed for congestion.  Dispense: 180 mL; Refill: 0  3. Nasal congestion Medication prescribed for symptomatic relief - promethazine-phenylephrine 6.25-5 MG/5ML SYRP; Take 5 mLs by mouth every 4 (four) hours as needed for congestion.  Dispense: 180 mL; Refill: 0   General Counseling: Yvette Humphrey understanding of the findings of today's phone visit and agrees with plan of treatment. I have discussed any further diagnostic evaluation that may be needed or ordered today. We also reviewed her medications today. she has been encouraged to call the office with any questions or concerns that should arise related to todays visit.  Return if symptoms worsen or fail to improve.   No orders of the defined types were placed in this encounter.   Meds ordered this encounter  Medications   azithromycin (ZITHROMAX) 250 MG tablet    Sig: Take one tab a day for 10 days for uri    Dispense:  10 tablet    Refill:  0   promethazine-phenylephrine 6.25-5 MG/5ML SYRP    Sig: Take 5 mLs by mouth every 4 (four) hours as needed for congestion.    Dispense:  180 mL    Refill:  0    Time spent:10 Minutes Time spent with  patient included reviewing progress notes, labs, imaging studies, and discussing plan for follow up.  [place] Controlled Substance Database was reviewed by me for overdose risk score (ORS) if appropriate.  This patient was seen by Jonetta Osgood, FNP-C in collaboration with Dr. Clayborn Bigness as a part of collaborative care agreement.  Yvette Cleckley R. Valetta Fuller, MSN, FNP-C Internal medicine

## 2021-08-25 ENCOUNTER — Ambulatory Visit: Payer: BC Managed Care – PPO

## 2021-08-25 DIAGNOSIS — J029 Acute pharyngitis, unspecified: Secondary | ICD-10-CM

## 2021-08-25 DIAGNOSIS — R6889 Other general symptoms and signs: Secondary | ICD-10-CM

## 2021-08-25 DIAGNOSIS — R059 Cough, unspecified: Secondary | ICD-10-CM

## 2021-08-25 LAB — POCT INFLUENZA A/B
Influenza A, POC: NEGATIVE
Influenza B, POC: NEGATIVE

## 2021-08-25 LAB — POCT RAPID STREP A (OFFICE): Rapid Strep A Screen: NEGATIVE

## 2021-08-25 NOTE — Progress Notes (Signed)
Yvette Humphrey spoke to pt, informed pt of results. Told pt to drink lots of fluids and get rest, informed her she can get covid PCR in a day or two if she is not getting any better.

## 2021-08-25 NOTE — Progress Notes (Signed)
Spoke with pt her flu test and strep are negative continue antibiotic and drink plenty of fluids rest and take tylenol for fever and use inhaler if she not feeling better by tomorrow go for PCR test for covid and also send [place] mychart message that how she is feeling

## 2021-08-26 ENCOUNTER — Telehealth: Payer: Self-pay

## 2021-08-26 NOTE — Telephone Encounter (Signed)
Spoke with pt she was feeling much better no more fever still congested advised used sinus rinse and flonase as needed

## 2021-09-11 ENCOUNTER — Encounter: Payer: Self-pay | Admitting: Nurse Practitioner

## 2021-09-13 ENCOUNTER — Other Ambulatory Visit: Payer: Self-pay | Admitting: Nurse Practitioner

## 2021-09-13 DIAGNOSIS — E039 Hypothyroidism, unspecified: Secondary | ICD-10-CM

## 2021-09-13 DIAGNOSIS — E782 Mixed hyperlipidemia: Secondary | ICD-10-CM

## 2021-09-13 DIAGNOSIS — E559 Vitamin D deficiency, unspecified: Secondary | ICD-10-CM

## 2021-09-13 DIAGNOSIS — R7989 Other specified abnormal findings of blood chemistry: Secondary | ICD-10-CM

## 2021-09-13 DIAGNOSIS — E538 Deficiency of other specified B group vitamins: Secondary | ICD-10-CM

## 2021-09-15 ENCOUNTER — Other Ambulatory Visit: Payer: Self-pay

## 2021-09-15 ENCOUNTER — Telehealth: Payer: Self-pay

## 2021-09-15 ENCOUNTER — Encounter: Payer: Self-pay | Admitting: Nurse Practitioner

## 2021-09-15 ENCOUNTER — Ambulatory Visit: Payer: BC Managed Care – PPO | Admitting: Nurse Practitioner

## 2021-09-15 VITALS — BP 116/76 | HR 85 | Temp 98.6°F | Resp 16 | Ht 63.0 in | Wt 186.2 lb

## 2021-09-15 DIAGNOSIS — R7301 Impaired fasting glucose: Secondary | ICD-10-CM | POA: Diagnosis not present

## 2021-09-15 DIAGNOSIS — G8929 Other chronic pain: Secondary | ICD-10-CM

## 2021-09-15 DIAGNOSIS — F411 Generalized anxiety disorder: Secondary | ICD-10-CM

## 2021-09-15 DIAGNOSIS — E6609 Other obesity due to excess calories: Secondary | ICD-10-CM

## 2021-09-15 DIAGNOSIS — E559 Vitamin D deficiency, unspecified: Secondary | ICD-10-CM | POA: Diagnosis not present

## 2021-09-15 DIAGNOSIS — M25552 Pain in left hip: Secondary | ICD-10-CM

## 2021-09-15 DIAGNOSIS — Z6833 Body mass index (BMI) 33.0-33.9, adult: Secondary | ICD-10-CM

## 2021-09-15 DIAGNOSIS — F331 Major depressive disorder, recurrent, moderate: Secondary | ICD-10-CM

## 2021-09-15 DIAGNOSIS — R7989 Other specified abnormal findings of blood chemistry: Secondary | ICD-10-CM

## 2021-09-15 LAB — CBC WITH DIFFERENTIAL/PLATELET
Basophils Absolute: 0 10*3/uL (ref 0.0–0.2)
Basos: 1 %
EOS (ABSOLUTE): 0.2 10*3/uL (ref 0.0–0.4)
Eos: 2 %
Hematocrit: 38.8 % (ref 34.0–46.6)
Hemoglobin: 13.2 g/dL (ref 11.1–15.9)
Immature Grans (Abs): 0 10*3/uL (ref 0.0–0.1)
Immature Granulocytes: 1 %
Lymphocytes Absolute: 2.4 10*3/uL (ref 0.7–3.1)
Lymphs: 33 %
MCH: 31.9 pg (ref 26.6–33.0)
MCHC: 34 g/dL (ref 31.5–35.7)
MCV: 94 fL (ref 79–97)
Monocytes Absolute: 0.5 10*3/uL (ref 0.1–0.9)
Monocytes: 6 %
Neutrophils Absolute: 4 10*3/uL (ref 1.4–7.0)
Neutrophils: 57 %
Platelets: 279 10*3/uL (ref 150–450)
RBC: 4.14 x10E6/uL (ref 3.77–5.28)
RDW: 11.8 % (ref 11.7–15.4)
WBC: 7.1 10*3/uL (ref 3.4–10.8)

## 2021-09-15 LAB — LIPID PANEL
Chol/HDL Ratio: 3.8 ratio (ref 0.0–4.4)
Cholesterol, Total: 164 mg/dL (ref 100–199)
HDL: 43 mg/dL (ref >39)
LDL Chol Calc (NIH): 81 mg/dL (ref 0–99)
Triglycerides: 239 mg/dL — ABNORMAL HIGH (ref 0–149)
VLDL Cholesterol Cal: 40 mg/dL (ref 5–40)

## 2021-09-15 LAB — CMP14+EGFR
ALT: 18 IU/L (ref 0–32)
AST: 19 IU/L (ref 0–40)
Albumin/Globulin Ratio: 2.3 — ABNORMAL HIGH (ref 1.2–2.2)
Albumin: 4.9 g/dL — ABNORMAL HIGH (ref 3.8–4.8)
Alkaline Phosphatase: 57 IU/L (ref 44–121)
BUN/Creatinine Ratio: 23 (ref 9–23)
BUN: 16 mg/dL (ref 6–24)
Bilirubin Total: 0.2 mg/dL (ref 0.0–1.2)
CO2: 25 mmol/L (ref 20–29)
Calcium: 9.7 mg/dL (ref 8.7–10.2)
Chloride: 101 mmol/L (ref 96–106)
Creatinine, Ser: 0.71 mg/dL (ref 0.57–1.00)
Globulin, Total: 2.1 g/dL (ref 1.5–4.5)
Glucose: 92 mg/dL (ref 70–99)
Potassium: 4.7 mmol/L (ref 3.5–5.2)
Sodium: 139 mmol/L (ref 134–144)
Total Protein: 7 g/dL (ref 6.0–8.5)
eGFR: 105 mL/min/{1.73_m2} (ref >59)

## 2021-09-15 LAB — TSH+FREE T4
Free T4: 1.24 ng/dL (ref 0.82–1.77)
TSH: 1.64 u[IU]/mL (ref 0.450–4.500)

## 2021-09-15 LAB — IRON,TIBC AND FERRITIN PANEL
Ferritin: 320 ng/mL — ABNORMAL HIGH (ref 15–150)
Iron Saturation: 33 % (ref 15–55)
Iron: 90 ug/dL (ref 27–159)
Total Iron Binding Capacity: 276 ug/dL (ref 250–450)
UIBC: 186 ug/dL (ref 131–425)

## 2021-09-15 LAB — B12 AND FOLATE PANEL
Folate: >20 ng/mL (ref >3.0)
Vitamin B-12: 587 pg/mL (ref 232–1245)

## 2021-09-15 LAB — VITAMIN D 25 HYDROXY (VIT D DEFICIENCY, FRACTURES): Vit D, 25-Hydroxy: 31.3 ng/mL (ref 30.0–100.0)

## 2021-09-15 MED ORDER — TRAMADOL HCL 50 MG PO TABS: 50.0000 mg | ORAL_TABLET | Freq: Four times a day (QID) | ORAL | 0 refills | Status: DC | PRN

## 2021-09-15 MED ORDER — RYBELSUS 3 MG PO TABS: 3.0000 mg | ORAL_TABLET | Freq: Every day | ORAL | 5 refills | Status: DC

## 2021-09-15 MED ORDER — BUPROPION HCL 75 MG PO TABS: 75.0000 mg | ORAL_TABLET | Freq: Two times a day (BID) | ORAL | 1 refills | Status: DC

## 2021-09-15 MED ORDER — ALPRAZOLAM 0.5 MG PO TABS: ORAL_TABLET | ORAL | 2 refills | Status: DC

## 2021-09-15 NOTE — Progress Notes (Signed)
[facility] [place], [place] [address]  Internal MEDICINE  Office Visit Note  Patient Name: Yvette Humphrey  [id number]  [id number]  Date of Service: 09/15/2021  Chief Complaint  Patient presents with   Follow-up   Weight Loss   Results   Hyperlipidemia   Hypertension    HPI Shenique presents for a follow up visit for weight loss management and to review labs from annual physical exam. She has gained 3 lbs since November but this was through the holidays. She continues to take rybelsus for weight loss.  Labs reviewed with patient. Her ferritin level is chronically elevated in the 300s. She has already seen a hematologist in the past for this issue. Will continue to monitor.  Her metabolic panel is normal except for a slightly elevated albumin, will monitor periodically.  Her thyroid levels, CBC, B12, and folate are normal. Her vitamin D level is low normal. Her cholesterol levels are normal except for an elevated triglyceride level of 239. Her lipid panel has significantly improved compared to early 2022.  She has heart problems and high cholesterol that runs in her family.  Has chronic left hip pain, has had xrays done in the past and was previously told she has or may have bursitis. This problem has been bothering her more and more recently. Hurts when walking or sitting.     Current Medication: Outpatient Encounter Medications as of 09/15/2021  Medication Sig   azelastine (ASTELIN) 0.1 % nasal spray Use 2 sq in each nostril every 6- 8 hrs qd   cetirizine (ZYRTEC) 10 MG tablet Take 10 mg by mouth daily.   cyanocobalamin 500 MCG tablet Take 500 mcg by mouth daily.   diphenoxylate-atropine (LOMOTIL) 2.5-0.025 MG tablet Take 1 tablet by mouth 4 (four) times daily as needed for diarrhea or loose stools.   fluconazole (DIFLUCAN) 150 MG tablet TAKE 1 TABLET BY MOUTH 1 TIME. REPEAT AGAIN IF SYMPTOMS PERSIST   fluticasone (FLONASE) 50 MCG/ACT nasal spray Place 2  sprays into both nostrils daily.   fluticasone (FLOVENT HFA) 110 MCG/ACT inhaler Inhale 2 puffs into the lungs in the morning and at bedtime.   gabapentin (NEURONTIN) 100 MG capsule TAKE 2 CAPSULES BY MOUTH IN AM, TAKE 2 CAPSULES BY MOUTH IN AFTERNOON, TAKE 4 CAPSULES BY MOUTH IN PM   Lysine 1000 MG TABS Take by mouth daily as needed.   Magnesium 250 MG TABS Take by mouth daily.   meloxicam (MOBIC) 15 MG tablet Take 1 tablet (15 mg total) by mouth daily.   metoprolol succinate (TOPROL-XL) 50 MG 24 hr tablet TAKE 1 TABLET BY MOUTH EVERY NIGHT AT BEDTIME FOR BLOOD PRESSURE   montelukast (SINGULAIR) 10 MG tablet TAKE 1 TABLET(10 MG) BY MOUTH AT BEDTIME   ondansetron (ZOFRAN) 4 MG tablet TAKE 1 TABLET(4 MG) BY MOUTH EVERY 8 HOURS AS NEEDED FOR NAUSEA OR VOMITING   POTASSIUM CHLORIDE PO Take 10 mcg by mouth daily.   promethazine-phenylephrine 6.25-5 MG/5ML SYRP Take 5 mLs by mouth every 4 (four) hours as needed for congestion.   rosuvastatin (CRESTOR) 5 MG tablet Take 1 tablet (5 mg total) by mouth daily.   sertraline (ZOLOFT) 100 MG tablet TAKE 1 TABLET(100 MG) BY MOUTH DAILY   traMADol (ULTRAM) 50 MG tablet Take 1 tablet (50 mg total) by mouth every 6 (six) hours as needed for up to 5 days for severe pain.   traZODone (DESYREL) 100 MG tablet TAKE 1 TO 2 TABLETS(100 TO 200  MG) BY MOUTH AT BEDTIME   Ubrogepant (UBRELVY) 100 MG TABS Take 100 mg by mouth daily as needed (acute migraine).   [DISCONTINUED] ALPRAZolam (XANAX) 0.5 MG tablet TAKE 1 TABLET(0.5 MG) BY MOUTH DAILY AS NEEDED FOR ANXIETY   [DISCONTINUED] azithromycin (ZITHROMAX) 250 MG tablet Take one tab a day for 10 days for uri   [DISCONTINUED] buPROPion (WELLBUTRIN) 75 MG tablet TAKE 1 TABLET(75 MG) BY MOUTH TWICE DAILY   [DISCONTINUED] Semaglutide (RYBELSUS) 3 MG TABS Take 3 mg by mouth daily. in am on empty stomach, wait 30 minutes prior to eating, drinking or taking other medications   ALPRAZolam (XANAX) 0.5 MG tablet TAKE 1 TABLET(0.5 MG)  BY MOUTH DAILY AS NEEDED FOR ANXIETY   buPROPion (WELLBUTRIN) 75 MG tablet Take 1 tablet (75 mg total) by mouth 2 (two) times daily.   Semaglutide (RYBELSUS) 3 MG TABS Take 3 mg by mouth daily. in am on empty stomach, wait 30 minutes prior to eating, drinking or taking other medications   No facility-administered encounter medications on file as of 09/15/2021.    Surgical History: Past Surgical History:  Procedure Laterality Date   CHOLECYSTECTOMY     ENDOMETRIAL ABLATION     approx 2009   ESOPHAGOGASTRODUODENOSCOPY     REDUCTION MAMMAPLASTY Bilateral 2004   TONSILLECTOMY     TUBAL LIGATION     ULNAR NERVE TRANSPOSITION Left 06/08/2016   Procedure: SUBCUTANEOUS ANTERIOR ULNAR NERVE TRANSPOSITION LEFT ELBOW;  Surgeon: Corky Mull, MD;  Location: [facility];  Service: Orthopedics;  Laterality: Left;    Medical History: Past Medical History:  Diagnosis Date   Complication of anesthesia    Headache    migraines, 1x/mo   Hyperlipidemia    Hypertension    Motion sickness    back seat cars   PONV (postoperative nausea and vomiting)    Tachycardia    Wears contact lenses     Family History: Family History  Problem Relation Age of Onset   Hypertension Mother    Hyperlipidemia Mother    Hyperlipidemia Maternal Grandfather    COPD Maternal Grandfather    Breast cancer Paternal Grandmother     Social History   Socioeconomic History   Marital status: Married    Spouse name: Not on file   Number of children: Not on file   Years of education: Not on file   Highest education level: Not on file  Occupational History   Not on file  Tobacco Use   Smoking status: Never   Smokeless tobacco: Never  Vaping Use   Vaping Use: Never used  Substance and Sexual Activity   Alcohol use: Yes    Alcohol/week: 1.0 standard drink    Types: 1 Glasses of wine per week    Comment: occasionally   Drug use: No   Sexual activity: Not on file  Other Topics Concern   Not on file   Social History Narrative   Not on file   Social Determinants of Health   Financial Resource Strain: Not on file  Food Insecurity: Not on file  Transportation Needs: Not on file  Physical Activity: Not on file  Stress: Not on file  Social Connections: Not on file  Intimate Partner Violence: Not on file      Review of Systems  Constitutional:  Negative for chills, fatigue and unexpected weight change.  HENT:  Positive for postnasal drip. Negative for congestion, rhinorrhea, sinus pressure, sinus pain, sneezing and sore throat.   Eyes:  Negative for redness.  Respiratory:  Negative for cough, chest tightness and shortness of breath.   Cardiovascular: Negative.  Negative for chest pain and palpitations.  Gastrointestinal:  Negative for abdominal pain, constipation, diarrhea, nausea and vomiting.  Genitourinary:  Negative for dysuria and frequency.  Musculoskeletal:  Positive for arthralgias (left hip). Negative for back pain, joint swelling and neck pain.  Skin:  Negative for rash.  Neurological: Negative.  Negative for tremors and numbness.  Hematological:  Negative for adenopathy. Does not bruise/bleed easily.  Psychiatric/Behavioral:  Negative for behavioral problems (Depression), self-injury, sleep disturbance and suicidal ideas. The patient is not nervous/anxious.    Vital Signs: BP 116/76    Pulse 85    Temp 98.6 F (37 C)    Resp 16    Ht 5' 3"  (1.6 m)    Wt 186 lb 3.2 oz (84.5 kg)    SpO2 98%    BMI 32.98 kg/m    Physical Exam Vitals reviewed.  Constitutional:      General: She is not in acute distress.    Appearance: Normal appearance. She is obese. She is not ill-appearing.  HENT:     Head: Normocephalic and atraumatic.  Eyes:     Pupils: Pupils are equal, round, and reactive to light.  Cardiovascular:     Rate and Rhythm: Normal rate and regular rhythm.  Pulmonary:     Effort: Pulmonary effort is normal. No respiratory distress.  Neurological:     Mental  Status: She is alert and oriented to person, place, and time.     Cranial Nerves: No cranial nerve deficit.     Coordination: Coordination normal.     Gait: Gait normal.  Psychiatric:        Mood and Affect: Mood normal.        Behavior: Behavior normal.       Assessment/Plan: 1. Chronic left hip pain May take tramadol every 6 hours as needed for severe pain. May take OTC ibuprofen or acetaminophen as directed on packaging for mild to moderate pain. May use ice or heat for pain relief. Refer to ortho for further evaluation of left hip pain.  - Ambulatory referral to Orthopedic Surgery - traMADol (ULTRAM) 50 MG tablet; Take 1 tablet (50 mg total) by mouth every 6 (six) hours as needed for up to 5 days for severe pain.  Dispense: 20 tablet; Refill: 0  2. Impaired fasting glucose Continue rybelsus.  - Semaglutide (RYBELSUS) 3 MG TABS; Take 3 mg by mouth daily. in am on empty stomach, wait 30 minutes prior to eating, drinking or taking other medications  Dispense: 30 tablet; Refill: 5  3. Vitamin D deficiency Level is low normal, discussed continue OTC supplement to keep level up, patient is agreeable.   4. Elevated ferritin level No intervention needed, will monitor periodically/annually.   5. GAD (generalized anxiety disorder) Stable, refills ordered.  - ALPRAZolam (XANAX) 0.5 MG tablet; TAKE 1 TABLET(0.5 MG) BY MOUTH DAILY AS NEEDED FOR ANXIETY  Dispense: 30 tablet; Refill: 2  6. Major depressive disorder, recurrent, moderate (HCC) Stable, continue medication as prescribed, refills ordered - buPROPion (WELLBUTRIN) 75 MG tablet; Take 1 tablet (75 mg total) by mouth 2 (two) times daily.  Dispense: 180 tablet; Refill: 1  7. Class 1 obesity due to excess calories without serious comorbidity with body mass index (BMI) of 33.0 to 33.9 in adult Continue to work on weight loss, will consider increasing the dose at her next office visit if no  significant weight loss noted.  - Semaglutide  (RYBELSUS) 3 MG TABS; Take 3 mg by mouth daily. in am on empty stomach, wait 30 minutes prior to eating, drinking or taking other medications  Dispense: 30 tablet; Refill: 5   General Counseling: shavy beachem understanding of the findings of todays visit and agrees with plan of treatment. I have discussed any further diagnostic evaluation that may be needed or ordered today. We also reviewed her medications today. she has been encouraged to call the office with any questions or concerns that should arise related to todays visit.    Orders Placed This Encounter  Procedures   Ambulatory referral to Orthopedic Surgery    Meds ordered this encounter  Medications   Semaglutide (RYBELSUS) 3 MG TABS    Sig: Take 3 mg by mouth daily. in am on empty stomach, wait 30 minutes prior to eating, drinking or taking other medications    Dispense:  30 tablet    Refill:  5   ALPRAZolam (XANAX) 0.5 MG tablet    Sig: TAKE 1 TABLET(0.5 MG) BY MOUTH DAILY AS NEEDED FOR ANXIETY    Dispense:  30 tablet    Refill:  2   buPROPion (WELLBUTRIN) 75 MG tablet    Sig: Take 1 tablet (75 mg total) by mouth 2 (two) times daily.    Dispense:  180 tablet    Refill:  1   traMADol (ULTRAM) 50 MG tablet    Sig: Take 1 tablet (50 mg total) by mouth every 6 (six) hours as needed for up to 5 days for severe pain.    Dispense:  20 tablet    Refill:  0    Return in about 3 months (around 12/13/2021) for F/U, Review labs/test, Toni Demo PCP.   Total time spent:30 Minutes Time spent includes review of chart, medications, test results, and follow up plan with the patient.   [place] Controlled Substance Database was reviewed by me.  This patient was seen by Jonetta Osgood, FNP-C in collaboration with Dr. Clayborn Bigness as a part of collaborative care agreement.   Arthurine Oleary R. Valetta Fuller, MSN, FNP-C Internal medicine

## 2021-09-15 NOTE — Telephone Encounter (Signed)
Ortho surgery referral sent via proficient-Toni

## 2021-09-16 ENCOUNTER — Other Ambulatory Visit: Payer: Self-pay

## 2021-09-16 ENCOUNTER — Encounter: Payer: Self-pay | Admitting: Nurse Practitioner

## 2021-09-16 MED ORDER — TRIAMCINOLONE ACETONIDE 0.1 % EX CREA: TOPICAL_CREAM | CUTANEOUS | 1 refills | Status: DC

## 2021-09-16 MED ORDER — PREDNISONE 10 MG PO TABS: ORAL_TABLET | ORAL | 0 refills | Status: DC

## 2021-09-16 NOTE — Telephone Encounter (Signed)
Appointment> 09/20/21 @ 2:00-Toni

## 2021-09-16 NOTE — Telephone Encounter (Signed)
pt called and advised she has developed an allergic reaction to something and its itching so bad.  The bumps are on both arms, on the side/top and both legs at the crease (in the back) and above my nose.   I've tried calamine lotion and benadryl cream, neither one is helping the itch.  she thinks it may be poison oak bc it is red and has blisters.  Per Alyssa we sent in triamcinolone cream and a prednisone taper.  Told pt if no better we need to see her

## 2021-10-05 ENCOUNTER — Other Ambulatory Visit: Payer: Self-pay | Admitting: Internal Medicine

## 2021-10-07 ENCOUNTER — Other Ambulatory Visit: Payer: Self-pay | Admitting: Nurse Practitioner

## 2021-10-07 DIAGNOSIS — Z1231 Encounter for screening mammogram for malignant neoplasm of breast: Secondary | ICD-10-CM

## 2021-10-21 ENCOUNTER — Other Ambulatory Visit: Payer: Self-pay

## 2021-10-21 ENCOUNTER — Encounter: Payer: Self-pay | Admitting: Nurse Practitioner

## 2021-10-21 MED ORDER — PREDNISONE 10 MG PO TABS: ORAL_TABLET | ORAL | 0 refills | Status: DC

## 2021-10-21 NOTE — Telephone Encounter (Signed)
see

## 2021-10-25 ENCOUNTER — Other Ambulatory Visit: Payer: Self-pay | Admitting: Nurse Practitioner

## 2021-10-25 ENCOUNTER — Other Ambulatory Visit: Payer: Self-pay | Admitting: Internal Medicine

## 2021-10-25 DIAGNOSIS — G8929 Other chronic pain: Secondary | ICD-10-CM

## 2021-10-25 DIAGNOSIS — B379 Candidiasis, unspecified: Secondary | ICD-10-CM

## 2021-10-29 ENCOUNTER — Other Ambulatory Visit: Payer: Self-pay | Admitting: Nurse Practitioner

## 2021-10-29 ENCOUNTER — Encounter: Payer: Self-pay | Admitting: Nurse Practitioner

## 2021-10-29 DIAGNOSIS — B379 Candidiasis, unspecified: Secondary | ICD-10-CM

## 2021-10-29 DIAGNOSIS — G8929 Other chronic pain: Secondary | ICD-10-CM

## 2021-10-29 MED ORDER — TRAMADOL HCL 50 MG PO TABS: 50.0000 mg | ORAL_TABLET | Freq: Four times a day (QID) | ORAL | 1 refills | Status: DC | PRN

## 2021-10-29 MED ORDER — FLUCONAZOLE 150 MG PO TABS: ORAL_TABLET | ORAL | 1 refills | Status: DC

## 2021-11-09 ENCOUNTER — Encounter: Payer: Self-pay | Admitting: Nurse Practitioner

## 2021-11-09 DIAGNOSIS — Z8249 Family history of ischemic heart disease and other diseases of the circulatory system: Secondary | ICD-10-CM

## 2021-11-09 DIAGNOSIS — R7301 Impaired fasting glucose: Secondary | ICD-10-CM

## 2021-11-09 DIAGNOSIS — E6609 Other obesity due to excess calories: Secondary | ICD-10-CM

## 2021-11-09 DIAGNOSIS — E781 Pure hyperglyceridemia: Secondary | ICD-10-CM

## 2021-11-11 ENCOUNTER — Ambulatory Visit
Admission: RE | Admit: 2021-11-11 | Discharge: 2021-11-11 | Disposition: A | Discharge status: Home / Self Care | Payer: BC Managed Care – PPO | Source: Ambulatory Visit | Attending: Nurse Practitioner | Admitting: Nurse Practitioner

## 2021-11-11 DIAGNOSIS — Z1231 Encounter for screening mammogram for malignant neoplasm of breast: Secondary | ICD-10-CM | POA: Diagnosis present

## 2021-11-13 MED ORDER — SEMAGLUTIDE(0.25 OR 0.5MG/DOS) 2 MG/1.5ML ~~LOC~~ SOPN: PEN_INJECTOR | SUBCUTANEOUS | 1 refills | Status: DC

## 2021-12-15 ENCOUNTER — Ambulatory Visit (INDEPENDENT_AMBULATORY_CARE_PROVIDER_SITE_OTHER): Payer: BC Managed Care – PPO | Admitting: Nurse Practitioner

## 2021-12-15 ENCOUNTER — Encounter: Payer: Self-pay | Admitting: Nurse Practitioner

## 2021-12-15 VITALS — BP 110/66 | HR 105 | Temp 98.5°F | Resp 16 | Ht 63.0 in | Wt 182.2 lb

## 2021-12-15 DIAGNOSIS — J329 Chronic sinusitis, unspecified: Secondary | ICD-10-CM

## 2021-12-15 DIAGNOSIS — E781 Pure hyperglyceridemia: Secondary | ICD-10-CM

## 2021-12-15 DIAGNOSIS — J301 Allergic rhinitis due to pollen: Secondary | ICD-10-CM

## 2021-12-15 DIAGNOSIS — E66811 Obesity, class 1: Secondary | ICD-10-CM

## 2021-12-15 DIAGNOSIS — Z8249 Family history of ischemic heart disease and other diseases of the circulatory system: Secondary | ICD-10-CM

## 2021-12-15 DIAGNOSIS — I10 Essential (primary) hypertension: Secondary | ICD-10-CM

## 2021-12-15 DIAGNOSIS — F411 Generalized anxiety disorder: Secondary | ICD-10-CM

## 2021-12-15 DIAGNOSIS — E6609 Other obesity due to excess calories: Secondary | ICD-10-CM | POA: Diagnosis not present

## 2021-12-15 DIAGNOSIS — R7301 Impaired fasting glucose: Secondary | ICD-10-CM

## 2021-12-15 DIAGNOSIS — J310 Chronic rhinitis: Secondary | ICD-10-CM

## 2021-12-15 DIAGNOSIS — B3731 Acute candidiasis of vulva and vagina: Secondary | ICD-10-CM

## 2021-12-15 DIAGNOSIS — R7989 Other specified abnormal findings of blood chemistry: Secondary | ICD-10-CM

## 2021-12-15 DIAGNOSIS — M064 Inflammatory polyarthropathy: Secondary | ICD-10-CM

## 2021-12-15 DIAGNOSIS — F5101 Primary insomnia: Secondary | ICD-10-CM

## 2021-12-15 DIAGNOSIS — R197 Diarrhea, unspecified: Secondary | ICD-10-CM

## 2021-12-15 DIAGNOSIS — F331 Major depressive disorder, recurrent, moderate: Secondary | ICD-10-CM

## 2021-12-15 DIAGNOSIS — G43009 Migraine without aura, not intractable, without status migrainosus: Secondary | ICD-10-CM

## 2021-12-15 DIAGNOSIS — Z6832 Body mass index (BMI) 32.0-32.9, adult: Secondary | ICD-10-CM

## 2021-12-15 MED ORDER — MONTELUKAST SODIUM 10 MG PO TABS: ORAL_TABLET | ORAL | 3 refills | Status: DC

## 2021-12-15 MED ORDER — UBRELVY 100 MG PO TABS: 100.0000 mg | ORAL_TABLET | Freq: Every day | ORAL | 3 refills | Status: DC | PRN

## 2021-12-15 MED ORDER — DIPHENOXYLATE-ATROPINE 2.5-0.025 MG PO TABS: 1.0000 | ORAL_TABLET | Freq: Four times a day (QID) | ORAL | 0 refills | Status: DC | PRN

## 2021-12-15 MED ORDER — METOPROLOL SUCCINATE ER 50 MG PO TB24: ORAL_TABLET | ORAL | 1 refills | Status: DC

## 2021-12-15 MED ORDER — GABAPENTIN 100 MG PO CAPS: ORAL_CAPSULE | ORAL | 5 refills | Status: DC

## 2021-12-15 MED ORDER — BUPROPION HCL 75 MG PO TABS: 75.0000 mg | ORAL_TABLET | Freq: Two times a day (BID) | ORAL | 1 refills | Status: DC

## 2021-12-15 MED ORDER — SEMAGLUTIDE(0.25 OR 0.5MG/DOS) 2 MG/3ML ~~LOC~~ SOPN: 0.5000 mg | PEN_INJECTOR | SUBCUTANEOUS | 1 refills | Status: DC

## 2021-12-15 MED ORDER — SERTRALINE HCL 100 MG PO TABS: ORAL_TABLET | ORAL | 1 refills | Status: DC

## 2021-12-15 MED ORDER — TRAZODONE HCL 100 MG PO TABS: ORAL_TABLET | ORAL | 1 refills | Status: DC

## 2021-12-15 MED ORDER — ALPRAZOLAM 0.5 MG PO TABS: ORAL_TABLET | ORAL | 2 refills | Status: DC

## 2021-12-15 NOTE — Progress Notes (Signed)
[facility] [place], [place] [address]  Internal MEDICINE  Office Visit Note  Patient Name: Yvette Humphrey  [id number]  [id number]  Date of Service: 01/01/2022  Chief Complaint  Patient presents with   Follow-up    Neg covid test yesterday, congestion, started yesterday morning    Hyperlipidemia   Hypertension   Results   Sinusitis   Sore Throat    Yesterday but feels better today     HPI [name] presents for follow-up visit for hypertension, hyperlipidemia, chronic sinusitis and rhinitis and lab results.  Patient reports having sore throat yesterday but improving today.  She also reports having congestion that started yesterday morning and that she was negative for COVID yesterday when she tested herself.  She has had persistent sinus infections in the past and reports that she does have issues with chronic sinusitis and allergic rhinitis.  She is currently taking over-the-counter cetirizine 10 mg daily and Singulair 10 mg daily.  She has Flonase and azelastine nasal sprays also but does not use them on a daily basis. Patient reports that she has lost approximately 5 pounds since starting Ozempic and it would like to continue taking Ozempic. Blood pressure and other vitals are stable and within normal limits. Reviewed lab results with the patient: -- Cholesterol levels have greatly improved when compared to 1 year ago -- Her vitamin D level is improved to low normal at 38.1 --B12 and folate levels are normal, thyroid levels are normal and CBC is also normal --Metabolic panel is grossly normal, slightly elevated ALT has resolved   Allergy blood test with labs next year?    Current Medication: Outpatient Encounter Medications as of 12/15/2021  Medication Sig   amoxicillin-clavulanate (AUGMENTIN) 875-125 MG tablet Take 1 tablet by mouth 2 (two) times daily for 28 days.   azelastine (ASTELIN) 0.1 % nasal spray Use 2 sq in each nostril every 6- 8 hrs qd    cetirizine (ZYRTEC) 10 MG tablet Take 10 mg by mouth daily.   chlorpheniramine-HYDROcodone (TUSSIONEX PENNKINETIC ER) 10-8 MG/5ML Take 5 mLs by mouth every 12 (twelve) hours as needed for cough.   cyanocobalamin 500 MCG tablet Take 500 mcg by mouth daily.   fluconazole (DIFLUCAN) 150 MG tablet Take 1 tablet (150 mg total) by mouth once a week. For yeast infection prophylaxis while taking antibiotic.   fluticasone (FLONASE) 50 MCG/ACT nasal spray Place 2 sprays into both nostrils daily.   fluticasone (FLOVENT HFA) 110 MCG/ACT inhaler Inhale 2 puffs into the lungs in the morning and at bedtime.   Lysine 1000 MG TABS Take by mouth daily as needed.   ondansetron (ZOFRAN) 4 MG tablet TAKE 1 TABLET(4 MG) BY MOUTH EVERY 8 HOURS AS NEEDED FOR NAUSEA OR VOMITING   [EXPIRED] predniSONE (DELTASONE) 20 MG tablet Take 2 tablets (40 mg total) by mouth daily with breakfast for 5 days, THEN 1 tablet (20 mg total) daily with breakfast for 5 days.   promethazine-phenylephrine 6.25-5 MG/5ML SYRP Take 5 mLs by mouth every 4 (four) hours as needed for congestion.   rosuvastatin (CRESTOR) 5 MG tablet Take 1 tablet (5 mg total) by mouth daily.   Semaglutide,0.25 or 0.5MG/DOS, 2 MG/3ML SOPN Inject 0.5 mg into the skin once a week.   traMADol (ULTRAM) 50 MG tablet Take 1 tablet (50 mg total) by mouth every 6 (six) hours as needed for severe pain.   triamcinolone cream (KENALOG) 0.1 % Apply topically twice daily as needed for itching and  rash   [DISCONTINUED] ALPRAZolam (XANAX) 0.5 MG tablet TAKE 1 TABLET(0.5 MG) BY MOUTH DAILY AS NEEDED FOR ANXIETY   [DISCONTINUED] buPROPion (WELLBUTRIN) 75 MG tablet Take 1 tablet (75 mg total) by mouth 2 (two) times daily.   [DISCONTINUED] diphenoxylate-atropine (LOMOTIL) 2.5-0.025 MG tablet Take 1 tablet by mouth 4 (four) times daily as needed for diarrhea or loose stools.   [DISCONTINUED] fluconazole (DIFLUCAN) 150 MG tablet TAKE 1 TABLET BY MOUTH 1 TIME. REPEAT AGAIN IF SYMPTOMS PERSIST    [DISCONTINUED] gabapentin (NEURONTIN) 100 MG capsule TAKE 2 CAPSULES BY MOUTH IN AM, TAKE 2 CAPSULES BY MOUTH IN AFTERNOON, TAKE 4 CAPSULES BY MOUTH IN PM   [DISCONTINUED] metoprolol succinate (TOPROL-XL) 50 MG 24 hr tablet TAKE 1 TABLET BY MOUTH EVERY NIGHT AT BEDTIME FOR BLOOD PRESSURE   [DISCONTINUED] montelukast (SINGULAIR) 10 MG tablet TAKE 1 TABLET(10 MG) BY MOUTH AT BEDTIME   [DISCONTINUED] predniSONE (DELTASONE) 10 MG tablet Use as directed for a 6 day taper dose   [DISCONTINUED] Semaglutide,0.25 or 0.5MG/DOS, 2 MG/1.5ML SOPN Inject 0.25 mg under the skin once weekly x4 weeks, then inject 0.5 mg under the skin once weekly start on week #5.   [DISCONTINUED] sertraline (ZOLOFT) 100 MG tablet TAKE 1 TABLET(100 MG) BY MOUTH DAILY   [DISCONTINUED] traZODone (DESYREL) 100 MG tablet TAKE 1 TO 2 TABLETS(100 TO 200 MG) BY MOUTH AT BEDTIME   [DISCONTINUED] Ubrogepant (UBRELVY) 100 MG TABS Take 100 mg by mouth daily as needed (acute migraine).   ALPRAZolam (XANAX) 0.5 MG tablet TAKE 1 TABLET(0.5 MG) BY MOUTH DAILY AS NEEDED FOR ANXIETY   buPROPion (WELLBUTRIN) 75 MG tablet Take 1 tablet (75 mg total) by mouth 2 (two) times daily.   diphenoxylate-atropine (LOMOTIL) 2.5-0.025 MG tablet Take 1 tablet by mouth 4 (four) times daily as needed for diarrhea or loose stools.   gabapentin (NEURONTIN) 100 MG capsule TAKE 2 CAPSULES BY MOUTH IN AM, TAKE 2 CAPSULES BY MOUTH IN AFTERNOON, TAKE 4 CAPSULES BY MOUTH IN PM   metoprolol succinate (TOPROL-XL) 50 MG 24 hr tablet TAKE 1 TABLET BY MOUTH EVERY NIGHT AT BEDTIME FOR BLOOD PRESSURE   montelukast (SINGULAIR) 10 MG tablet TAKE 1 TABLET(10 MG) BY MOUTH AT BEDTIME   sertraline (ZOLOFT) 100 MG tablet TAKE 1 TABLET(100 MG) BY MOUTH DAILY   traZODone (DESYREL) 100 MG tablet TAKE 1 TO 2 TABLETS(100 TO 200 MG) BY MOUTH AT BEDTIME   [DISCONTINUED] Ubrogepant (UBRELVY) 100 MG TABS Take 100 mg by mouth daily as needed (acute migraine).   No facility-administered  encounter medications on file as of 12/15/2021.    Surgical History: Past Surgical History:  Procedure Laterality Date   CHOLECYSTECTOMY     ENDOMETRIAL ABLATION     approx 2009   ESOPHAGOGASTRODUODENOSCOPY     REDUCTION MAMMAPLASTY Bilateral 2004   TONSILLECTOMY     TUBAL LIGATION     ULNAR NERVE TRANSPOSITION Left 06/08/2016   Procedure: SUBCUTANEOUS ANTERIOR ULNAR NERVE TRANSPOSITION LEFT ELBOW;  Surgeon: Corky Mull, MD;  Location: [place];  Service: Orthopedics;  Laterality: Left;    Medical History: Past Medical History:  Diagnosis Date   Complication of anesthesia    Headache    migraines, 1x/mo   Hyperlipidemia    Hypertension    Motion sickness    back seat cars   PONV (postoperative nausea and vomiting)    Tachycardia    Wears contact lenses     Family History: Family History  Problem Relation Age of Onset  Hypertension Mother    Hyperlipidemia Mother    Hyperlipidemia Maternal Grandfather    COPD Maternal Grandfather    Breast cancer Paternal Grandmother     Social History   Socioeconomic History   Marital status: Married    Spouse name: Not on file   Number of children: Not on file   Years of education: Not on file   Highest education level: Not on file  Occupational History   Not on file  Tobacco Use   Smoking status: Never   Smokeless tobacco: Never  Vaping Use   Vaping Use: Never used  Substance and Sexual Activity   Alcohol use: Yes    Alcohol/week: 1.0 standard drink    Types: 1 Glasses of wine per week    Comment: occasionally   Drug use: No   Sexual activity: Not on file  Other Topics Concern   Not on file  Social History Narrative   Not on file   Social Determinants of Health   Financial Resource Strain: Not on file  Food Insecurity: Not on file  Transportation Needs: Not on file  Physical Activity: Not on file  Stress: Not on file  Social Connections: Not on file  Intimate Partner Violence: Not on file       Review of Systems  Constitutional:  Negative for chills, fatigue and unexpected weight change.  HENT:  Positive for congestion, postnasal drip, rhinorrhea, sinus pressure, sneezing and sore throat.   Eyes:  Positive for discharge and itching. Negative for redness.  Respiratory: Negative.  Negative for cough, chest tightness and shortness of breath.   Cardiovascular: Negative.  Negative for chest pain and palpitations.  Gastrointestinal:  Negative for abdominal pain, constipation, diarrhea, nausea and vomiting.  Genitourinary:  Negative for dysuria and frequency.  Musculoskeletal:  Negative for arthralgias, back pain, joint swelling and neck pain.  Skin:  Negative for rash.  Neurological: Negative.  Negative for tremors and numbness.  Hematological:  Negative for adenopathy. Does not bruise/bleed easily.  Psychiatric/Behavioral:  Negative for behavioral problems (Depression), sleep disturbance and suicidal ideas. The patient is not nervous/anxious.    Vital Signs: BP 110/66   Pulse (!) 105   Temp 98.5 F (36.9 C)   Resp 16   Ht 5' 3"  (1.6 m)   Wt 182 lb 3.2 oz (82.6 kg)   SpO2 98%   BMI 32.28 kg/m    Physical Exam Vitals reviewed.  Constitutional:      General: She is not in acute distress.    Appearance: Normal appearance. She is obese. She is not ill-appearing.  HENT:     Head: Normocephalic and atraumatic.  Eyes:     Pupils: Pupils are equal, round, and reactive to light.  Cardiovascular:     Rate and Rhythm: Normal rate and regular rhythm.  Pulmonary:     Effort: Pulmonary effort is normal. No respiratory distress.  Neurological:     Mental Status: She is alert and oriented to person, place, and time.  Psychiatric:        Mood and Affect: Mood normal.        Behavior: Behavior normal.       Assessment/Plan: 1. Impaired fasting glucose Continue ozempic.  - Semaglutide,0.25 or 0.5MG/DOS, 2 MG/3ML SOPN; Inject 0.5 mg into the skin once a week.  Dispense:  9 mL; Refill: 1  2. Class 1 obesity due to excess calories without serious comorbidity with body mass index (BMI) of 32.0 to 32.9 in adult Continue  ozempic as prescribed.  - Semaglutide,0.25 or 0.5MG/DOS, 2 MG/3ML SOPN; Inject 0.5 mg into the skin once a week.  Dispense: 9 mL; Refill: 1  3. Hypertriglyceridemia Continue ozempic as prescribed.  - Semaglutide,0.25 or 0.5MG/DOS, 2 MG/3ML SOPN; Inject 0.5 mg into the skin once a week.  Dispense: 9 mL; Refill: 1  4. Family history of ischemic heart disease Ozempic prescribed.  - Semaglutide,0.25 or 0.5MG/DOS, 2 MG/3ML SOPN; Inject 0.5 mg into the skin once a week.  Dispense: 9 mL; Refill: 1  5. Migraine without aura and without status migrainosus, not intractable Stable, no change.   6. Major depressive disorder, recurrent, moderate (HCC) Continue, refills ordered.  - buPROPion (WELLBUTRIN) 75 MG tablet; Take 1 tablet (75 mg total) by mouth 2 (two) times daily.  Dispense: 180 tablet; Refill: 1  7. Primary insomnia Stable, refills ordered.  - traZODone (DESYREL) 100 MG tablet; TAKE 1 TO 2 TABLETS(100 TO 200 MG) BY MOUTH AT BEDTIME  Dispense: 180 tablet; Refill: 1  8. GAD (generalized anxiety disorder) Stable, refills ordered.  - sertraline (ZOLOFT) 100 MG tablet; TAKE 1 TABLET(100 MG) BY MOUTH DAILY  Dispense: 90 tablet; Refill: 1 - ALPRAZolam (XANAX) 0.5 MG tablet; TAKE 1 TABLET(0.5 MG) BY MOUTH DAILY AS NEEDED FOR ANXIETY  Dispense: 30 tablet; Refill: 2  9. Inflammatory polyarthropathy of multiple sites ([place]) Refills ordered                   - gabapentin (NEURONTIN) 100 MG capsule; TAKE 2 CAPSULES BY MOUTH IN AM, TAKE 2 CAPSULES BY MOUTH IN AFTERNOON, TAKE 4 CAPSULES BY MOUTH IN PM  Dispense: 240 capsule; Refill: 5  10. Essential (primary) hypertension Stable, continue metoprolol as prescribed.  - metoprolol succinate (TOPROL-XL) 50 MG 24 hr tablet; TAKE 1 TABLET BY MOUTH EVERY NIGHT AT BEDTIME FOR BLOOD PRESSURE  Dispense: 90  tablet; Refill: 1  11. Elevated ferritin level Stable, continue to monitor.   12. Non-seasonal allergic rhinitis due to pollen Continue montelukast .  - montelukast (SINGULAIR) 10 MG tablet; TAKE 1 TABLET(10 MG) BY MOUTH AT BEDTIME  Dispense: 90 tablet; Refill: 3  13. Chronic rhinosinusitis Antibiotic prescribed. Prenisone and cough syrup were already prescribed.  - amoxicillin-clavulanate (AUGMENTIN) 875-125 MG tablet; Take 1 tablet by mouth 2 (two) times daily for 28 days.  Dispense: 56 tablet; Refill: 0 - predniSONE (DELTASONE) 20 MG tablet; Take 2 tablets (40 mg total) by mouth daily with breakfast for 5 days, THEN 1 tablet (20 mg total) daily with breakfast for 5 days.  Dispense: 15 tablet; Refill: 0 - chlorpheniramine-HYDROcodone (TUSSIONEX PENNKINETIC ER) 10-8 MG/5ML; Take 5 mLs by mouth every 12 (twelve) hours as needed for cough.  Dispense: 140 mL; Refill: 0  14. Vulvovaginal candidiasis Ordered as a precaution when taking antibiotics just in case.  - fluconazole (DIFLUCAN) 150 MG tablet; Take 1 tablet (150 mg total) by mouth once a week. For yeast infection prophylaxis while taking antibiotic.  Dispense: 4 tablet; Refill: 0  15. Diarrhea, unspecified type Lomotil refills ordereed.  - diphenoxylate-atropine (LOMOTIL) 2.5-0.025 MG tablet; Take 1 tablet by mouth 4 (four) times daily as needed for diarrhea or loose stools.  Dispense: 30 tablet; Refill: 0   General Counseling: riannah stagner understanding of the findings of todays visit and agrees with plan of treatment. I have discussed any further diagnostic evaluation that may be needed or ordered today. We also reviewed her medications today. she has been encouraged to call the office with any questions  or concerns that should arise related to todays visit.    No orders of the defined types were placed in this encounter.   Meds ordered this encounter  Medications   Semaglutide,0.25 or 0.5MG/DOS, 2 MG/3ML SOPN    Sig:  Inject 0.5 mg into the skin once a week.    Dispense:  9 mL    Refill:  1   DISCONTD: Ubrogepant (UBRELVY) 100 MG TABS    Sig: Take 100 mg by mouth daily as needed (acute migraine).    Dispense:  16 tablet    Refill:  3   buPROPion (WELLBUTRIN) 75 MG tablet    Sig: Take 1 tablet (75 mg total) by mouth 2 (two) times daily.    Dispense:  180 tablet    Refill:  1   traZODone (DESYREL) 100 MG tablet    Sig: TAKE 1 TO 2 TABLETS(100 TO 200 MG) BY MOUTH AT BEDTIME    Dispense:  180 tablet    Refill:  1   sertraline (ZOLOFT) 100 MG tablet    Sig: TAKE 1 TABLET(100 MG) BY MOUTH DAILY    Dispense:  90 tablet    Refill:  1   diphenoxylate-atropine (LOMOTIL) 2.5-0.025 MG tablet    Sig: Take 1 tablet by mouth 4 (four) times daily as needed for diarrhea or loose stools.    Dispense:  30 tablet    Refill:  0   montelukast (SINGULAIR) 10 MG tablet    Sig: TAKE 1 TABLET(10 MG) BY MOUTH AT BEDTIME    Dispense:  90 tablet    Refill:  3   ALPRAZolam (XANAX) 0.5 MG tablet    Sig: TAKE 1 TABLET(0.5 MG) BY MOUTH DAILY AS NEEDED FOR ANXIETY    Dispense:  30 tablet    Refill:  2   gabapentin (NEURONTIN) 100 MG capsule    Sig: TAKE 2 CAPSULES BY MOUTH IN AM, TAKE 2 CAPSULES BY MOUTH IN AFTERNOON, TAKE 4 CAPSULES BY MOUTH IN PM    Dispense:  240 capsule    Refill:  5    Please note patient takes a total of 8 capsules per day. 30 day supply is 240 tablets. Please discontinue any prior gabapentin prescriptions and fill this new prescription   metoprolol succinate (TOPROL-XL) 50 MG 24 hr tablet    Sig: TAKE 1 TABLET BY MOUTH EVERY NIGHT AT BEDTIME FOR BLOOD PRESSURE    Dispense:  90 tablet    Refill:  1   amoxicillin-clavulanate (AUGMENTIN) 875-125 MG tablet    Sig: Take 1 tablet by mouth 2 (two) times daily for 28 days.    Dispense:  56 tablet    Refill:  0   predniSONE (DELTASONE) 20 MG tablet    Sig: Take 2 tablets (40 mg total) by mouth daily with breakfast for 5 days, THEN 1 tablet (20 mg total)  daily with breakfast for 5 days.    Dispense:  15 tablet    Refill:  0   fluconazole (DIFLUCAN) 150 MG tablet    Sig: Take 1 tablet (150 mg total) by mouth once a week. For yeast infection prophylaxis while taking antibiotic.    Dispense:  4 tablet    Refill:  0   chlorpheniramine-HYDROcodone (TUSSIONEX PENNKINETIC ER) 10-8 MG/5ML    Sig: Take 5 mLs by mouth every 12 (twelve) hours as needed for cough.    Dispense:  140 mL    Refill:  0    Return in about 3  months (around 03/17/2022) for F/U, Weight loss, med refill, Shresta Risden PCP.   Total time spent:30 Minutes Time spent includes review of chart, medications, test results, and follow up plan with the patient.   [place] Controlled Substance Database was reviewed by me.  This patient was seen by Jonetta Osgood, FNP-C in collaboration with Dr. Clayborn Bigness as a part of collaborative care agreement.   Mahmoud Blazejewski R. Valetta Fuller, MSN, FNP-C Internal medicine

## 2021-12-17 ENCOUNTER — Telehealth: Payer: Self-pay

## 2021-12-17 ENCOUNTER — Encounter: Payer: Self-pay | Admitting: Nurse Practitioner

## 2021-12-17 MED ORDER — HYDROCOD POLI-CHLORPHE POLI ER 10-8 MG/5ML PO SUER: 5.0000 mL | Freq: Two times a day (BID) | ORAL | 0 refills | Status: DC | PRN

## 2021-12-17 MED ORDER — AMOXICILLIN-POT CLAVULANATE 875-125 MG PO TABS: 1.0000 | ORAL_TABLET | Freq: Two times a day (BID) | ORAL | 0 refills | Status: AC

## 2021-12-17 MED ORDER — FLUCONAZOLE 150 MG PO TABS: 150.0000 mg | ORAL_TABLET | ORAL | 0 refills | Status: DC

## 2021-12-17 MED ORDER — PREDNISONE 20 MG PO TABS: ORAL_TABLET | ORAL | 0 refills | Status: AC

## 2021-12-17 NOTE — Telephone Encounter (Signed)
Spoke to pt, informed her meds were sent  ?

## 2021-12-19 ENCOUNTER — Telehealth: Payer: Self-pay

## 2021-12-19 NOTE — Telephone Encounter (Signed)
PA for UBRELVY sent 12/19/21 @ 9 pm ?

## 2021-12-20 ENCOUNTER — Other Ambulatory Visit: Payer: Self-pay

## 2021-12-20 DIAGNOSIS — G43009 Migraine without aura, not intractable, without status migrainosus: Secondary | ICD-10-CM

## 2021-12-20 MED ORDER — UBRELVY 100 MG PO TABS: 100.0000 mg | ORAL_TABLET | Freq: Every day | ORAL | 3 refills | Status: DC | PRN

## 2021-12-20 NOTE — Telephone Encounter (Signed)
PA for UBRELVY was approved 12/20/21 valid from 12/19/21 to 12/20/22.  Pt informed and new rx sent to pharmacy ?

## 2022-01-01 ENCOUNTER — Encounter: Payer: Self-pay | Admitting: Nurse Practitioner

## 2022-01-07 ENCOUNTER — Encounter: Payer: Self-pay | Admitting: Physician Assistant

## 2022-01-07 ENCOUNTER — Ambulatory Visit (INDEPENDENT_AMBULATORY_CARE_PROVIDER_SITE_OTHER): Payer: BC Managed Care – PPO | Admitting: Physician Assistant

## 2022-01-07 VITALS — BP 106/73 | HR 90 | Temp 98.2°F | Resp 16 | Ht 62.0 in | Wt 177.8 lb

## 2022-01-07 DIAGNOSIS — H6123 Impacted cerumen, bilateral: Secondary | ICD-10-CM | POA: Diagnosis not present

## 2022-01-07 NOTE — Progress Notes (Signed)
Bilateral ear lavage completed on patient.  Cleared both ears of impacted cerumen.  Pt was informed that she could have dizziness due to ear lavage and to be cautious with walking.

## 2022-01-07 NOTE — Progress Notes (Signed)
[facility] [place], [place] [address]  Internal MEDICINE  Office Visit Note  Patient Name: Yvette Humphrey  [id number]  [id number]  Date of Service: 01/07/2022  Chief Complaint  Patient presents with   Acute Visit   Ear Fullness    Left ear, pt accidentally jabbed the q-tip into ear and it hurt, hearing is muffled     HPI Pt is here for sick visit -She reports that she was cleaning her ears last night with a qtip and thinks she pushed wax down.  -since last night has had a little Left sided ear pressure and slightly muffled sound but denies any ear pain or drainage -Reports she has done this before and this feels the same and requests her ears be cleaned out  Current Medication: Outpatient Encounter Medications as of 01/07/2022  Medication Sig   ALPRAZolam (XANAX) 0.5 MG tablet TAKE 1 TABLET(0.5 MG) BY MOUTH DAILY AS NEEDED FOR ANXIETY   amoxicillin-clavulanate (AUGMENTIN) 875-125 MG tablet Take 1 tablet by mouth 2 (two) times daily for 28 days.   azelastine (ASTELIN) 0.1 % nasal spray Use 2 sq in each nostril every 6- 8 hrs qd   buPROPion (WELLBUTRIN) 75 MG tablet Take 1 tablet (75 mg total) by mouth 2 (two) times daily.   cetirizine (ZYRTEC) 10 MG tablet Take 10 mg by mouth daily.   chlorpheniramine-HYDROcodone (TUSSIONEX PENNKINETIC ER) 10-8 MG/5ML Take 5 mLs by mouth every 12 (twelve) hours as needed for cough.   cyanocobalamin 500 MCG tablet Take 500 mcg by mouth daily.   diphenoxylate-atropine (LOMOTIL) 2.5-0.025 MG tablet Take 1 tablet by mouth 4 (four) times daily as needed for diarrhea or loose stools.   fluconazole (DIFLUCAN) 150 MG tablet Take 1 tablet (150 mg total) by mouth once a week. For yeast infection prophylaxis while taking antibiotic.   fluticasone (FLONASE) 50 MCG/ACT nasal spray Place 2 sprays into both nostrils daily.   fluticasone (FLOVENT HFA) 110 MCG/ACT inhaler Inhale 2 puffs into the lungs in the morning and at bedtime.    gabapentin (NEURONTIN) 100 MG capsule TAKE 2 CAPSULES BY MOUTH IN AM, TAKE 2 CAPSULES BY MOUTH IN AFTERNOON, TAKE 4 CAPSULES BY MOUTH IN PM   Lysine 1000 MG TABS Take by mouth daily as needed.   metoprolol succinate (TOPROL-XL) 50 MG 24 hr tablet TAKE 1 TABLET BY MOUTH EVERY NIGHT AT BEDTIME FOR BLOOD PRESSURE   montelukast (SINGULAIR) 10 MG tablet TAKE 1 TABLET(10 MG) BY MOUTH AT BEDTIME   ondansetron (ZOFRAN) 4 MG tablet TAKE 1 TABLET(4 MG) BY MOUTH EVERY 8 HOURS AS NEEDED FOR NAUSEA OR VOMITING   promethazine-phenylephrine 6.25-5 MG/5ML SYRP Take 5 mLs by mouth every 4 (four) hours as needed for congestion.   rosuvastatin (CRESTOR) 5 MG tablet Take 1 tablet (5 mg total) by mouth daily.   Semaglutide,0.25 or 0.5MG/DOS, 2 MG/3ML SOPN Inject 0.5 mg into the skin once a week.   sertraline (ZOLOFT) 100 MG tablet TAKE 1 TABLET(100 MG) BY MOUTH DAILY   traMADol (ULTRAM) 50 MG tablet Take 1 tablet (50 mg total) by mouth every 6 (six) hours as needed for severe pain.   traZODone (DESYREL) 100 MG tablet TAKE 1 TO 2 TABLETS(100 TO 200 MG) BY MOUTH AT BEDTIME   triamcinolone cream (KENALOG) 0.1 % Apply topically twice daily as needed for itching and rash   Ubrogepant (UBRELVY) 100 MG TABS Take 100 mg by mouth daily as needed (acute migraine).   No facility-administered encounter  medications on file as of 01/07/2022.    Surgical History: Past Surgical History:  Procedure Laterality Date   CHOLECYSTECTOMY     ENDOMETRIAL ABLATION     approx 2009   ESOPHAGOGASTRODUODENOSCOPY     REDUCTION MAMMAPLASTY Bilateral 2004   TONSILLECTOMY     TUBAL LIGATION     ULNAR NERVE TRANSPOSITION Left 06/08/2016   Procedure: SUBCUTANEOUS ANTERIOR ULNAR NERVE TRANSPOSITION LEFT ELBOW;  Surgeon: Corky Mull, MD;  Location: [facility];  Service: Orthopedics;  Laterality: Left;    Medical History: Past Medical History:  Diagnosis Date   Complication of anesthesia    Headache    migraines, 1x/mo    Hyperlipidemia    Hypertension    Motion sickness    back seat cars   PONV (postoperative nausea and vomiting)    Tachycardia    Wears contact lenses     Family History: Family History  Problem Relation Age of Onset   Hypertension Mother    Hyperlipidemia Mother    Hyperlipidemia Maternal Grandfather    COPD Maternal Grandfather    Breast cancer Paternal Grandmother     Social History   Socioeconomic History   Marital status: Married    Spouse name: Not on file   Number of children: Not on file   Years of education: Not on file   Highest education level: Not on file  Occupational History   Not on file  Tobacco Use   Smoking status: Never   Smokeless tobacco: Never  Vaping Use   Vaping Use: Never used  Substance and Sexual Activity   Alcohol use: Yes    Alcohol/week: 1.0 standard drink    Types: 1 Glasses of wine per week    Comment: occasionally   Drug use: No   Sexual activity: Not on file  Other Topics Concern   Not on file  Social History Narrative   Not on file   Social Determinants of Health   Financial Resource Strain: Not on file  Food Insecurity: Not on file  Transportation Needs: Not on file  Physical Activity: Not on file  Stress: Not on file  Social Connections: Not on file  Intimate Partner Violence: Not on file      Review of Systems  Constitutional:  Negative for fatigue and fever.  HENT:  Negative for congestion, mouth sores and postnasal drip.        Muffled hearing on left  Respiratory:  Negative for cough.   Cardiovascular:  Negative for chest pain.  Genitourinary:  Negative for flank pain.  Psychiatric/Behavioral: Negative.     Vital Signs: BP 106/73   Pulse 90   Temp 98.2 F (36.8 C)   Resp 16   Ht 5' 2"  (1.575 m)   Wt 177 lb 12.8 oz (80.6 kg)   SpO2 97%   BMI 32.52 kg/m    Physical Exam Vitals and nursing note reviewed.  Constitutional:      General: She is not in acute distress.    Appearance: Normal  appearance. She is obese. She is not ill-appearing.  HENT:     Head: Normocephalic and atraumatic.     Right Ear: Tympanic membrane normal.     Left Ear: There is impacted cerumen.  Eyes:     Pupils: Pupils are equal, round, and reactive to light.  Cardiovascular:     Rate and Rhythm: Normal rate and regular rhythm.  Pulmonary:     Effort: Pulmonary effort is normal. No respiratory  distress.  Neurological:     Mental Status: She is alert and oriented to person, place, and time.  Psychiatric:        Mood and Affect: Mood normal.        Behavior: Behavior normal.       Assessment/Plan: 1. Impacted cerumen of both ears Will have ear lavage to remove wax and follow up as needed. Advised against using instrument in ears due to risk to TM - Ear Lavage   General Counseling: younique casad understanding of the findings of todays visit and agrees with plan of treatment. I have discussed any further diagnostic evaluation that may be needed or ordered today. We also reviewed her medications today. she has been encouraged to call the office with any questions or concerns that should arise related to todays visit.    Orders Placed This Encounter  Procedures   Ear Lavage    No orders of the defined types were placed in this encounter.   This patient was seen by Drema Dallas, PA-C in collaboration with Dr. Clayborn Bigness as a part of collaborative care agreement.   Total time spent:25 Minutes Time spent includes review of chart, medications, test results, and follow up plan with the patient.      Dr Lavera Guise Internal medicine

## 2022-02-03 ENCOUNTER — Other Ambulatory Visit: Payer: Self-pay

## 2022-02-03 ENCOUNTER — Encounter: Payer: Self-pay | Admitting: Nurse Practitioner

## 2022-02-03 DIAGNOSIS — E6609 Other obesity due to excess calories: Secondary | ICD-10-CM

## 2022-02-03 DIAGNOSIS — R7301 Impaired fasting glucose: Secondary | ICD-10-CM

## 2022-02-03 DIAGNOSIS — E781 Pure hyperglyceridemia: Secondary | ICD-10-CM

## 2022-02-03 DIAGNOSIS — Z8249 Family history of ischemic heart disease and other diseases of the circulatory system: Secondary | ICD-10-CM

## 2022-02-03 MED ORDER — SEMAGLUTIDE(0.25 OR 0.5MG/DOS) 2 MG/3ML ~~LOC~~ SOPN: PEN_INJECTOR | SUBCUTANEOUS | 1 refills | Status: DC

## 2022-02-03 NOTE — Telephone Encounter (Signed)
Can u take care of this

## 2022-02-16 ENCOUNTER — Other Ambulatory Visit: Payer: Self-pay | Admitting: Nurse Practitioner

## 2022-02-16 DIAGNOSIS — T50905A Adverse effect of unspecified drugs, medicaments and biological substances, initial encounter: Secondary | ICD-10-CM

## 2022-02-21 IMAGING — CR DG CHEST 2V
1 series · 2 of 2 positions shown · non-contrast
Comparison: 09/01/2010

CLINICAL DATA: Productive cough, fever, and chills.

EXAM:
CHEST - 2 VIEW

[Series 1: dg chest 2 view · 0.14mm/px · 2 of 2 slices shown]
[im 1/2]
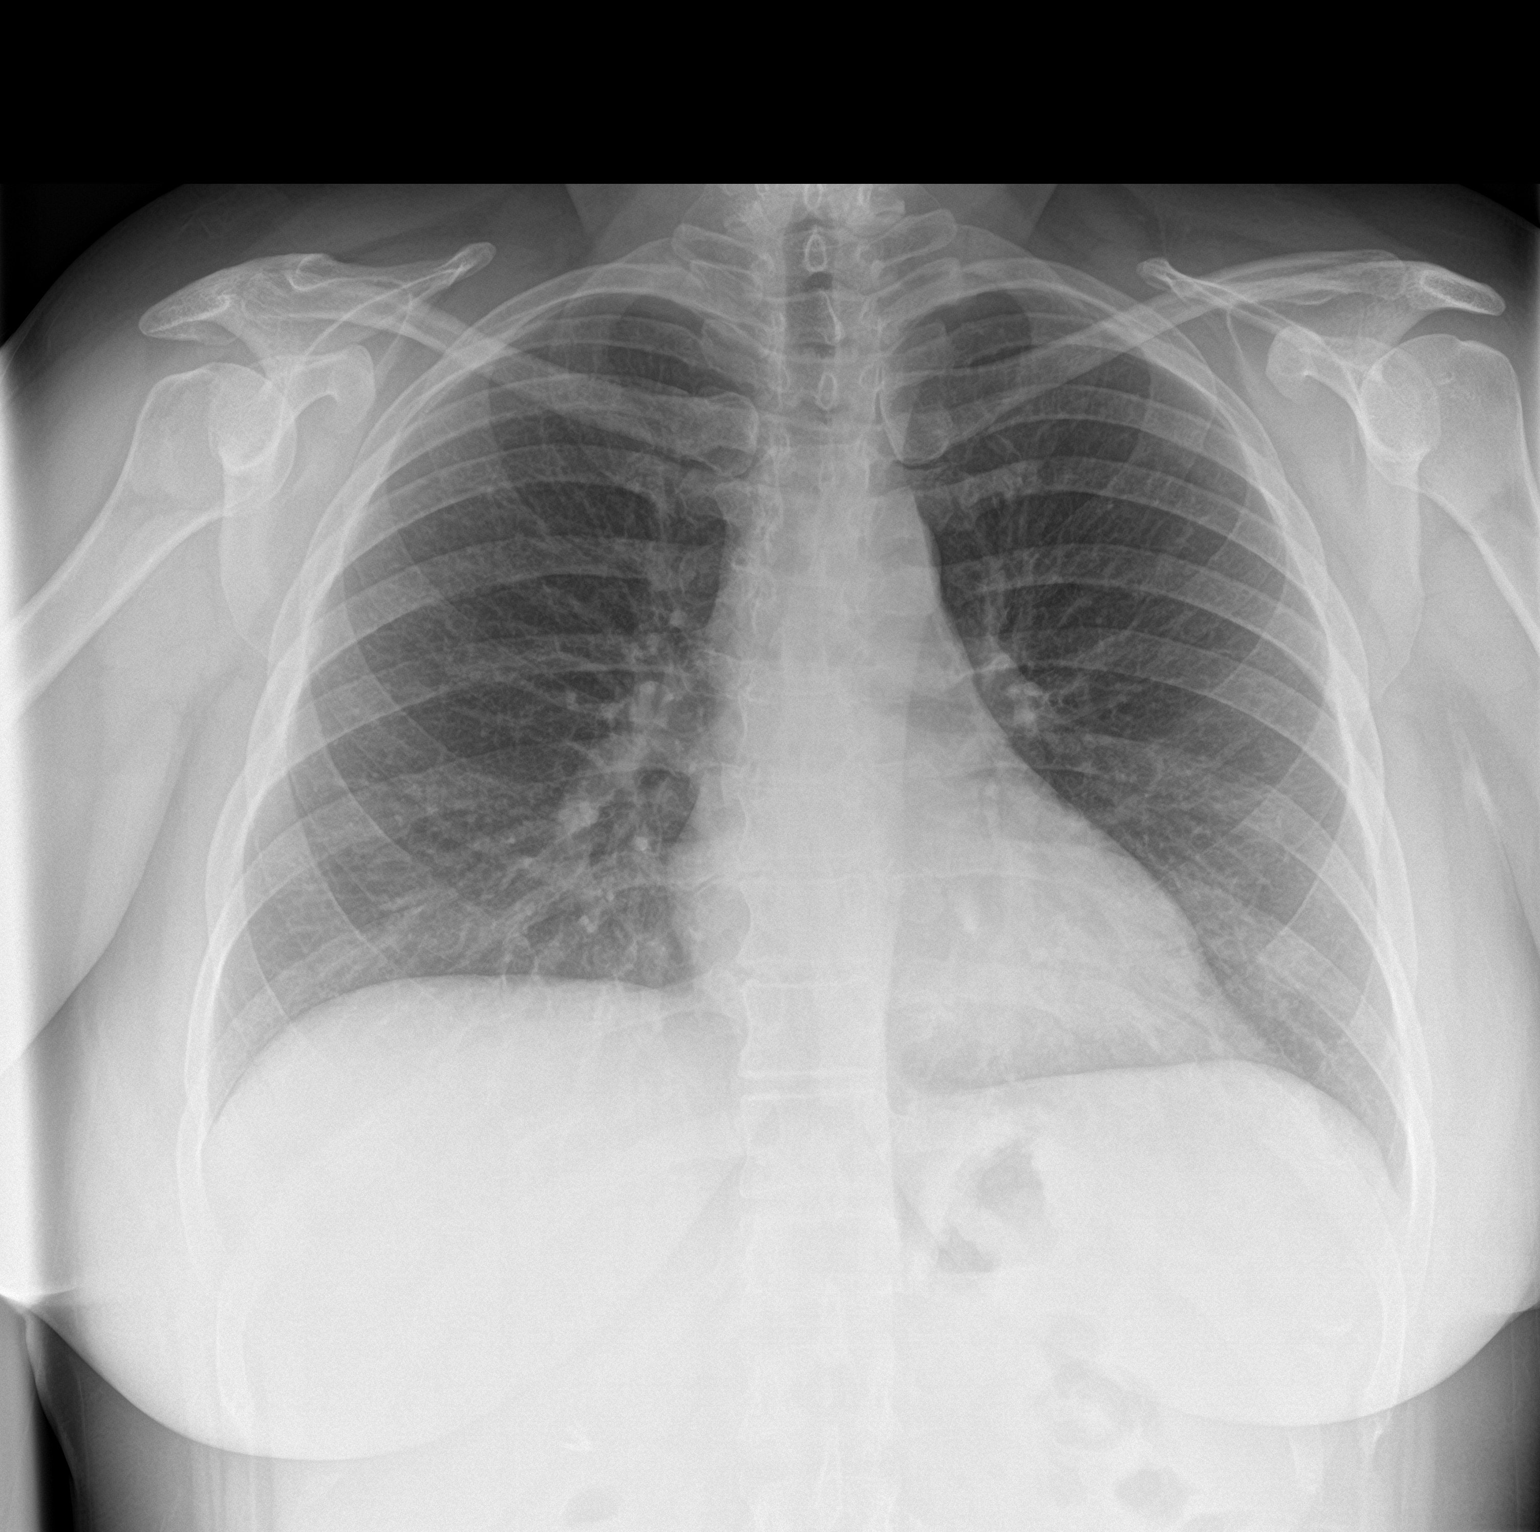
[im 2/2]
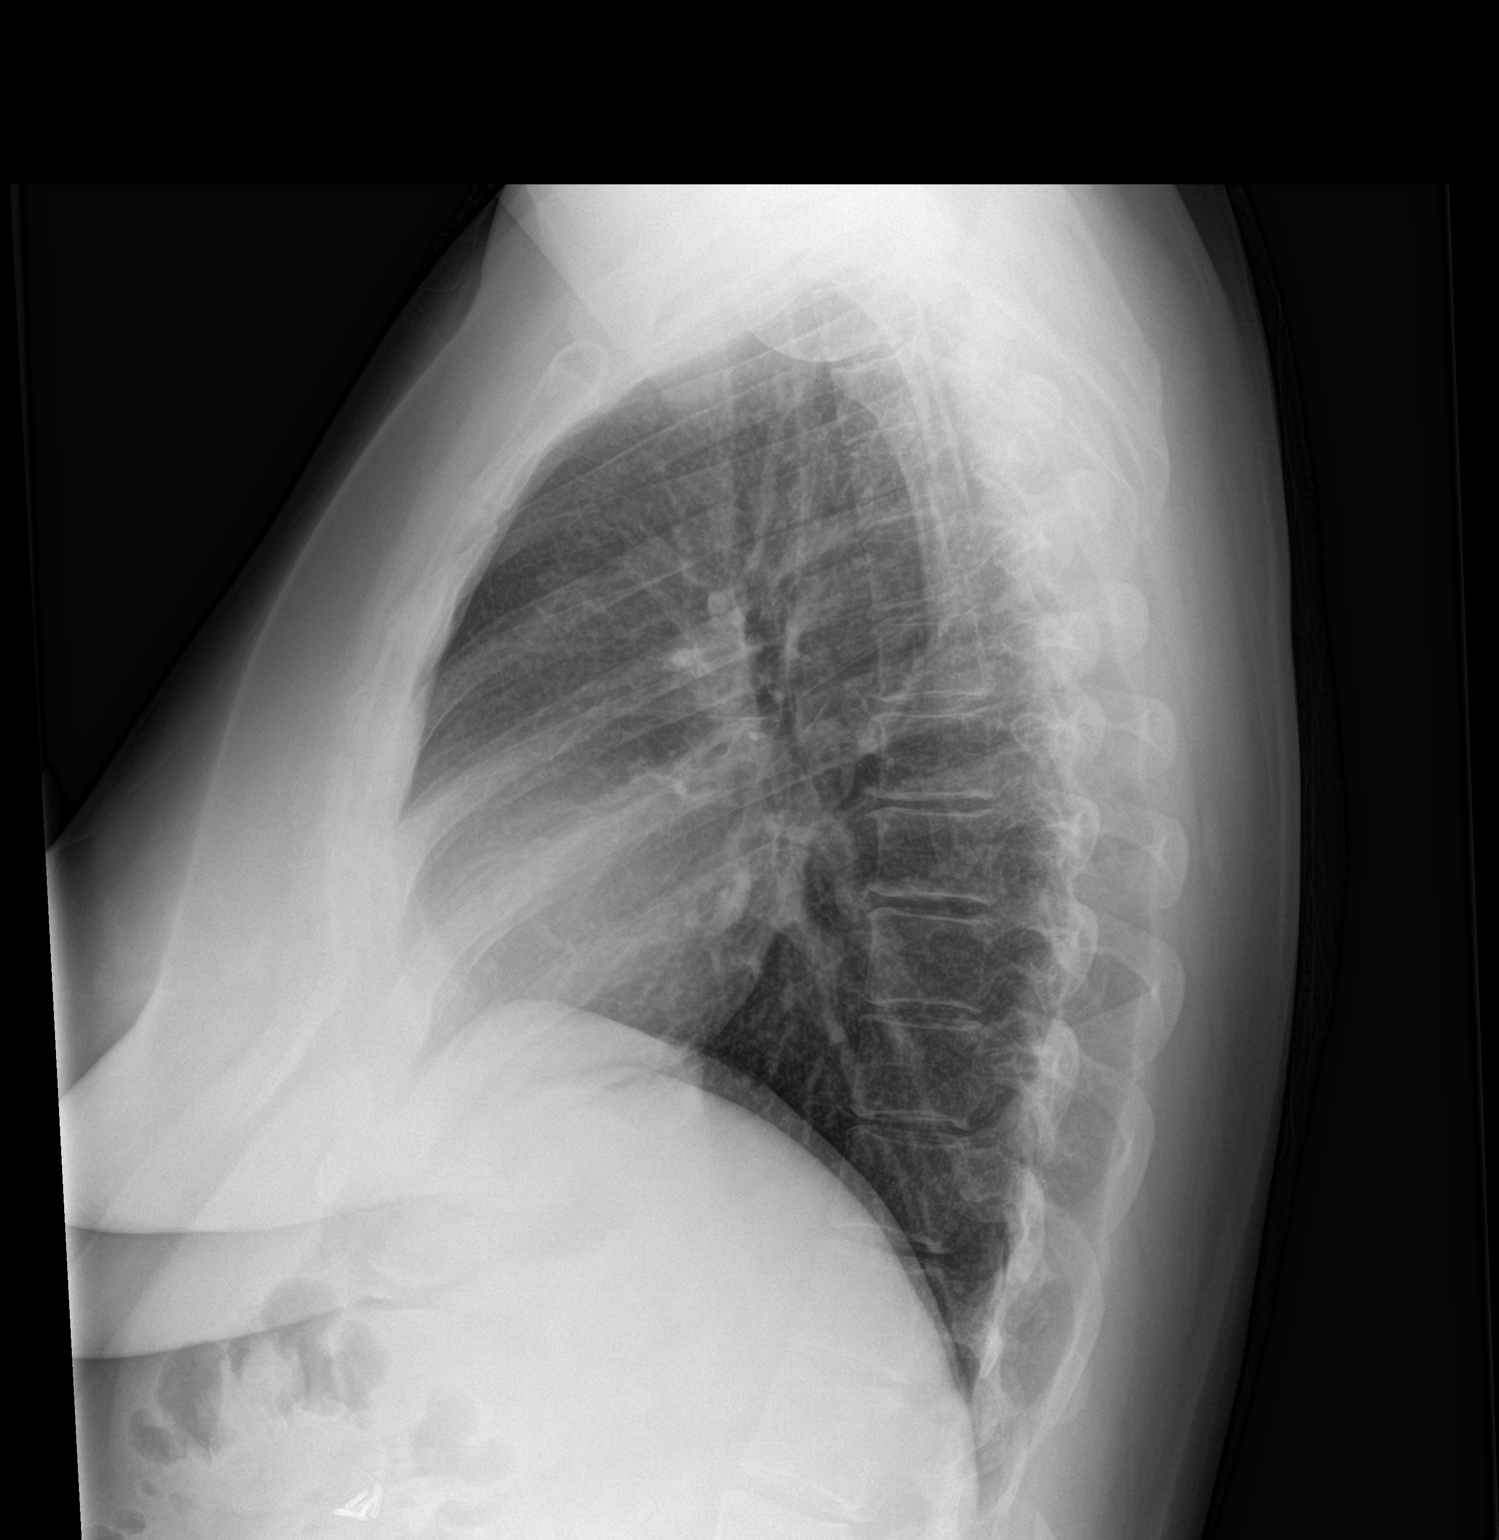

[2 of 2 positions shown; findings below may reference images not displayed]

FINDINGS: The heart size and mediastinal contours are within normal limits.
Both lungs are clear. The visualized skeletal structures are
unremarkable.
IMPRESSION: Negative.  No active cardiopulmonary disease.

## 2022-02-28 ENCOUNTER — Other Ambulatory Visit: Payer: Self-pay | Admitting: Nurse Practitioner

## 2022-02-28 DIAGNOSIS — E782 Mixed hyperlipidemia: Secondary | ICD-10-CM

## 2022-03-14 ENCOUNTER — Encounter: Payer: Self-pay | Admitting: Nurse Practitioner

## 2022-03-14 ENCOUNTER — Ambulatory Visit (INDEPENDENT_AMBULATORY_CARE_PROVIDER_SITE_OTHER): Payer: BC Managed Care – PPO | Admitting: Nurse Practitioner

## 2022-03-14 DIAGNOSIS — E6609 Other obesity due to excess calories: Secondary | ICD-10-CM

## 2022-03-14 DIAGNOSIS — Z8249 Family history of ischemic heart disease and other diseases of the circulatory system: Secondary | ICD-10-CM | POA: Diagnosis not present

## 2022-03-14 DIAGNOSIS — E781 Pure hyperglyceridemia: Secondary | ICD-10-CM

## 2022-03-14 DIAGNOSIS — R7301 Impaired fasting glucose: Secondary | ICD-10-CM

## 2022-03-14 DIAGNOSIS — R112 Nausea with vomiting, unspecified: Secondary | ICD-10-CM | POA: Diagnosis not present

## 2022-03-14 DIAGNOSIS — Z6832 Body mass index (BMI) 32.0-32.9, adult: Secondary | ICD-10-CM

## 2022-03-14 MED ORDER — ONDANSETRON HCL 4 MG PO TABS: 4.0000 mg | ORAL_TABLET | Freq: Three times a day (TID) | ORAL | 5 refills | Status: DC | PRN

## 2022-03-14 MED ORDER — SEMAGLUTIDE(0.25 OR 0.5MG/DOS) 2 MG/3ML ~~LOC~~ SOPN: PEN_INJECTOR | SUBCUTANEOUS | 2 refills | Status: DC

## 2022-03-14 NOTE — Progress Notes (Signed)
[facility] [place], [place] [address]  Internal MEDICINE  Office Visit Note  Patient Name: Yvette Humphrey  [id number]  [id number]  Date of Service: 03/14/2022  Chief Complaint  Patient presents with   Follow-up   Medical Management of Chronic Issues    Weight management    HPI Yvette Humphrey presents for a follow up visit for weight loss management, increased risk of heart disease, hypertriglyceridemia.  --increased side effects with 0.75 mg dose of ozempic for the first week or 2, mostly just nausea and some vomiting. Side effects have resolved as of this week.  --weight dropped to 169 lbs but did gain back a few pounds.  --weight was 173.4 lbs at home this morning.      Current Medication: Outpatient Encounter Medications as of 03/14/2022  Medication Sig   ALPRAZolam (XANAX) 0.5 MG tablet TAKE 1 TABLET(0.5 MG) BY MOUTH DAILY AS NEEDED FOR ANXIETY   azelastine (ASTELIN) 0.1 % nasal spray Use 2 sq in each nostril every 6- 8 hrs qd   buPROPion (WELLBUTRIN) 75 MG tablet Take 1 tablet (75 mg total) by mouth 2 (two) times daily.   cetirizine (ZYRTEC) 10 MG tablet Take 10 mg by mouth daily.   cyanocobalamin 500 MCG tablet Take 500 mcg by mouth daily.   diphenoxylate-atropine (LOMOTIL) 2.5-0.025 MG tablet Take 1 tablet by mouth 4 (four) times daily as needed for diarrhea or loose stools.   fluconazole (DIFLUCAN) 150 MG tablet Take 1 tablet (150 mg total) by mouth once a week. For yeast infection prophylaxis while taking antibiotic.   fluticasone (FLONASE) 50 MCG/ACT nasal spray Place 2 sprays into both nostrils daily.   fluticasone (FLOVENT HFA) 110 MCG/ACT inhaler Inhale 2 puffs into the lungs in the morning and at bedtime.   gabapentin (NEURONTIN) 100 MG capsule TAKE 2 CAPSULES BY MOUTH IN AM, TAKE 2 CAPSULES BY MOUTH IN AFTERNOON, TAKE 4 CAPSULES BY MOUTH IN PM   Lysine 1000 MG TABS Take by mouth daily as needed.   metoprolol succinate (TOPROL-XL) 50 MG 24 hr tablet  TAKE 1 TABLET BY MOUTH EVERY NIGHT AT BEDTIME FOR BLOOD PRESSURE   montelukast (SINGULAIR) 10 MG tablet TAKE 1 TABLET(10 MG) BY MOUTH AT BEDTIME   rosuvastatin (CRESTOR) 5 MG tablet TAKE 1 TABLET(5 MG) BY MOUTH DAILY   sertraline (ZOLOFT) 100 MG tablet TAKE 1 TABLET(100 MG) BY MOUTH DAILY   traMADol (ULTRAM) 50 MG tablet Take 1 tablet (50 mg total) by mouth every 6 (six) hours as needed for severe pain.   traZODone (DESYREL) 100 MG tablet TAKE 1 TO 2 TABLETS(100 TO 200 MG) BY MOUTH AT BEDTIME   triamcinolone cream (KENALOG) 0.1 % Apply topically twice daily as needed for itching and rash   Ubrogepant (UBRELVY) 100 MG TABS Take 100 mg by mouth daily as needed (acute migraine).   [DISCONTINUED] chlorpheniramine-HYDROcodone (TUSSIONEX PENNKINETIC ER) 10-8 MG/5ML Take 5 mLs by mouth every 12 (twelve) hours as needed for cough.   [DISCONTINUED] ondansetron (ZOFRAN) 4 MG tablet TAKE 1 TABLET(4 MG) BY MOUTH EVERY 8 HOURS AS NEEDED FOR NAUSEA OR VOMITING   [DISCONTINUED] promethazine-phenylephrine 6.25-5 MG/5ML SYRP Take 5 mLs by mouth every 4 (four) hours as needed for congestion.   [DISCONTINUED] Semaglutide,0.25 or 0.5MG/DOS, 2 MG/3ML SOPN Inject 0.25 mg and .5 mg once a week (total .75 mg) under the skin for 5 weeks   ondansetron (ZOFRAN) 4 MG tablet Take 1 tablet (4 mg total) by mouth every 8 (eight)  hours as needed for nausea or vomiting.   Semaglutide,0.25 or 0.5MG/DOS, 2 MG/3ML SOPN Inject 0.25 mg and .5 mg once a week (total .75 mg) under the skin for 5 weeks   No facility-administered encounter medications on file as of 03/14/2022.    Surgical History: Past Surgical History:  Procedure Laterality Date   CHOLECYSTECTOMY     ENDOMETRIAL ABLATION     approx 2009   ESOPHAGOGASTRODUODENOSCOPY     REDUCTION MAMMAPLASTY Bilateral 2004   TONSILLECTOMY     TUBAL LIGATION     ULNAR NERVE TRANSPOSITION Left 06/08/2016   Procedure: SUBCUTANEOUS ANTERIOR ULNAR NERVE TRANSPOSITION LEFT ELBOW;  Surgeon:  Corky Mull, MD;  Location: [facility];  Service: Orthopedics;  Laterality: Left;    Medical History: Past Medical History:  Diagnosis Date   Complication of anesthesia    Headache    migraines, 1x/mo   Hyperlipidemia    Hypertension    Motion sickness    back seat cars   PONV (postoperative nausea and vomiting)    Tachycardia    Wears contact lenses     Family History: Family History  Problem Relation Age of Onset   Hypertension Mother    Hyperlipidemia Mother    Hyperlipidemia Maternal Grandfather    COPD Maternal Grandfather    Breast cancer Paternal Grandmother     Social History   Socioeconomic History   Marital status: Married    Spouse name: Not on file   Number of children: Not on file   Years of education: Not on file   Highest education level: Not on file  Occupational History   Not on file  Tobacco Use   Smoking status: Never   Smokeless tobacco: Never  Vaping Use   Vaping Use: Never used  Substance and Sexual Activity   Alcohol use: Yes    Alcohol/week: 1.0 standard drink of alcohol    Types: 1 Glasses of wine per week    Comment: occasionally   Drug use: No   Sexual activity: Not on file  Other Topics Concern   Not on file  Social History Narrative   Not on file   Social Determinants of Health   Financial Resource Strain: Not on file  Food Insecurity: Not on file  Transportation Needs: Not on file  Physical Activity: Not on file  Stress: Not on file  Social Connections: Not on file  Intimate Partner Violence: Not on file      Review of Systems  Constitutional:  Negative for chills, fatigue and unexpected weight change.  HENT:  Positive for postnasal drip. Negative for congestion, rhinorrhea, sneezing and sore throat.   Eyes:  Negative for redness.  Respiratory:  Negative for cough, chest tightness and shortness of breath.   Cardiovascular:  Negative for chest pain and palpitations.  Gastrointestinal:  Positive for  nausea. Negative for abdominal pain, constipation, diarrhea and vomiting.  Genitourinary:  Negative for dysuria and frequency.  Musculoskeletal:  Negative for arthralgias, back pain, joint swelling and neck pain.  Skin:  Negative for rash.  Neurological: Negative.  Negative for tremors and numbness.  Hematological:  Negative for adenopathy. Does not bruise/bleed easily.  Psychiatric/Behavioral:  Negative for behavioral problems (Depression), sleep disturbance and suicidal ideas. The patient is not nervous/anxious.     Vital Signs: BP 110/80   Pulse 84   Temp 98 F (36.7 C)   Resp 16   Ht '5\' 2"'  (1.575 m)   Wt 175 lb (79.4 kg)  SpO2 98%   BMI 32.01 kg/m    Physical Exam Vitals reviewed.  Constitutional:      General: She is not in acute distress.    Appearance: Normal appearance. She is obese. She is not ill-appearing.  HENT:     Head: Normocephalic and atraumatic.  Eyes:     Pupils: Pupils are equal, round, and reactive to light.  Cardiovascular:     Rate and Rhythm: Normal rate and regular rhythm.  Pulmonary:     Effort: Pulmonary effort is normal. No respiratory distress.  Neurological:     Mental Status: She is alert and oriented to person, place, and time.  Psychiatric:        Mood and Affect: Mood normal.        Behavior: Behavior normal.       Assessment/Plan: 1. Impaired fasting glucose Ozempic refills ordered. Labs ordered to be drawn prior to November office visit.  - Semaglutide,0.25 or 0.5MG/DOS, 2 MG/3ML SOPN; Inject 0.25 mg and .5 mg once a week (total .75 mg) under the skin for 5 weeks  Dispense: 6 mL; Refill: 2 - CMP14+EGFR  2. Drug-induced nausea and vomiting Refills ordered for zofran, due to side effect of ozempic - ondansetron (ZOFRAN) 4 MG tablet; Take 1 tablet (4 mg total) by mouth every 8 (eight) hours as needed for nausea or vomiting.  Dispense: 18 tablet; Refill: 5  3. Hypertriglyceridemia Repeat lipid panel prior to November office  visit.  - Semaglutide,0.25 or 0.5MG/DOS, 2 MG/3ML SOPN; Inject 0.25 mg and .5 mg once a week (total .75 mg) under the skin for 5 weeks  Dispense: 6 mL; Refill: 2 - Lipid Profile  4. Family history of ischemic heart disease Labs ordered, ozempic refilled.  - Semaglutide,0.25 or 0.5MG/DOS, 2 MG/3ML SOPN; Inject 0.25 mg and .5 mg once a week (total .75 mg) under the skin for 5 weeks  Dispense: 6 mL; Refill: 2 - CMP14+EGFR - Lipid Profile  5. Class 1 obesity due to excess calories without serious comorbidity with body mass index (BMI) of 32.0 to 32.9 in adult Weight at home was 173.4 lbs this morning. Lost approx 4 lbs since previous office visit. Would like to continue at current dose and will follow up in November at her annual physical. - Semaglutide,0.25 or 0.5MG/DOS, 2 MG/3ML SOPN; Inject 0.25 mg and .5 mg once a week (total .75 mg) under the skin for 5 weeks  Dispense: 6 mL; Refill: 2   General Counseling: Yvette Humphrey verbalizes understanding of the findings of todays visit and agrees with plan of treatment. I have discussed any further diagnostic evaluation that may be needed or ordered today. We also reviewed her medications today. she has been encouraged to call the office with any questions or concerns that should arise related to todays visit.    Orders Placed This Encounter  Procedures   CMP14+EGFR   Lipid Profile    Meds ordered this encounter  Medications   Semaglutide,0.25 or 0.5MG/DOS, 2 MG/3ML SOPN    Sig: Inject 0.25 mg and .5 mg once a week (total .75 mg) under the skin for 5 weeks    Dispense:  6 mL    Refill:  2   ondansetron (ZOFRAN) 4 MG tablet    Sig: Take 1 tablet (4 mg total) by mouth every 8 (eight) hours as needed for nausea or vomiting.    Dispense:  18 tablet    Refill:  5    Return in 3 months (on  06/17/2022) for CPE, Yvette Humphrey PCP, previously scheduled and also talk about weight loss. .   Total time spent:30 Minutes Time spent includes review of chart,  medications, test results, and follow up plan with the patient.   [place] Controlled Substance Database was reviewed by me.  This patient was seen by Jonetta Osgood, FNP-C in collaboration with Dr. Clayborn Bigness as a part of collaborative care agreement.   Nicolaos Mitrano R. Valetta Fuller, MSN, FNP-C Internal medicine

## 2022-03-15 ENCOUNTER — Encounter: Payer: Self-pay | Admitting: Nurse Practitioner

## 2022-04-28 ENCOUNTER — Encounter: Payer: Self-pay | Admitting: Nurse Practitioner

## 2022-04-28 ENCOUNTER — Ambulatory Visit (INDEPENDENT_AMBULATORY_CARE_PROVIDER_SITE_OTHER): Payer: BC Managed Care – PPO | Admitting: Nurse Practitioner

## 2022-04-28 VITALS — BP 119/71 | HR 88 | Temp 97.5°F | Resp 16 | Ht 62.0 in | Wt 172.8 lb

## 2022-04-28 DIAGNOSIS — M25512 Pain in left shoulder: Secondary | ICD-10-CM

## 2022-04-28 DIAGNOSIS — G8929 Other chronic pain: Secondary | ICD-10-CM

## 2022-04-28 DIAGNOSIS — M542 Cervicalgia: Secondary | ICD-10-CM | POA: Diagnosis not present

## 2022-04-28 DIAGNOSIS — M064 Inflammatory polyarthropathy: Secondary | ICD-10-CM | POA: Diagnosis not present

## 2022-04-28 DIAGNOSIS — M25532 Pain in left wrist: Secondary | ICD-10-CM | POA: Diagnosis not present

## 2022-04-28 MED ORDER — GABAPENTIN 300 MG PO CAPS
ORAL_CAPSULE | ORAL | 2 refills | Status: DC
Start: 2022-04-28 — End: 2022-08-02

## 2022-04-28 MED ORDER — TRAMADOL HCL 50 MG PO TABS: 50.0000 mg | ORAL_TABLET | Freq: Four times a day (QID) | ORAL | 1 refills | Status: DC | PRN

## 2022-04-28 MED ORDER — DICLOFENAC SODIUM 75 MG PO TBEC: 75.0000 mg | DELAYED_RELEASE_TABLET | Freq: Two times a day (BID) | ORAL | 2 refills | Status: DC

## 2022-04-28 NOTE — Progress Notes (Signed)
[facility] [place], [place] [address]  Internal MEDICINE  Office Visit Note  Patient Name: Yvette Humphrey  [id number]  [id number]  Date of Service: 04/28/2022  Chief Complaint  Patient presents with   Follow-up    Follow up left hand and elbow hurting   Hypertension   Hyperlipidemia    HPI Yvette Humphrey presents for follow-up visit for chronic left elbow pain and left wrist pain. -- Was previously seen by orthopedic surgeon, nerve conduction testing has been done and she did not have carpal tunnel or any significant nerve damage.  -- She does have left-sided neck pain and left shoulder pain for a long time that has not been evaluated further -- There are no x-rays of her cervical spine or left shoulder -- Left arm pain has worsened, meloxicam did not help, asking to increase gabapentin dose and refill tramadol.  Nonpharmacologic interventions provide minimal relief.  Has not tried a wrist brace or splint     Current Medication: Outpatient Encounter Medications as of 04/28/2022  Medication Sig   ALPRAZolam (XANAX) 0.5 MG tablet TAKE 1 TABLET(0.5 MG) BY MOUTH DAILY AS NEEDED FOR ANXIETY   azelastine (ASTELIN) 0.1 % nasal spray Use 2 sq in each nostril every 6- 8 hrs qd   buPROPion (WELLBUTRIN) 75 MG tablet Take 1 tablet (75 mg total) by mouth 2 (two) times daily.   cetirizine (ZYRTEC) 10 MG tablet Take 10 mg by mouth daily.   cyanocobalamin 500 MCG tablet Take 500 mcg by mouth daily.   diclofenac (VOLTAREN) 75 MG EC tablet Take 1 tablet (75 mg total) by mouth 2 (two) times daily.   diphenoxylate-atropine (LOMOTIL) 2.5-0.025 MG tablet Take 1 tablet by mouth 4 (four) times daily as needed for diarrhea or loose stools.   fluconazole (DIFLUCAN) 150 MG tablet Take 1 tablet (150 mg total) by mouth once a week. For yeast infection prophylaxis while taking antibiotic.   fluticasone (FLONASE) 50 MCG/ACT nasal spray Place 2 sprays into both nostrils daily.   fluticasone  (FLOVENT HFA) 110 MCG/ACT inhaler Inhale 2 puffs into the lungs in the morning and at bedtime.   gabapentin (NEURONTIN) 100 MG capsule TAKE 2 CAPSULES BY MOUTH IN AM, TAKE 2 CAPSULES BY MOUTH IN AFTERNOON, TAKE 4 CAPSULES BY MOUTH IN PM   gabapentin (NEURONTIN) 300 MG capsule Take 1 capsule by mouth in the AM, 1 capsule by mouth in the PM and 2 capsule by mouth at bedtime   Lysine 1000 MG TABS Take by mouth daily as needed.   metoprolol succinate (TOPROL-XL) 50 MG 24 hr tablet TAKE 1 TABLET BY MOUTH EVERY NIGHT AT BEDTIME FOR BLOOD PRESSURE   montelukast (SINGULAIR) 10 MG tablet TAKE 1 TABLET(10 MG) BY MOUTH AT BEDTIME   ondansetron (ZOFRAN) 4 MG tablet Take 1 tablet (4 mg total) by mouth every 8 (eight) hours as needed for nausea or vomiting.   rosuvastatin (CRESTOR) 5 MG tablet TAKE 1 TABLET(5 MG) BY MOUTH DAILY   Semaglutide,0.25 or 0.'5MG'$ /DOS, 2 MG/3ML SOPN Inject 0.25 mg and .5 mg once a week (total .75 mg) under the skin for 5 weeks   sertraline (ZOLOFT) 100 MG tablet TAKE 1 TABLET(100 MG) BY MOUTH DAILY   traZODone (DESYREL) 100 MG tablet TAKE 1 TO 2 TABLETS(100 TO 200 MG) BY MOUTH AT BEDTIME   triamcinolone cream (KENALOG) 0.1 % Apply topically twice daily as needed for itching and rash   Ubrogepant (UBRELVY) 100 MG TABS Take 100 mg by  mouth daily as needed (acute migraine).   [DISCONTINUED] traMADol (ULTRAM) 50 MG tablet Take 1 tablet (50 mg total) by mouth every 6 (six) hours as needed for severe pain.   traMADol (ULTRAM) 50 MG tablet Take 1 tablet (50 mg total) by mouth every 6 (six) hours as needed for severe pain.   No facility-administered encounter medications on file as of 04/28/2022.    Surgical History: Past Surgical History:  Procedure Laterality Date   CHOLECYSTECTOMY     ENDOMETRIAL ABLATION     approx 2009   ESOPHAGOGASTRODUODENOSCOPY     REDUCTION MAMMAPLASTY Bilateral 2004   TONSILLECTOMY     TUBAL LIGATION     ULNAR NERVE TRANSPOSITION Left 06/08/2016   Procedure:  SUBCUTANEOUS ANTERIOR ULNAR NERVE TRANSPOSITION LEFT ELBOW;  Surgeon: Corky Mull, MD;  Location: [place];  Service: Orthopedics;  Laterality: Left;    Medical History: Past Medical History:  Diagnosis Date   Complication of anesthesia    Headache    migraines, 1x/mo   Hyperlipidemia    Hypertension    Motion sickness    back seat cars   PONV (postoperative nausea and vomiting)    Tachycardia    Wears contact lenses     Family History: Family History  Problem Relation Age of Onset   Hypertension Mother    Hyperlipidemia Mother    Hyperlipidemia Maternal Grandfather    COPD Maternal Grandfather    Breast cancer Paternal Grandmother     Social History   Socioeconomic History   Marital status: Married    Spouse name: Not on file   Number of children: Not on file   Years of education: Not on file   Highest education level: Not on file  Occupational History   Not on file  Tobacco Use   Smoking status: Never   Smokeless tobacco: Never  Vaping Use   Vaping Use: Never used  Substance and Sexual Activity   Alcohol use: Yes    Alcohol/week: 1.0 standard drink of alcohol    Types: 1 Glasses of wine per week    Comment: occasionally   Drug use: No   Sexual activity: Not on file  Other Topics Concern   Not on file  Social History Narrative   Not on file   Social Determinants of Health   Financial Resource Strain: Not on file  Food Insecurity: Not on file  Transportation Needs: Not on file  Physical Activity: Not on file  Stress: Not on file  Social Connections: Not on file  Intimate Partner Violence: Not on file      Review of Systems  Constitutional:  Positive for fatigue. Negative for chills and unexpected weight change.  HENT:  Negative for congestion, postnasal drip, rhinorrhea, sneezing and sore throat.   Eyes:  Negative for redness.  Respiratory: Negative.  Negative for cough, chest tightness, shortness of breath and wheezing.    Cardiovascular: Negative.  Negative for chest pain and palpitations.  Gastrointestinal:  Negative for abdominal pain, constipation, diarrhea, nausea and vomiting.  Genitourinary:  Negative for dysuria and frequency.  Musculoskeletal:  Positive for arthralgias, joint swelling and neck pain. Negative for back pain.  Skin:  Negative for rash.  Neurological: Negative.  Negative for tremors and numbness.  Hematological:  Negative for adenopathy. Does not bruise/bleed easily.  Psychiatric/Behavioral:  Negative for behavioral problems (Depression), sleep disturbance and suicidal ideas. The patient is not nervous/anxious.     Vital Signs: BP 119/71   Pulse 88  Temp (!) 97.5 F (36.4 C)   Resp 16   Ht '5\' 2"'$  (1.575 m)   Wt 172 lb 12.8 oz (78.4 kg)   SpO2 98%   BMI 31.61 kg/m    Physical Exam Vitals reviewed.  Constitutional:      General: She is not in acute distress.    Appearance: Normal appearance. She is obese. She is not ill-appearing.  HENT:     Head: Normocephalic and atraumatic.  Eyes:     Pupils: Pupils are equal, round, and reactive to light.  Neck:     Thyroid: No thyromegaly or thyroid tenderness.  Cardiovascular:     Rate and Rhythm: Normal rate and regular rhythm.  Pulmonary:     Effort: Pulmonary effort is normal. No respiratory distress.  Musculoskeletal:     Left shoulder: Tenderness present. Decreased range of motion.     Left elbow: Decreased range of motion.     Right wrist: Tenderness present. Decreased range of motion.     Left wrist: Swelling and tenderness present. Decreased range of motion.     Right hand: Swelling and tenderness present. Decreased range of motion.     Left hand: Swelling and tenderness present. Decreased range of motion. Decreased strength.     Cervical back: Muscular tenderness present. Decreased range of motion.  Lymphadenopathy:     Cervical:     Right cervical: No superficial, deep or posterior cervical adenopathy.    Left  cervical: No superficial, deep or posterior cervical adenopathy.  Neurological:     Mental Status: She is alert and oriented to person, place, and time.  Psychiatric:        Mood and Affect: Mood normal.        Behavior: Behavior normal.        Assessment/Plan: 1. Inflammatory polyarthropathy of multiple sites [facility]) Xrays ordered and prescription NSAID. Follow up in November -- call for earlier appointment pending xray results.  - diclofenac (VOLTAREN) 75 MG EC tablet; Take 1 tablet (75 mg total) by mouth 2 (two) times daily.  Dispense: 60 tablet; Refill: 2 - DG Cervical Spine Complete; Future - DG Shoulder Left; Future  2. Neck pain on left side Xray cervical spine to rule out any acute musculoskeletal abnormalities. Start oral diclofenac - diclofenac (VOLTAREN) 75 MG EC tablet; Take 1 tablet (75 mg total) by mouth 2 (two) times daily.  Dispense: 60 tablet; Refill: 2 - DG Cervical Spine Complete; Future  3. Chronic left shoulder pain Shoulder xray to rule out acute fracture, tear or other musculoskeletal abnormality, oral diclofenac added.  - diclofenac (VOLTAREN) 75 MG EC tablet; Take 1 tablet (75 mg total) by mouth 2 (two) times daily.  Dispense: 60 tablet; Refill: 2 - DG Shoulder Left; Future  4. Chronic pain of left wrist Gabapentin dose increased to control neuropathic pain, diclofenac added twice daily for pain and inflammation. Tramadol refilled for severe pain. Follow up in November.   - diclofenac (VOLTAREN) 75 MG EC tablet; Take 1 tablet (75 mg total) by mouth 2 (two) times daily.  Dispense: 60 tablet; Refill: 2 - gabapentin (NEURONTIN) 300 MG capsule; Take 1 capsule by mouth in the AM, 1 capsule by mouth in the PM and 2 capsule by mouth at bedtime  Dispense: 120 capsule; Refill: 2 - traMADol (ULTRAM) 50 MG tablet; Take 1 tablet (50 mg total) by mouth every 6 (six) hours as needed for severe pain.  Dispense: 60 tablet; Refill: 1   General Counseling: Yvette Humphrey  verbalizes  understanding of the findings of todays visit and agrees with plan of treatment. I have discussed any further diagnostic evaluation that may be needed or ordered today. We also reviewed her medications today. she has been encouraged to call the office with any questions or concerns that should arise related to todays visit.    Orders Placed This Encounter  Procedures   DG Cervical Spine Complete   DG Shoulder Left    Meds ordered this encounter  Medications   diclofenac (VOLTAREN) 75 MG EC tablet    Sig: Take 1 tablet (75 mg total) by mouth 2 (two) times daily.    Dispense:  60 tablet    Refill:  2   gabapentin (NEURONTIN) 300 MG capsule    Sig: Take 1 capsule by mouth in the AM, 1 capsule by mouth in the PM and 2 capsule by mouth at bedtime    Dispense:  120 capsule    Refill:  2   traMADol (ULTRAM) 50 MG tablet    Sig: Take 1 tablet (50 mg total) by mouth every 6 (six) hours as needed for severe pain.    Dispense:  60 tablet    Refill:  1    Return for previously scheduled, CPE, Yvette Humphrey PCP in november.   Total time spent:30 Minutes Time spent includes review of chart, medications, test results, and follow up plan with the patient.   [place] Controlled Substance Database was reviewed by me.  This patient was seen by Jonetta Osgood, FNP-C in collaboration with Dr. Clayborn Bigness as a part of collaborative care agreement.   Yvette Guion R. Valetta Fuller, MSN, FNP-C Internal medicine

## 2022-05-11 ENCOUNTER — Ambulatory Visit
Admission: RE | Admit: 2022-05-11 | Discharge: 2022-05-11 | Disposition: A | Discharge status: Home / Self Care | Payer: BC Managed Care – PPO | Attending: Nurse Practitioner | Admitting: Nurse Practitioner

## 2022-05-11 ENCOUNTER — Ambulatory Visit
Admission: RE | Admit: 2022-05-11 | Discharge: 2022-05-11 | Disposition: A | Discharge status: Home / Self Care | Payer: BC Managed Care – PPO | Source: Ambulatory Visit | Attending: Nurse Practitioner | Admitting: Nurse Practitioner

## 2022-05-11 DIAGNOSIS — G8929 Other chronic pain: Secondary | ICD-10-CM | POA: Insufficient documentation

## 2022-05-11 DIAGNOSIS — M25512 Pain in left shoulder: Secondary | ICD-10-CM | POA: Diagnosis present

## 2022-05-11 DIAGNOSIS — M064 Inflammatory polyarthropathy: Secondary | ICD-10-CM

## 2022-05-11 DIAGNOSIS — M542 Cervicalgia: Secondary | ICD-10-CM | POA: Diagnosis present

## 2022-05-23 ENCOUNTER — Encounter: Payer: Self-pay | Admitting: Nurse Practitioner

## 2022-06-01 ENCOUNTER — Encounter: Payer: BC Managed Care – PPO | Admitting: Nurse Practitioner

## 2022-06-16 LAB — CMP14+EGFR
ALT: 28 IU/L (ref 0–32)
AST: 21 IU/L (ref 0–40)
Albumin/Globulin Ratio: 2.4 — ABNORMAL HIGH (ref 1.2–2.2)
Albumin: 5.2 g/dL — ABNORMAL HIGH (ref 3.9–4.9)
Alkaline Phosphatase: 53 IU/L (ref 44–121)
BUN/Creatinine Ratio: 20 (ref 9–23)
BUN: 16 mg/dL (ref 6–24)
Bilirubin Total: 0.3 mg/dL (ref 0.0–1.2)
CO2: 21 mmol/L (ref 20–29)
Calcium: 9.8 mg/dL (ref 8.7–10.2)
Chloride: 103 mmol/L (ref 96–106)
Creatinine, Ser: 0.8 mg/dL (ref 0.57–1.00)
Globulin, Total: 2.2 g/dL (ref 1.5–4.5)
Glucose: 96 mg/dL (ref 70–99)
Potassium: 4.5 mmol/L (ref 3.5–5.2)
Sodium: 140 mmol/L (ref 134–144)
Total Protein: 7.4 g/dL (ref 6.0–8.5)
eGFR: 91 mL/min/{1.73_m2} (ref >59)

## 2022-06-16 LAB — LIPID PANEL
Chol/HDL Ratio: 3.4 ratio (ref 0.0–4.4)
Cholesterol, Total: 169 mg/dL (ref 100–199)
HDL: 49 mg/dL (ref >39)
LDL Chol Calc (NIH): 85 mg/dL (ref 0–99)
Triglycerides: 212 mg/dL — ABNORMAL HIGH (ref 0–149)
VLDL Cholesterol Cal: 35 mg/dL (ref 5–40)

## 2022-06-17 ENCOUNTER — Encounter: Payer: BC Managed Care – PPO | Admitting: Nurse Practitioner

## 2022-06-20 ENCOUNTER — Encounter: Payer: Self-pay | Admitting: Nurse Practitioner

## 2022-06-20 ENCOUNTER — Ambulatory Visit (INDEPENDENT_AMBULATORY_CARE_PROVIDER_SITE_OTHER): Payer: BC Managed Care – PPO | Admitting: Nurse Practitioner

## 2022-06-20 VITALS — BP 131/75 | HR 84 | Temp 98.1°F | Resp 16 | Ht 62.0 in | Wt 176.4 lb

## 2022-06-20 DIAGNOSIS — E6609 Other obesity due to excess calories: Secondary | ICD-10-CM | POA: Diagnosis not present

## 2022-06-20 DIAGNOSIS — Z760 Encounter for issue of repeat prescription: Secondary | ICD-10-CM

## 2022-06-20 DIAGNOSIS — I10 Essential (primary) hypertension: Secondary | ICD-10-CM | POA: Diagnosis not present

## 2022-06-20 DIAGNOSIS — Z0001 Encounter for general adult medical examination with abnormal findings: Secondary | ICD-10-CM

## 2022-06-20 DIAGNOSIS — Z6832 Body mass index (BMI) 32.0-32.9, adult: Secondary | ICD-10-CM

## 2022-06-20 MED ORDER — TRAZODONE HCL 100 MG PO TABS: ORAL_TABLET | ORAL | 1 refills | Status: DC

## 2022-06-20 MED ORDER — ALPRAZOLAM 0.5 MG PO TABS: ORAL_TABLET | ORAL | 2 refills | Status: DC

## 2022-06-20 MED ORDER — SEMAGLUTIDE (1 MG/DOSE) 4 MG/3ML ~~LOC~~ SOPN: 1.0000 mg | PEN_INJECTOR | SUBCUTANEOUS | 2 refills | Status: DC

## 2022-06-20 MED ORDER — SERTRALINE HCL 100 MG PO TABS: ORAL_TABLET | ORAL | 1 refills | Status: DC

## 2022-06-20 MED ORDER — BUPROPION HCL 75 MG PO TABS: 75.0000 mg | ORAL_TABLET | Freq: Two times a day (BID) | ORAL | 1 refills | Status: DC

## 2022-06-20 MED ORDER — METOPROLOL SUCCINATE ER 50 MG PO TB24: ORAL_TABLET | ORAL | 1 refills | Status: DC

## 2022-06-20 NOTE — Progress Notes (Signed)
[facility], [place] [address]  Internal MEDICINE  Office Visit Note  Patient Name: Yvette Humphrey  [id number]  [id number]  Date of Service: 06/20/2022  Chief Complaint  Patient presents with   Annual Exam   Hypertension   Hyperlipidemia    HPI Yvette Humphrey presents for an annual well visit and physical exam.  Well-appearing 46 year old female with hypertension, migraines, inflammatory polyarthropathy, GAD, insomnia, depression --labs -- lipid panel has improved further, all levels were normal except triglycerides which improved to 212. Glucose is normal. Albumin is slightly elevated but ok right now.  --xray cervical spine showed degenerative changes, narrowing, bone spurs and left shoulder showed degenerative changes of the AC joint. Preventive screenings are up to date.  --some refills due now. Wants to increase ozempic dose to 1 mg weekly.      Current Medication: Outpatient Encounter Medications as of 06/20/2022  Medication Sig   azelastine (ASTELIN) 0.1 % nasal spray Use 2 sq in each nostril every 6- 8 hrs qd   cetirizine (ZYRTEC) 10 MG tablet Take 10 mg by mouth daily.   cyanocobalamin 500 MCG tablet Take 500 mcg by mouth daily.   diclofenac (VOLTAREN) 75 MG EC tablet Take 1 tablet (75 mg total) by mouth 2 (two) times daily.   diphenoxylate-atropine (LOMOTIL) 2.5-0.025 MG tablet Take 1 tablet by mouth 4 (four) times daily as needed for diarrhea or loose stools.   fluconazole (DIFLUCAN) 150 MG tablet Take 1 tablet (150 mg total) by mouth once a week. For yeast infection prophylaxis while taking antibiotic.   fluticasone (FLONASE) 50 MCG/ACT nasal spray Place 2 sprays into both nostrils daily.   fluticasone (FLOVENT HFA) 110 MCG/ACT inhaler Inhale 2 puffs into the lungs in the morning and at bedtime.   gabapentin (NEURONTIN) 100 MG capsule TAKE 2 CAPSULES BY MOUTH IN AM, TAKE 2 CAPSULES BY MOUTH IN AFTERNOON, TAKE 4 CAPSULES BY MOUTH IN PM    gabapentin (NEURONTIN) 300 MG capsule Take 1 capsule by mouth in the AM, 1 capsule by mouth in the PM and 2 capsule by mouth at bedtime   Lysine 1000 MG TABS Take by mouth daily as needed.   montelukast (SINGULAIR) 10 MG tablet TAKE 1 TABLET(10 MG) BY MOUTH AT BEDTIME   ondansetron (ZOFRAN) 4 MG tablet Take 1 tablet (4 mg total) by mouth every 8 (eight) hours as needed for nausea or vomiting.   rosuvastatin (CRESTOR) 5 MG tablet TAKE 1 TABLET(5 MG) BY MOUTH DAILY   Semaglutide, 1 MG/DOSE, 4 MG/3ML SOPN Inject 1 mg as directed once a week.   traMADol (ULTRAM) 50 MG tablet Take 1 tablet (50 mg total) by mouth every 6 (six) hours as needed for severe pain.   triamcinolone cream (KENALOG) 0.1 % Apply topically twice daily as needed for itching and rash   Ubrogepant (UBRELVY) 100 MG TABS Take 100 mg by mouth daily as needed (acute migraine).   [DISCONTINUED] ALPRAZolam (XANAX) 0.5 MG tablet TAKE 1 TABLET(0.5 MG) BY MOUTH DAILY AS NEEDED FOR ANXIETY   [DISCONTINUED] buPROPion (WELLBUTRIN) 75 MG tablet Take 1 tablet (75 mg total) by mouth 2 (two) times daily.   [DISCONTINUED] metoprolol succinate (TOPROL-XL) 50 MG 24 hr tablet TAKE 1 TABLET BY MOUTH EVERY NIGHT AT BEDTIME FOR BLOOD PRESSURE   [DISCONTINUED] Semaglutide,0.25 or 0.'5MG'$ /DOS, 2 MG/3ML SOPN Inject 0.25 mg and .5 mg once a week (total .75 mg) under the skin for 5 weeks   [DISCONTINUED] sertraline (ZOLOFT)  100 MG tablet TAKE 1 TABLET(100 MG) BY MOUTH DAILY   [DISCONTINUED] traZODone (DESYREL) 100 MG tablet TAKE 1 TO 2 TABLETS(100 TO 200 MG) BY MOUTH AT BEDTIME   ALPRAZolam (XANAX) 0.5 MG tablet TAKE 1 TABLET(0.5 MG) BY MOUTH DAILY AS NEEDED FOR ANXIETY   buPROPion (WELLBUTRIN) 75 MG tablet Take 1 tablet (75 mg total) by mouth 2 (two) times daily.   metoprolol succinate (TOPROL-XL) 50 MG 24 hr tablet TAKE 1 TABLET BY MOUTH EVERY NIGHT AT BEDTIME FOR BLOOD PRESSURE   sertraline (ZOLOFT) 100 MG tablet TAKE 1 TABLET(100 MG) BY MOUTH DAILY    traZODone (DESYREL) 100 MG tablet TAKE 1 TO 2 TABLETS(100 TO 200 MG) BY MOUTH AT BEDTIME   No facility-administered encounter medications on file as of 06/20/2022.    Surgical History: Past Surgical History:  Procedure Laterality Date   CHOLECYSTECTOMY     ENDOMETRIAL ABLATION     approx 2009   ESOPHAGOGASTRODUODENOSCOPY     REDUCTION MAMMAPLASTY Bilateral 2004   TONSILLECTOMY     TUBAL LIGATION     ULNAR NERVE TRANSPOSITION Left 06/08/2016   Procedure: SUBCUTANEOUS ANTERIOR ULNAR NERVE TRANSPOSITION LEFT ELBOW;  Surgeon: Corky Mull, MD;  Location: [facility];  Service: Orthopedics;  Laterality: Left;    Medical History: Past Medical History:  Diagnosis Date   Complication of anesthesia    Headache    migraines, 1x/mo   Hyperlipidemia    Hypertension    Motion sickness    back seat cars   PONV (postoperative nausea and vomiting)    Tachycardia    Wears contact lenses     Family History: Family History  Problem Relation Age of Onset   Hypertension Mother    Hyperlipidemia Mother    Hyperlipidemia Maternal Grandfather    COPD Maternal Grandfather    Breast cancer Paternal Grandmother     Social History   Socioeconomic History   Marital status: Married    Spouse name: Not on file   Number of children: Not on file   Years of education: Not on file   Highest education level: Not on file  Occupational History   Not on file  Tobacco Use   Smoking status: Never   Smokeless tobacco: Never  Vaping Use   Vaping Use: Never used  Substance and Sexual Activity   Alcohol use: Yes    Alcohol/week: 1.0 standard drink of alcohol    Types: 1 Glasses of wine per week    Comment: occasionally   Drug use: No   Sexual activity: Not on file  Other Topics Concern   Not on file  Social History Narrative   Not on file   Social Determinants of Health   Financial Resource Strain: Not on file  Food Insecurity: Not on file  Transportation Needs: Not on file   Physical Activity: Not on file  Stress: Not on file  Social Connections: Not on file  Intimate Partner Violence: Not on file      Review of Systems  Constitutional:  Negative for activity change, appetite change, chills, fatigue, fever and unexpected weight change.  HENT: Negative.  Negative for congestion, ear pain, rhinorrhea, sore throat and trouble swallowing.   Eyes: Negative.   Respiratory: Negative.  Negative for cough, chest tightness, shortness of breath and wheezing.   Cardiovascular: Negative.  Negative for chest pain.  Gastrointestinal: Negative.  Negative for abdominal pain, blood in stool, constipation, diarrhea, nausea and vomiting.  Endocrine: Negative.   Genitourinary:  Negative.  Negative for difficulty urinating, dysuria, frequency, hematuria and urgency.  Musculoskeletal: Negative.  Negative for arthralgias, back pain, joint swelling, myalgias and neck pain.  Skin: Negative.  Negative for rash and wound.  Allergic/Immunologic: Negative.  Negative for immunocompromised state.  Neurological: Negative.  Negative for dizziness, seizures, numbness and headaches.  Hematological: Negative.   Psychiatric/Behavioral: Negative.  Negative for behavioral problems, self-injury and suicidal ideas. The patient is not nervous/anxious.     Vital Signs: BP 131/75   Pulse 84   Temp 98.1 F (36.7 C)   Resp 16   Ht '5\' 2"'$  (1.575 m)   Wt 176 lb 6.4 oz (80 kg)   SpO2 98%   BMI 32.26 kg/m    Physical Exam Vitals reviewed.  Constitutional:      General: She is awake. She is not in acute distress.    Appearance: Normal appearance. She is well-developed and well-groomed. She is obese. She is not ill-appearing or diaphoretic.  HENT:     Head: Normocephalic and atraumatic.     Right Ear: Tympanic membrane, ear canal and external ear normal.     Left Ear: Tympanic membrane, ear canal and external ear normal.     Nose: Nose normal. No congestion or rhinorrhea.     Mouth/Throat:      Lips: Pink.     Mouth: Mucous membranes are moist.     Pharynx: Oropharynx is clear. Uvula midline. No oropharyngeal exudate or posterior oropharyngeal erythema.  Eyes:     General: Lids are normal. Vision grossly intact. Gaze aligned appropriately. No scleral icterus.       Right eye: No discharge.        Left eye: No discharge.     Extraocular Movements: Extraocular movements intact.     Conjunctiva/sclera: Conjunctivae normal.     Pupils: Pupils are equal, round, and reactive to light.     Funduscopic exam:    Right eye: Red reflex present.        Left eye: Red reflex present. Neck:     Thyroid: No thyromegaly.     Vascular: No carotid bruit or JVD.     Trachea: Trachea and phonation normal. No tracheal deviation.  Cardiovascular:     Rate and Rhythm: Normal rate and regular rhythm.     Pulses: Normal pulses.     Heart sounds: Normal heart sounds, S1 normal and S2 normal. No murmur heard.    No friction rub. No gallop.  Pulmonary:     Effort: Pulmonary effort is normal. No accessory muscle usage or respiratory distress.     Breath sounds: Normal breath sounds and air entry. No stridor. No wheezing or rales.  Chest:     Chest wall: No tenderness.     Comments: Declined clinical breast exam, gets annual mammograms.  Abdominal:     General: Bowel sounds are normal. There is no distension.     Palpations: Abdomen is soft. There is no shifting dullness, fluid wave, mass or pulsatile mass.     Tenderness: There is no abdominal tenderness. There is no guarding or rebound.  Musculoskeletal:        General: No tenderness or deformity. Normal range of motion.     Cervical back: Normal range of motion and neck supple.  Lymphadenopathy:     Cervical: No cervical adenopathy.  Skin:    General: Skin is warm and dry.     Capillary Refill: Capillary refill takes less than 2 seconds.  Coloration: Skin is not pale.     Findings: No erythema or rash.  Neurological:     Mental  Status: She is alert and oriented to person, place, and time.     Cranial Nerves: No cranial nerve deficit.     Motor: No abnormal muscle tone.     Coordination: Coordination normal.     Gait: Gait normal.     Deep Tendon Reflexes: Reflexes are normal and symmetric.  Psychiatric:        Mood and Affect: Mood and affect normal.        Behavior: Behavior normal. Behavior is cooperative.        Thought Content: Thought content normal.        Judgment: Judgment normal.       Assessment/Plan: 1. Encounter for general adult medical examination with abnormal findings Age-appropriate preventive screenings and vaccinations discussed, annual physical exam completed. Routine labs for health maintenance drawn prior to office visit, results discussed today. PHM updated.   2. Essential (primary) hypertension Stable, continue medications as prescribed.   3. Class 1 obesity due to excess calories without serious comorbidity with body mass index (BMI) of 32.0 to 32.9 in adult Ozempic dose increased, will see if this will  - Semaglutide, 1 MG/DOSE, 4 MG/3ML SOPN; Inject 1 mg as directed once a week.  Dispense: 3 mL; Refill: 2  4. Medication refill - Semaglutide, 1 MG/DOSE, 4 MG/3ML SOPN; Inject 1 mg as directed once a week.  Dispense: 3 mL; Refill: 2 - ALPRAZolam (XANAX) 0.5 MG tablet; TAKE 1 TABLET(0.5 MG) BY MOUTH DAILY AS NEEDED FOR ANXIETY  Dispense: 30 tablet; Refill: 2 - buPROPion (WELLBUTRIN) 75 MG tablet; Take 1 tablet (75 mg total) by mouth 2 (two) times daily.  Dispense: 180 tablet; Refill: 1 - sertraline (ZOLOFT) 100 MG tablet; TAKE 1 TABLET(100 MG) BY MOUTH DAILY  Dispense: 90 tablet; Refill: 1 - traZODone (DESYREL) 100 MG tablet; TAKE 1 TO 2 TABLETS(100 TO 200 MG) BY MOUTH AT BEDTIME  Dispense: 180 tablet; Refill: 1 - metoprolol succinate (TOPROL-XL) 50 MG 24 hr tablet; TAKE 1 TABLET BY MOUTH EVERY NIGHT AT BEDTIME FOR BLOOD PRESSURE  Dispense: 90 tablet; Refill: 1     General  Counseling: chela sutphen understanding of the findings of todays visit and agrees with plan of treatment. I have discussed any further diagnostic evaluation that may be needed or ordered today. We also reviewed her medications today. she has been encouraged to call the office with any questions or concerns that should arise related to todays visit.    No orders of the defined types were placed in this encounter.   Meds ordered this encounter  Medications   Semaglutide, 1 MG/DOSE, 4 MG/3ML SOPN    Sig: Inject 1 mg as directed once a week.    Dispense:  3 mL    Refill:  2    Note increased dose, please fill today, discontinue all previous orders for semaglutide   ALPRAZolam (XANAX) 0.5 MG tablet    Sig: TAKE 1 TABLET(0.5 MG) BY MOUTH DAILY AS NEEDED FOR ANXIETY    Dispense:  30 tablet    Refill:  2   buPROPion (WELLBUTRIN) 75 MG tablet    Sig: Take 1 tablet (75 mg total) by mouth 2 (two) times daily.    Dispense:  180 tablet    Refill:  1   sertraline (ZOLOFT) 100 MG tablet    Sig: TAKE 1 TABLET(100 MG) BY MOUTH DAILY  Dispense:  90 tablet    Refill:  1   traZODone (DESYREL) 100 MG tablet    Sig: TAKE 1 TO 2 TABLETS(100 TO 200 MG) BY MOUTH AT BEDTIME    Dispense:  180 tablet    Refill:  1   metoprolol succinate (TOPROL-XL) 50 MG 24 hr tablet    Sig: TAKE 1 TABLET BY MOUTH EVERY NIGHT AT BEDTIME FOR BLOOD PRESSURE    Dispense:  90 tablet    Refill:  1    For future refills    Return in about 2 months (around 08/20/2022) for F/U, Weight loss, Trenity Pha PCP.   Total time spent:30 Minutes Time spent includes review of chart, medications, test results, and follow up plan with the patient.   [place] Controlled Substance Database was reviewed by me.  This patient was seen by Jonetta Osgood, FNP-C in collaboration with Dr. Clayborn Bigness as a part of collaborative care agreement.  Lizzette Carbonell R. Valetta Fuller, MSN, FNP-C Internal medicine

## 2022-06-23 ENCOUNTER — Encounter: Payer: Self-pay | Admitting: Nurse Practitioner

## 2022-06-23 ENCOUNTER — Other Ambulatory Visit: Payer: Self-pay | Admitting: Internal Medicine

## 2022-06-23 MED ORDER — OMEPRAZOLE 20 MG PO CPDR: 20.0000 mg | DELAYED_RELEASE_CAPSULE | Freq: Every day | ORAL | 3 refills | Status: DC

## 2022-07-05 DIAGNOSIS — M533 Sacrococcygeal disorders, not elsewhere classified: Secondary | ICD-10-CM | POA: Insufficient documentation

## 2022-07-05 DIAGNOSIS — S39012A Strain of muscle, fascia and tendon of lower back, initial encounter: Secondary | ICD-10-CM | POA: Insufficient documentation

## 2022-07-05 DIAGNOSIS — M545 Low back pain, unspecified: Secondary | ICD-10-CM | POA: Insufficient documentation

## 2022-07-05 HISTORY — DX: Low back pain, unspecified: M54.50

## 2022-07-05 HISTORY — DX: Strain of muscle, fascia and tendon of lower back, initial encounter: S39.012A

## 2022-07-05 HISTORY — DX: Sacrococcygeal disorders, not elsewhere classified: M53.3

## 2022-07-16 IMAGING — CR DG HIP (WITH OR WITHOUT PELVIS) 2-3V*L*
1 series · 3 of 3 positions shown · non-contrast
Comparison: None.

CLINICAL DATA: Acute left hip pain for 1 month. No reported injury.

EXAM:
DG HIP (WITH OR WITHOUT PELVIS) 2-3V LEFT

[Series 1: dg hip unilat w or w/o pelvis 2-3 views  · non-contrast · 0.14mm/px · 3 of 3 slices shown]
[im 1/3]
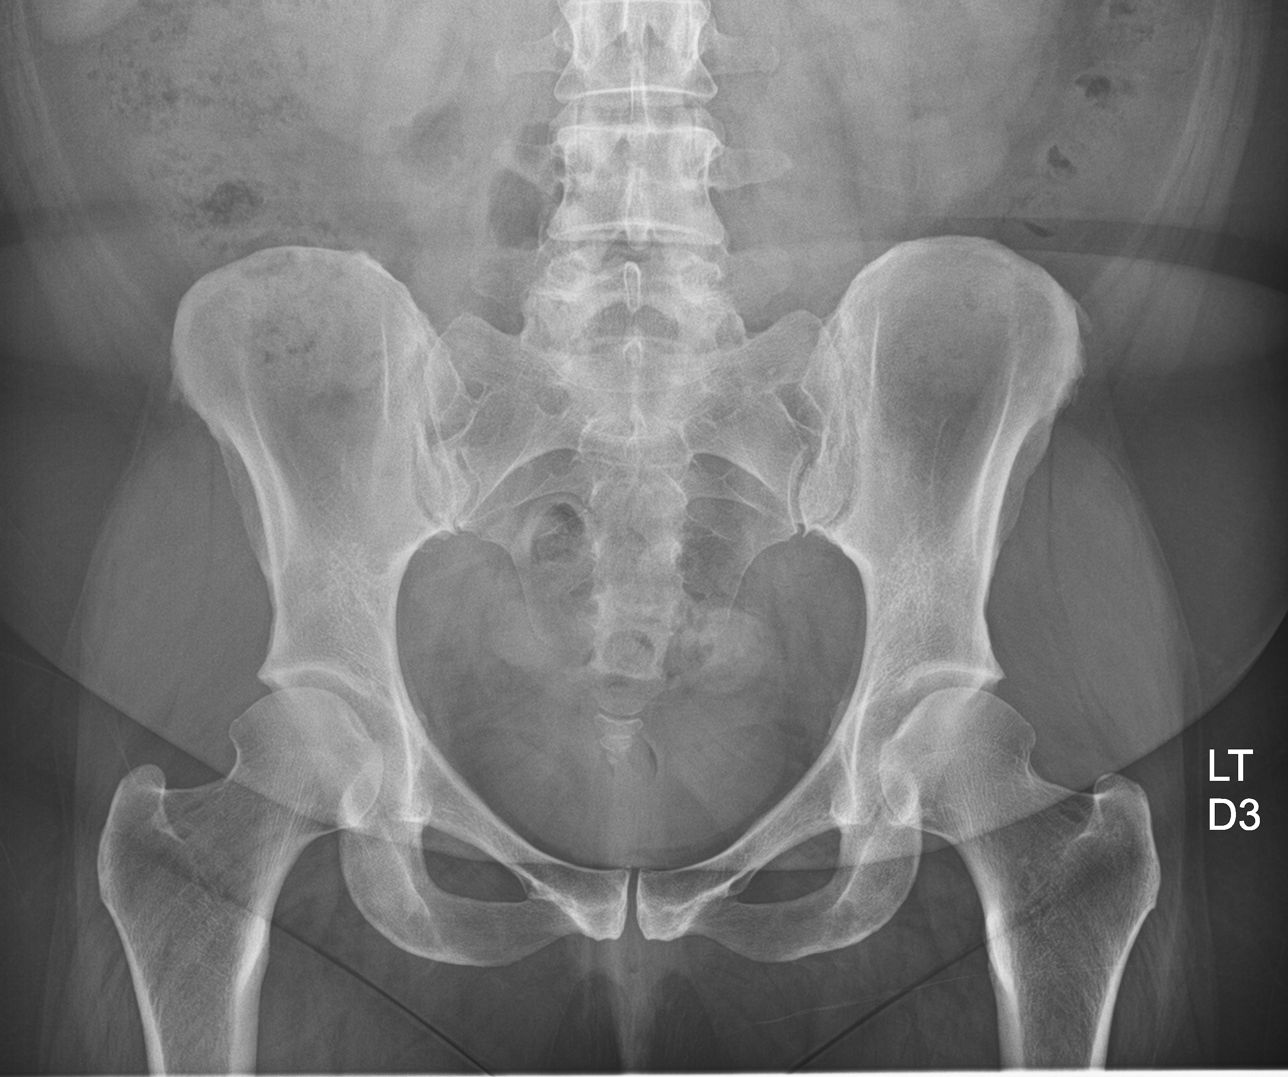
[im 2/3]
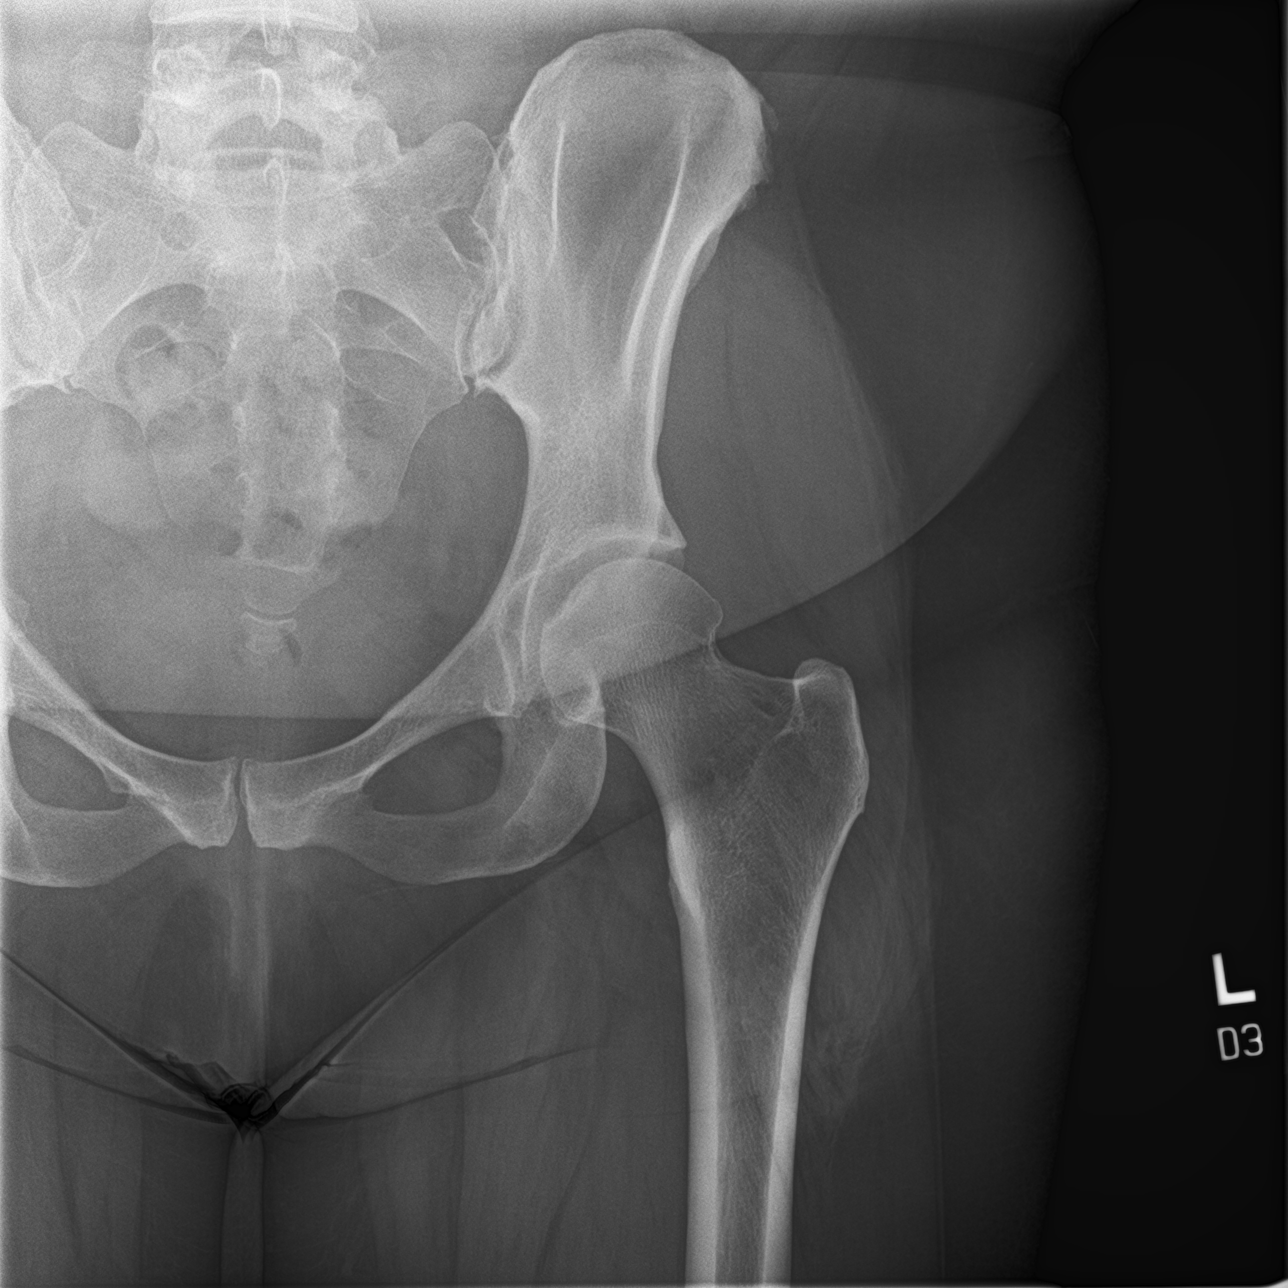
[im 3/3]
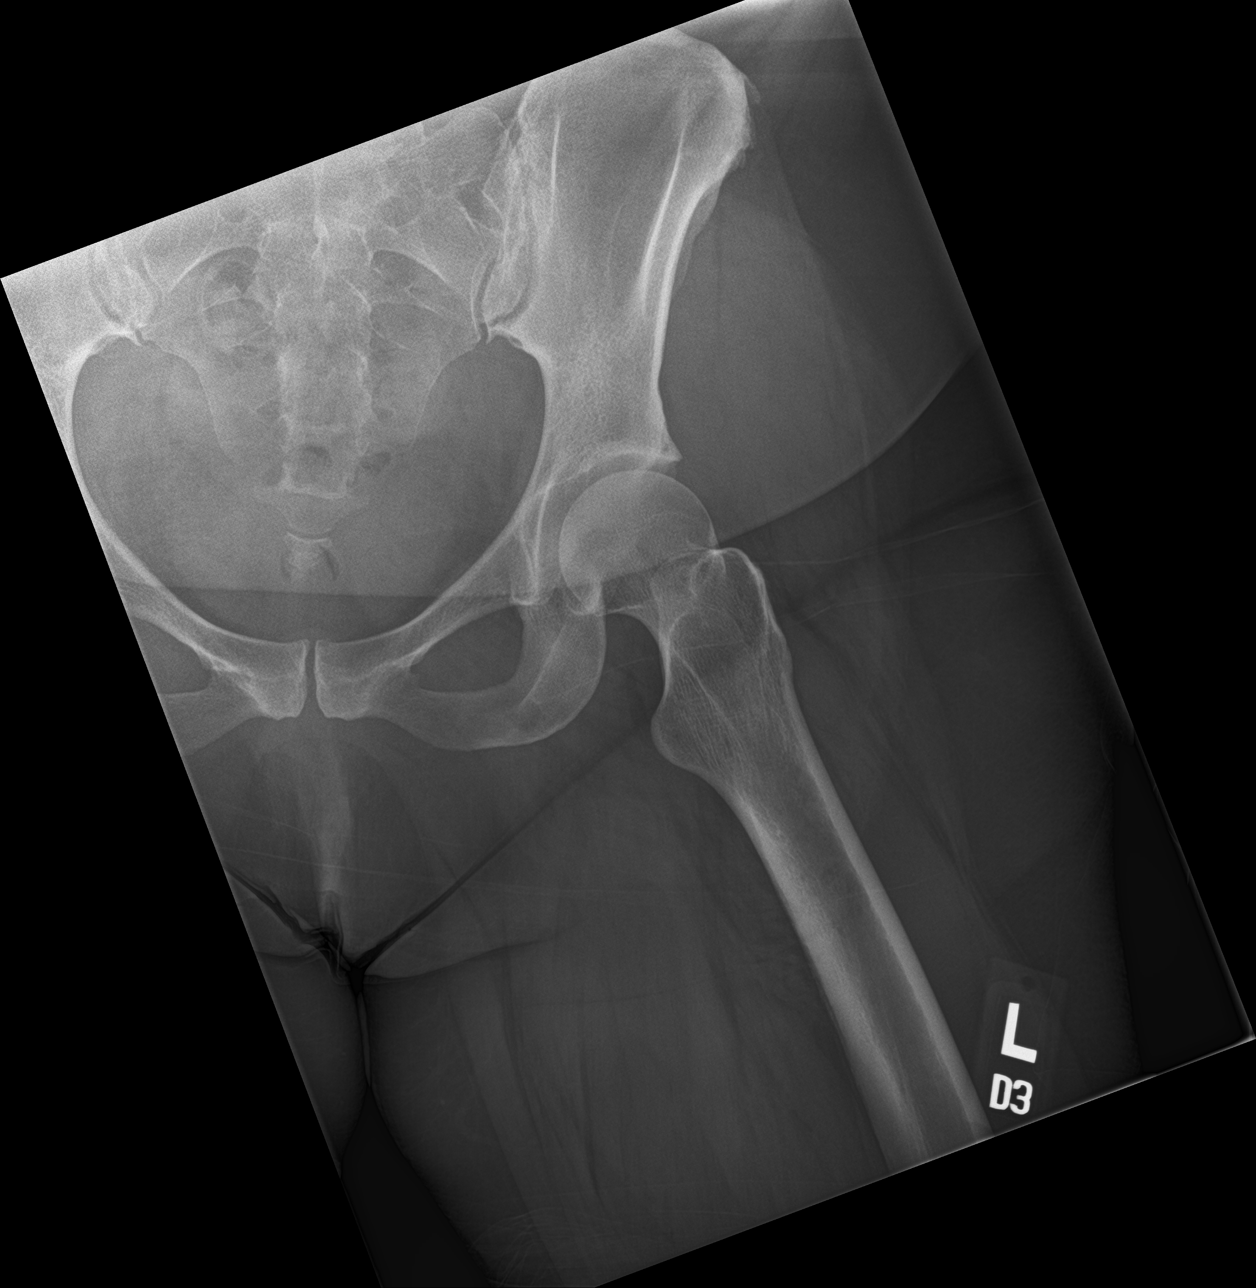

[3 of 3 positions shown; findings below may reference images not displayed]

FINDINGS: No pelvic fracture or diastasis. No left hip fracture or
dislocation. No significant left hip arthropathy. No suspicious
focal osseous lesions. No radiopaque foreign bodies.
IMPRESSION: No acute osseous abnormality.  No significant left hip arthropathy.

## 2022-08-01 ENCOUNTER — Other Ambulatory Visit: Payer: Self-pay | Admitting: Nurse Practitioner

## 2022-08-01 DIAGNOSIS — B3731 Acute candidiasis of vulva and vagina: Secondary | ICD-10-CM

## 2022-08-02 ENCOUNTER — Encounter: Payer: Self-pay | Admitting: Nurse Practitioner

## 2022-08-02 ENCOUNTER — Other Ambulatory Visit: Payer: Self-pay | Admitting: Nurse Practitioner

## 2022-08-02 DIAGNOSIS — B3731 Acute candidiasis of vulva and vagina: Secondary | ICD-10-CM

## 2022-08-02 DIAGNOSIS — G8929 Other chronic pain: Secondary | ICD-10-CM

## 2022-08-02 MED ORDER — FLUCONAZOLE 150 MG PO TABS: 150.0000 mg | ORAL_TABLET | ORAL | 0 refills | Status: DC

## 2022-08-11 ENCOUNTER — Other Ambulatory Visit: Payer: Self-pay | Admitting: Nurse Practitioner

## 2022-08-11 DIAGNOSIS — G8929 Other chronic pain: Secondary | ICD-10-CM

## 2022-08-11 DIAGNOSIS — M542 Cervicalgia: Secondary | ICD-10-CM

## 2022-08-11 DIAGNOSIS — M064 Inflammatory polyarthropathy: Secondary | ICD-10-CM

## 2022-08-23 ENCOUNTER — Encounter: Payer: Self-pay | Admitting: Nurse Practitioner

## 2022-08-23 ENCOUNTER — Other Ambulatory Visit: Payer: Self-pay

## 2022-08-23 ENCOUNTER — Ambulatory Visit (INDEPENDENT_AMBULATORY_CARE_PROVIDER_SITE_OTHER): Payer: BC Managed Care – PPO | Admitting: Nurse Practitioner

## 2022-08-23 VITALS — BP 114/74 | HR 82 | Temp 97.6°F | Resp 16 | Ht 62.0 in | Wt 177.4 lb

## 2022-08-23 DIAGNOSIS — E6609 Other obesity due to excess calories: Secondary | ICD-10-CM

## 2022-08-23 DIAGNOSIS — R002 Palpitations: Secondary | ICD-10-CM | POA: Diagnosis not present

## 2022-08-23 DIAGNOSIS — Z6832 Body mass index (BMI) 32.0-32.9, adult: Secondary | ICD-10-CM

## 2022-08-23 DIAGNOSIS — I10 Essential (primary) hypertension: Secondary | ICD-10-CM

## 2022-08-23 MED ORDER — WEGOVY 1.7 MG/0.75ML ~~LOC~~ SOAJ: 1.7000 mg | SUBCUTANEOUS | 2 refills | Status: DC

## 2022-08-23 NOTE — Progress Notes (Signed)
[facility], [place] [address]  Internal MEDICINE  Office Visit Note  Patient Name: Yvette Humphrey  [id number]  [id number]  Date of Service: 08/23/2022  Chief Complaint  Patient presents with   Follow-up    Weight loss     HPI [name] presents for a follow-up visit for palpitations and weight loss management Palpitations are happening more frequently and for longer amount of time. Wore a cardiac monitor about 9 years ago. And was evaluated to rapid heart rate then but nothing significant was found.  Weight loss management -- wants to switch to wegovy at a higher dose. Has been on ozempic 1 mg weekly.  Hypertension -- BP is stable on current medications    Current Medication: Outpatient Encounter Medications as of 08/23/2022  Medication Sig   ALPRAZolam (XANAX) 0.5 MG tablet TAKE 1 TABLET(0.5 MG) BY MOUTH DAILY AS NEEDED FOR ANXIETY   azelastine (ASTELIN) 0.1 % nasal spray Use 2 sq in each nostril every 6- 8 hrs qd   buPROPion (WELLBUTRIN) 75 MG tablet Take 1 tablet (75 mg total) by mouth 2 (two) times daily.   cetirizine (ZYRTEC) 10 MG tablet Take 10 mg by mouth daily.   cyanocobalamin 500 MCG tablet Take 500 mcg by mouth daily.   diclofenac (VOLTAREN) 75 MG EC tablet TAKE 1 TABLET(75 MG) BY MOUTH TWICE DAILY   diphenoxylate-atropine (LOMOTIL) 2.5-0.025 MG tablet Take 1 tablet by mouth 4 (four) times daily as needed for diarrhea or loose stools.   fluconazole (DIFLUCAN) 150 MG tablet Take 1 tablet (150 mg total) by mouth once a week. For yeast infection prophylaxis while taking antibiotic.   fluticasone (FLONASE) 50 MCG/ACT nasal spray Place 2 sprays into both nostrils daily.   fluticasone (FLOVENT HFA) 110 MCG/ACT inhaler Inhale 2 puffs into the lungs in the morning and at bedtime.   gabapentin (NEURONTIN) 100 MG capsule TAKE 2 CAPSULES BY MOUTH IN AM, TAKE 2 CAPSULES BY MOUTH IN AFTERNOON, TAKE 4 CAPSULES BY MOUTH IN PM   gabapentin (NEURONTIN)  300 MG capsule TAKE 1 CAPSULE BY MOUTH EVERY MORNING 1 EVERY EVENING AND 2 CAPSULES AT BEDTIME   Lysine 1000 MG TABS Take by mouth daily as needed.   metoprolol succinate (TOPROL-XL) 50 MG 24 hr tablet TAKE 1 TABLET BY MOUTH EVERY NIGHT AT BEDTIME FOR BLOOD PRESSURE   montelukast (SINGULAIR) 10 MG tablet TAKE 1 TABLET(10 MG) BY MOUTH AT BEDTIME   omeprazole (PRILOSEC) 20 MG capsule Take 1 capsule (20 mg total) by mouth daily.   ondansetron (ZOFRAN) 4 MG tablet Take 1 tablet (4 mg total) by mouth every 8 (eight) hours as needed for nausea or vomiting.   rosuvastatin (CRESTOR) 5 MG tablet TAKE 1 TABLET(5 MG) BY MOUTH DAILY   Semaglutide, 1 MG/DOSE, 4 MG/3ML SOPN Inject 1 mg as directed once a week.   Semaglutide-Weight Management (WEGOVY) 1.7 MG/0.75ML SOAJ Inject 1.7 mg into the skin once a week.   sertraline (ZOLOFT) 100 MG tablet TAKE 1 TABLET(100 MG) BY MOUTH DAILY   traMADol (ULTRAM) 50 MG tablet Take 1 tablet (50 mg total) by mouth every 6 (six) hours as needed for severe pain.   traZODone (DESYREL) 100 MG tablet TAKE 1 TO 2 TABLETS(100 TO 200 MG) BY MOUTH AT BEDTIME   triamcinolone cream (KENALOG) 0.1 % Apply topically twice daily as needed for itching and rash   Ubrogepant (UBRELVY) 100 MG TABS Take 100 mg by mouth daily as needed (acute migraine).  No facility-administered encounter medications on file as of 08/23/2022.    Surgical History: Past Surgical History:  Procedure Laterality Date   CHOLECYSTECTOMY     ENDOMETRIAL ABLATION     approx 2009   ESOPHAGOGASTRODUODENOSCOPY     REDUCTION MAMMAPLASTY Bilateral 2004   TONSILLECTOMY     TUBAL LIGATION     ULNAR NERVE TRANSPOSITION Left 06/08/2016   Procedure: SUBCUTANEOUS ANTERIOR ULNAR NERVE TRANSPOSITION LEFT ELBOW;  Surgeon: Corky Mull, MD;  Location: [facility];  Service: Orthopedics;  Laterality: Left;    Medical History: Past Medical History:  Diagnosis Date   Complication of anesthesia    Headache     migraines, 1x/mo   Hyperlipidemia    Hypertension    Motion sickness    back seat cars   PONV (postoperative nausea and vomiting)    Tachycardia    Wears contact lenses     Family History: Family History  Problem Relation Age of Onset   Hypertension Mother    Hyperlipidemia Mother    Hyperlipidemia Maternal Grandfather    COPD Maternal Grandfather    Breast cancer Paternal Grandmother     Social History   Socioeconomic History   Marital status: Married    Spouse name: Not on file   Number of children: Not on file   Years of education: Not on file   Highest education level: Not on file  Occupational History   Not on file  Tobacco Use   Smoking status: Never   Smokeless tobacco: Never  Vaping Use   Vaping Use: Never used  Substance and Sexual Activity   Alcohol use: Yes    Alcohol/week: 1.0 standard drink of alcohol    Types: 1 Glasses of wine per week    Comment: occasionally   Drug use: No   Sexual activity: Not on file  Other Topics Concern   Not on file  Social History Narrative   Not on file   Social Determinants of Health   Financial Resource Strain: Not on file  Food Insecurity: Not on file  Transportation Needs: Not on file  Physical Activity: Not on file  Stress: Not on file  Social Connections: Not on file  Intimate Partner Violence: Not on file      Review of Systems  Constitutional:  Negative for chills, fatigue and unexpected weight change.  HENT:  Negative for congestion, postnasal drip, rhinorrhea, sneezing and sore throat.   Eyes:  Negative for redness.  Respiratory: Negative.  Negative for cough, chest tightness, shortness of breath and wheezing.   Cardiovascular:  Positive for palpitations. Negative for chest pain.  Gastrointestinal:  Negative for abdominal pain, constipation, diarrhea, nausea and vomiting.  Genitourinary:  Negative for dysuria and frequency.  Musculoskeletal:  Negative for arthralgias, back pain, joint swelling and  neck pain.  Skin:  Negative for rash.  Neurological: Negative.   Hematological:  Negative for adenopathy. Does not bruise/bleed easily.  Psychiatric/Behavioral:  Negative for behavioral problems (Depression), sleep disturbance and suicidal ideas. The patient is not nervous/anxious.     Vital Signs: BP 114/74   Pulse 82   Temp 97.6 F (36.4 C)   Resp 16   Ht '5\' 2"'$  (1.575 m)   Wt 177 lb 6.4 oz (80.5 kg)   SpO2 98%   BMI 32.45 kg/m    Physical Exam Vitals reviewed.  Constitutional:      General: She is not in acute distress.    Appearance: Normal appearance. She is obese.  She is not ill-appearing.  HENT:     Head: Normocephalic and atraumatic.  Eyes:     Pupils: Pupils are equal, round, and reactive to light.  Cardiovascular:     Rate and Rhythm: Normal rate and regular rhythm.  Pulmonary:     Effort: Pulmonary effort is normal. No respiratory distress.  Neurological:     Mental Status: She is alert and oriented to person, place, and time.  Psychiatric:        Mood and Affect: Mood normal.        Behavior: Behavior normal.        Assessment/Plan: 1. Palpitations 14 day long term external monitor ordered.  - LONG TERM MONITOR (3-14 DAYS); Future  2. Essential (primary) hypertension Stable, continue medication as prescribed.   3. Class 1 obesity due to excess calories without serious comorbidity with body mass index (BMI) of 32.0 to 32.9 in adult Medication changed to wegovy 1.7 mg weekly, follow up in 1 month for weight loss management - Semaglutide-Weight Management (WEGOVY) 1.7 MG/0.75ML SOAJ; Inject 1.7 mg into the skin once a week.  Dispense: 3 mL; Refill: 2   General Counseling: yaretzi ernandez understanding of the findings of todays visit and agrees with plan of treatment. I have discussed any further diagnostic evaluation that may be needed or ordered today. We also reviewed her medications today. she has been encouraged to call the office with any  questions or concerns that should arise related to todays visit.    Orders Placed This Encounter  Procedures   LONG TERM MONITOR (3-14 DAYS)    Meds ordered this encounter  Medications   Semaglutide-Weight Management (WEGOVY) 1.7 MG/0.75ML SOAJ    Sig: Inject 1.7 mg into the skin once a week.    Dispense:  3 mL    Refill:  2    Switch patient from ozempic to wegovy 1.7 mg dose. Dx code E66.01    Return in about 1 month (around 09/23/2022) for F/U, Weight loss, Wyeth Hoffer PCP.   Total time spent:30 Minutes Time spent includes review of chart, medications, test results, and follow up plan with the patient.   [place] Controlled Substance Database was reviewed by me.  This patient was seen by Jonetta Osgood, FNP-C in collaboration with Dr. Clayborn Bigness as a part of collaborative care agreement.   Constantin Hillery R. Valetta Fuller, MSN, FNP-C Internal medicine

## 2022-08-30 ENCOUNTER — Other Ambulatory Visit: Payer: Self-pay | Admitting: Nurse Practitioner

## 2022-08-30 DIAGNOSIS — R112 Nausea with vomiting, unspecified: Secondary | ICD-10-CM

## 2022-08-31 ENCOUNTER — Encounter: Payer: Self-pay | Admitting: Nurse Practitioner

## 2022-09-20 ENCOUNTER — Ambulatory Visit (INDEPENDENT_AMBULATORY_CARE_PROVIDER_SITE_OTHER): Payer: BC Managed Care – PPO | Admitting: Nurse Practitioner

## 2022-09-20 ENCOUNTER — Encounter: Payer: Self-pay | Admitting: Nurse Practitioner

## 2022-09-20 VITALS — BP 127/69 | HR 89 | Temp 98.2°F | Resp 16 | Ht 62.0 in | Wt 181.0 lb

## 2022-09-20 DIAGNOSIS — I479 Paroxysmal tachycardia, unspecified: Secondary | ICD-10-CM | POA: Diagnosis not present

## 2022-09-20 DIAGNOSIS — Z87898 Personal history of other specified conditions: Secondary | ICD-10-CM

## 2022-09-20 DIAGNOSIS — R002 Palpitations: Secondary | ICD-10-CM | POA: Diagnosis not present

## 2022-09-20 MED ORDER — SCOPOLAMINE 1 MG/3DAYS TD PT72: 1.0000 | MEDICATED_PATCH | TRANSDERMAL | 0 refills | Status: DC

## 2022-09-20 NOTE — Progress Notes (Signed)
[facility] [place], [place] [address]  Internal MEDICINE  Office Visit Note  Patient Name: Yvette Humphrey  [id number]  [id number]  Date of Service: 09/20/2022  Chief Complaint  Patient presents with   Follow-up    Weight loss and review heart monitoring     HPI Yvette Humphrey presents for a follow-up visit  for weight loss, motion sickness and to review heart monitor results Palpitations and tachycardia --- reviewed 2 week remote monitor report -- some runs of SVT  Insurance no longer covers weight loss medications.  Going on cruise, needs scopalamine for motion sickness      Current Medication: Outpatient Encounter Medications as of 09/20/2022  Medication Sig   ALPRAZolam (XANAX) 0.5 MG tablet TAKE 1 TABLET(0.5 MG) BY MOUTH DAILY AS NEEDED FOR ANXIETY   azelastine (ASTELIN) 0.1 % nasal spray Use 2 sq in each nostril every 6- 8 hrs qd   buPROPion (WELLBUTRIN) 75 MG tablet Take 1 tablet (75 mg total) by mouth 2 (two) times daily.   cetirizine (ZYRTEC) 10 MG tablet Take 10 mg by mouth daily.   cyanocobalamin 500 MCG tablet Take 500 mcg by mouth daily.   diclofenac (VOLTAREN) 75 MG EC tablet TAKE 1 TABLET(75 MG) BY MOUTH TWICE DAILY   diphenoxylate-atropine (LOMOTIL) 2.5-0.025 MG tablet Take 1 tablet by mouth 4 (four) times daily as needed for diarrhea or loose stools.   fluconazole (DIFLUCAN) 150 MG tablet Take 1 tablet (150 mg total) by mouth once a week. For yeast infection prophylaxis while taking antibiotic.   fluticasone (FLONASE) 50 MCG/ACT nasal spray Place 2 sprays into both nostrils daily.   fluticasone (FLOVENT HFA) 110 MCG/ACT inhaler Inhale 2 puffs into the lungs in the morning and at bedtime.   gabapentin (NEURONTIN) 100 MG capsule TAKE 2 CAPSULES BY MOUTH IN AM, TAKE 2 CAPSULES BY MOUTH IN AFTERNOON, TAKE 4 CAPSULES BY MOUTH IN PM   gabapentin (NEURONTIN) 300 MG capsule TAKE 1 CAPSULE BY MOUTH EVERY MORNING 1 EVERY EVENING AND 2 CAPSULES AT BEDTIME    Lysine 1000 MG TABS Take by mouth daily as needed.   metoprolol succinate (TOPROL-XL) 50 MG 24 hr tablet TAKE 1 TABLET BY MOUTH EVERY NIGHT AT BEDTIME FOR BLOOD PRESSURE   montelukast (SINGULAIR) 10 MG tablet TAKE 1 TABLET(10 MG) BY MOUTH AT BEDTIME   omeprazole (PRILOSEC) 20 MG capsule Take 1 capsule (20 mg total) by mouth daily.   ondansetron (ZOFRAN) 4 MG tablet TAKE 1 TABLET(4 MG) BY MOUTH EVERY 8 HOURS AS NEEDED FOR NAUSEA OR VOMITING   rosuvastatin (CRESTOR) 5 MG tablet TAKE 1 TABLET(5 MG) BY MOUTH DAILY   scopolamine (TRANSDERM-SCOP) 1 MG/3DAYS Place 1 patch (1.5 mg total) onto the skin every 3 (three) days.   Semaglutide, 1 MG/DOSE, 4 MG/3ML SOPN Inject 1 mg as directed once a week.   Semaglutide-Weight Management (WEGOVY) 1.7 MG/0.75ML SOAJ Inject 1.7 mg into the skin once a week.   sertraline (ZOLOFT) 100 MG tablet TAKE 1 TABLET(100 MG) BY MOUTH DAILY   traMADol (ULTRAM) 50 MG tablet Take 1 tablet (50 mg total) by mouth every 6 (six) hours as needed for severe pain.   traZODone (DESYREL) 100 MG tablet TAKE 1 TO 2 TABLETS(100 TO 200 MG) BY MOUTH AT BEDTIME   triamcinolone cream (KENALOG) 0.1 % Apply topically twice daily as needed for itching and rash   Ubrogepant (UBRELVY) 100 MG TABS Take 100 mg by mouth daily as needed (acute migraine).   No  facility-administered encounter medications on file as of 09/20/2022.    Surgical History: Past Surgical History:  Procedure Laterality Date   CHOLECYSTECTOMY     ENDOMETRIAL ABLATION     approx 2009   ESOPHAGOGASTRODUODENOSCOPY     REDUCTION MAMMAPLASTY Bilateral 2004   TONSILLECTOMY     TUBAL LIGATION     ULNAR NERVE TRANSPOSITION Left 06/08/2016   Procedure: SUBCUTANEOUS ANTERIOR ULNAR NERVE TRANSPOSITION LEFT ELBOW;  Surgeon: Corky Mull, MD;  Location: [place];  Service: Orthopedics;  Laterality: Left;    Medical History: Past Medical History:  Diagnosis Date   Complication of anesthesia    Headache     migraines, 1x/mo   Hyperlipidemia    Hypertension    Motion sickness    back seat cars   PONV (postoperative nausea and vomiting)    Tachycardia    Wears contact lenses     Family History: Family History  Problem Relation Age of Onset   Hypertension Mother    Hyperlipidemia Mother    Hyperlipidemia Maternal Grandfather    COPD Maternal Grandfather    Breast cancer Paternal Grandmother     Social History   Socioeconomic History   Marital status: Married    Spouse name: Not on file   Number of children: Not on file   Years of education: Not on file   Highest education level: Not on file  Occupational History   Not on file  Tobacco Use   Smoking status: Never   Smokeless tobacco: Never  Vaping Use   Vaping Use: Never used  Substance and Sexual Activity   Alcohol use: Yes    Alcohol/week: 1.0 standard drink of alcohol    Types: 1 Glasses of wine per week    Comment: occasionally   Drug use: No   Sexual activity: Not on file  Other Topics Concern   Not on file  Social History Narrative   Not on file   Social Determinants of Health   Financial Resource Strain: Not on file  Food Insecurity: Not on file  Transportation Needs: Not on file  Physical Activity: Not on file  Stress: Not on file  Social Connections: Not on file  Intimate Partner Violence: Not on file      Review of Systems  Constitutional:  Positive for unexpected weight change. Negative for chills and fatigue.  HENT:  Negative for congestion, postnasal drip, rhinorrhea, sneezing and sore throat.   Respiratory: Negative.  Negative for cough, chest tightness, shortness of breath and wheezing.   Cardiovascular:  Positive for palpitations. Negative for chest pain.  Gastrointestinal:  Negative for abdominal pain, constipation, diarrhea, nausea and vomiting.  Genitourinary:  Negative for dysuria and frequency.  Musculoskeletal:  Negative for arthralgias, back pain, joint swelling and neck pain.  Skin:   Negative for rash.  Neurological: Negative.   Hematological:  Negative for adenopathy. Does not bruise/bleed easily.  Psychiatric/Behavioral:  Negative for behavioral problems (Depression), sleep disturbance and suicidal ideas. The patient is not nervous/anxious.     Vital Signs: BP 127/69   Pulse 89   Temp 98.2 F (36.8 C)   Resp 16   Ht '5\' 2"'$  (1.575 m)   Wt 181 lb (82.1 kg)   SpO2 97%   BMI 33.11 kg/m    Physical Exam Vitals reviewed.  Constitutional:      General: She is not in acute distress.    Appearance: Normal appearance. She is obese. She is not ill-appearing.  HENT:  Head: Normocephalic and atraumatic.  Eyes:     Pupils: Pupils are equal, round, and reactive to light.  Cardiovascular:     Rate and Rhythm: Normal rate and regular rhythm.  Pulmonary:     Effort: Pulmonary effort is normal. No respiratory distress.  Neurological:     Mental Status: She is alert and oriented to person, place, and time.  Psychiatric:        Mood and Affect: Mood normal.        Behavior: Behavior normal.        Assessment/Plan: 1. Palpitations Referred to cardiology - Ambulatory referral to Cardiology  2. Paroxysmal tachycardia ([place]) Referred to cardiology - Ambulatory referral to Cardiology  3. History of motion sickness Scopolamine prescribed, use as instructed during upcoming cruise.  - scopolamine (TRANSDERM-SCOP) 1 MG/3DAYS; Place 1 patch (1.5 mg total) onto the skin every 3 (three) days.  Dispense: 4 patch; Refill: 0   General Counseling: emmari pruski understanding of the findings of todays visit and agrees with plan of treatment. I have discussed any further diagnostic evaluation that may be needed or ordered today. We also reviewed her medications today. she has been encouraged to call the office with any questions or concerns that should arise related to todays visit.    Orders Placed This Encounter  Procedures   Ambulatory referral to Cardiology     Meds ordered this encounter  Medications   scopolamine (TRANSDERM-SCOP) 1 MG/3DAYS    Sig: Place 1 patch (1.5 mg total) onto the skin every 3 (three) days.    Dispense:  4 patch    Refill:  0    Return in about 3 months (around 12/19/2022) for F/U, Kenisha Lynds PCP.   Total time spent:30 Minutes Time spent includes review of chart, medications, test results, and follow up plan with the patient.   [place] Controlled Substance Database was reviewed by me.  This patient was seen by Jonetta Osgood, FNP-C in collaboration with Dr. Clayborn Bigness as a part of collaborative care agreement.   Yvette Sabado R. Valetta Fuller, MSN, FNP-C Internal medicine

## 2022-09-28 ENCOUNTER — Other Ambulatory Visit: Payer: Self-pay | Admitting: Nurse Practitioner

## 2022-09-28 DIAGNOSIS — Z760 Encounter for issue of repeat prescription: Secondary | ICD-10-CM

## 2022-09-28 NOTE — Telephone Encounter (Signed)
Ok to send

## 2022-10-01 ENCOUNTER — Encounter: Payer: Self-pay | Admitting: Nurse Practitioner

## 2022-10-17 ENCOUNTER — Other Ambulatory Visit: Payer: Self-pay | Admitting: Internal Medicine

## 2022-10-17 ENCOUNTER — Other Ambulatory Visit: Payer: Self-pay | Admitting: Nurse Practitioner

## 2022-10-17 DIAGNOSIS — B3731 Acute candidiasis of vulva and vagina: Secondary | ICD-10-CM

## 2022-10-24 ENCOUNTER — Other Ambulatory Visit: Payer: Self-pay

## 2022-10-24 ENCOUNTER — Emergency Department
Admission: EM | Admit: 2022-10-24 | Discharge: 2022-10-24 | Disposition: A | Discharge status: Home / Self Care | Payer: BC Managed Care – PPO | Attending: Emergency Medicine | Admitting: Emergency Medicine

## 2022-10-24 ENCOUNTER — Encounter: Payer: Self-pay | Admitting: Nurse Practitioner

## 2022-10-24 ENCOUNTER — Emergency Department: Payer: BC Managed Care – PPO

## 2022-10-24 DIAGNOSIS — R112 Nausea with vomiting, unspecified: Secondary | ICD-10-CM | POA: Diagnosis present

## 2022-10-24 DIAGNOSIS — I10 Essential (primary) hypertension: Secondary | ICD-10-CM | POA: Diagnosis not present

## 2022-10-24 DIAGNOSIS — K529 Noninfective gastroenteritis and colitis, unspecified: Secondary | ICD-10-CM | POA: Diagnosis not present

## 2022-10-24 LAB — CBC
HCT: 37 % (ref 36.0–46.0)
Hemoglobin: 12.3 g/dL (ref 12.0–15.0)
MCH: 31.1 pg (ref 26.0–34.0)
MCHC: 33.2 g/dL (ref 30.0–36.0)
MCV: 93.4 fL (ref 80.0–100.0)
Platelets: 226 10*3/uL (ref 150–400)
RBC: 3.96 MIL/uL (ref 3.87–5.11)
RDW: 11.9 % (ref 11.5–15.5)
WBC: 5.8 10*3/uL (ref 4.0–10.5)
nRBC: 0 % (ref 0.0–0.2)

## 2022-10-24 LAB — COMPREHENSIVE METABOLIC PANEL
ALT: 36 U/L (ref 0–44)
AST: 31 U/L (ref 15–41)
Albumin: 4.1 g/dL (ref 3.5–5.0)
Alkaline Phosphatase: 44 U/L (ref 38–126)
Anion gap: 8 (ref 5–15)
BUN: 13 mg/dL (ref 6–20)
CO2: 24 mmol/L (ref 22–32)
Calcium: 8.8 mg/dL — ABNORMAL LOW (ref 8.9–10.3)
Chloride: 107 mmol/L (ref 98–111)
Creatinine, Ser: 0.66 mg/dL (ref 0.44–1.00)
GFR, Estimated: >60 mL/min (ref >60)
Glucose, Bld: 105 mg/dL — ABNORMAL HIGH (ref 70–99)
Potassium: 3.8 mmol/L (ref 3.5–5.1)
Sodium: 139 mmol/L (ref 135–145)
Total Bilirubin: 0.4 mg/dL (ref 0.3–1.2)
Total Protein: 7.1 g/dL (ref 6.5–8.1)

## 2022-10-24 LAB — LIPASE, BLOOD: Lipase: 39 U/L (ref 11–51)

## 2022-10-24 MED ORDER — ONDANSETRON HCL 4 MG/2ML IJ SOLN
4.0000 mg | Freq: Once | INTRAMUSCULAR | Status: AC
  Administered 2022-10-24: 4 mg via INTRAVENOUS
  Filled 2022-10-24: qty 2

## 2022-10-24 MED ORDER — KETOROLAC TROMETHAMINE 30 MG/ML IJ SOLN
30.0000 mg | Freq: Once | INTRAMUSCULAR | Status: AC
  Administered 2022-10-24: 30 mg via INTRAVENOUS
  Filled 2022-10-24: qty 1

## 2022-10-24 MED ORDER — SODIUM CHLORIDE 0.9 % IV SOLN: Freq: Once | INTRAVENOUS | Status: AC

## 2022-10-24 NOTE — ED Provider Notes (Signed)
[facility] Provider Note    Event Date/Time   First MD Initiated Contact with Patient 10/24/22 1256     (approximate)   History   Diarrhea   HPI  Yvette Humphrey is a 47 y.o. female with history of hypertension presents with complaints of nausea vomiting diarrhea started over the last 1 to 2 days.  She has had body aches as well is likely fevers.  Abdominal cramping intermittently more so on the right side now     Physical Exam   Triage Vital Signs: ED Triage Vitals [10/24/22 1238]  Enc Vitals Group     BP 119/68     Pulse Rate 81     Resp 16     Temp 98.6 F (37 C)     Temp src      SpO2 95 %     Weight 79.8 kg (176 lb)     Height 1.6 m (5\' 3" )     Head Circumference      Peak Flow      Pain Score 7     Pain Loc      Pain Edu?      Excl. in [place]?     Most recent vital signs: Vitals:   10/24/22 1238  BP: 119/68  Pulse: 81  Resp: 16  Temp: 98.6 F (37 C)  SpO2: 95%     General: Awake, no distress.  CV:  Good peripheral perfusion.  Resp:  Normal effort.  Abd:  No distention.  Mild tenderness on the right upper and lower abdomen Other:     ED Results / Procedures / Treatments   Labs (all labs ordered are listed, but only abnormal results are displayed) Labs Reviewed  COMPREHENSIVE METABOLIC PANEL - Abnormal; Notable for the following components:      Result Value   Glucose, Bld 105 (*)    Calcium 8.8 (*)    All other components within normal limits  LIPASE, BLOOD  CBC  URINALYSIS, ROUTINE W REFLEX MICROSCOPIC     EKG     RADIOLOGY CT renal stone study viewed interpret by me, no acute abnormality, pending radiology read    PROCEDURES:  Critical Care performed:   Procedures   MEDICATIONS ORDERED IN ED: Medications  0.9 %  sodium chloride infusion (0 mLs Intravenous Stopped 10/24/22 1517)  ondansetron (ZOFRAN) injection 4 mg (4 mg Intravenous Given 10/24/22 1337)  ketorolac (TORADOL) 30 MG/ML injection 30  mg (30 mg Intravenous Given 10/24/22 1343)     IMPRESSION / MDM / ASSESSMENT AND PLAN / ED COURSE  I reviewed the triage vital signs and the nursing notes. Patient's presentation is most consistent with acute illness / injury with system symptoms.  Patient presents with nausea vomiting diarrhea as detailed above, suspicious for viral gastroenteritis which is prevalent in the community at this time.  However concerning that she has more pain on the right side and.  Will obtain CT renal stone study to evaluate for appendicitis, diverticulitis, colitis.  Will treat with IV fluids, IV Zofran, IV Toradol and reevaluate  Work reviewed and is quite reassuring, CT scan is unremarkable.  Patient is feeling better after IV fluids, appropriate for discharge with outpatient follow-up, return precautions discussed, she has Zofran at home        FINAL CLINICAL IMPRESSION(S) / ED DIAGNOSES   Final diagnoses:  Gastroenteritis     Rx / DC Orders   ED Discharge Orders  None        Note:  This document was prepared using Dragon voice recognition software and may include unintentional dictation errors.   Lavonia Drafts, MD 10/24/22 [phone]

## 2022-10-24 NOTE — ED Triage Notes (Signed)
Pt to ED for emesis and diarrhea that started Friday. Reports stool darkened last night. Fevers 101 max.  Reports right sided flank pain

## 2022-10-27 ENCOUNTER — Encounter: Payer: Self-pay | Admitting: Cardiovascular Disease

## 2022-10-27 ENCOUNTER — Ambulatory Visit: Payer: BC Managed Care – PPO | Attending: Cardiovascular Disease | Admitting: Cardiovascular Disease

## 2022-10-27 VITALS — BP 118/72 | HR 66 | Ht 63.0 in | Wt 184.2 lb

## 2022-10-27 DIAGNOSIS — E785 Hyperlipidemia, unspecified: Secondary | ICD-10-CM | POA: Diagnosis not present

## 2022-10-27 DIAGNOSIS — R002 Palpitations: Secondary | ICD-10-CM

## 2022-10-27 DIAGNOSIS — I471 Supraventricular tachycardia, unspecified: Secondary | ICD-10-CM | POA: Diagnosis not present

## 2022-10-27 NOTE — Patient Instructions (Signed)
Medication Instructions:  No changes *If you need a refill on your cardiac medications before your next appointment, please call your pharmacy*   Lab Work: None ordered If you have labs (blood work) drawn today and your tests are completely normal, you will receive your results only by: MyChart Message (if you have MyChart) OR A paper copy in the mail If you have any lab test that is abnormal or we need to change your treatment, we will call you to review the results.   Testing/Procedures: None ordered   Follow-Up: At [facility], you and your health needs are our priority.  As part of our continuing mission to provide you with exceptional heart care, we have created designated Provider Care Teams.  These Care Teams include your primary Cardiologist (physician) and Advanced Practice Providers (APPs -  Physician Assistants and Nurse Practitioners) who all work together to provide you with the care you need, when you need it.  We recommend signing up for the patient portal called "MyChart".  Sign up information is provided on this After Visit Summary.  MyChart is used to connect with patients for Virtual Visits (Telemedicine).  Patients are able to view lab/test results, encounter notes, upcoming appointments, etc.  Non-urgent messages can be sent to your provider as well.   To learn more about what you can do with MyChart, go to [email or web address].    Your next appointment:   12 month(s)  Provider:   You may see Dr. Arida or one of the following Advanced Practice Providers on your designated Care Team:   Christopher Berge, NP Ryan Dunn, PA-C Cadence Furth, PA-C Sheri Hammock, NP    

## 2022-10-27 NOTE — Progress Notes (Signed)
Cardiology Office Note   Date:  10/27/2022   ID:  Yvette Humphrey, DOB [birth year], MRN [id number]  PCP:  Jonetta Osgood, NP  Cardiologist:   Kathlyn Sacramento, MD   Chief Complaint  Patient presents with   Other    Palpitations/SVT. Meds reviewed verbally with pt.      History of Present Illness: Yvette Humphrey is a 47 y.o. female who was referred by Jonetta Osgood for evaluation of palpitations. She reports prolonged history of palpitations and has been on metoprolol for at least 10 years.  She had some recent worsening with increased stress. She had a 2 weeks ZIO monitor done in January which showed sinus rhythm with 6 short runs of supraventricular tachycardia the longest lasted 14 beats. She never had syncope in the past and denies chest pain or shortness of breath.  No prolonged episode of SVT and the episodes have been generally self-limiting. She is a lifelong non-smoker and has no family history of premature coronary artery disease or sudden death.  Her mother has history of palpitations and tachycardia. She works at [employer] at [place].  Past Medical History:  Diagnosis Date   Complication of anesthesia    Headache    migraines, 1x/mo   Hyperlipidemia    Hypertension    Motion sickness    back seat cars   PONV (postoperative nausea and vomiting)    Tachycardia    Wears contact lenses     Past Surgical History:  Procedure Laterality Date   CHOLECYSTECTOMY     ENDOMETRIAL ABLATION     approx 2009   ESOPHAGOGASTRODUODENOSCOPY     REDUCTION MAMMAPLASTY Bilateral 2004   TONSILLECTOMY     TUBAL LIGATION     ULNAR NERVE TRANSPOSITION Left 06/08/2016   Procedure: SUBCUTANEOUS ANTERIOR ULNAR NERVE TRANSPOSITION LEFT ELBOW;  Surgeon: Corky Mull, MD;  Location: [place];  Service: Orthopedics;  Laterality: Left;     Current Outpatient Medications  Medication Sig Dispense Refill   ALPRAZolam (XANAX) 0.5 MG tablet TAKE 1  TABLET(0.5 MG) BY MOUTH DAILY AS NEEDED FOR ANXIETY 30 tablet 2   azelastine (ASTELIN) 0.1 % nasal spray Use 2 sq in each nostril every 6- 8 hrs qd 30 mL 12   buPROPion (WELLBUTRIN) 75 MG tablet TAKE 1 TABLET(75 MG) BY MOUTH TWICE DAILY 180 tablet 1   cetirizine (ZYRTEC) 10 MG tablet Take 10 mg by mouth daily.     cyanocobalamin 500 MCG tablet Take 500 mcg by mouth daily.     diclofenac (VOLTAREN) 75 MG EC tablet TAKE 1 TABLET(75 MG) BY MOUTH TWICE DAILY 60 tablet 2   diphenoxylate-atropine (LOMOTIL) 2.5-0.025 MG tablet Take 1 tablet by mouth 4 (four) times daily as needed for diarrhea or loose stools. 30 tablet 0   fluticasone (FLOVENT HFA) 110 MCG/ACT inhaler Inhale 2 puffs into the lungs in the morning and at bedtime. 1 each 12   gabapentin (NEURONTIN) 300 MG capsule TAKE 1 CAPSULE BY MOUTH EVERY MORNING 1 EVERY EVENING AND 2 CAPSULES AT BEDTIME 120 capsule 2   Lysine 1000 MG TABS Take by mouth daily as needed.     metoprolol succinate (TOPROL-XL) 50 MG 24 hr tablet TAKE 1 TABLET BY MOUTH EVERY NIGHT AT BEDTIME FOR BLOOD PRESSURE 90 tablet 1   montelukast (SINGULAIR) 10 MG tablet TAKE 1 TABLET(10 MG) BY MOUTH AT BEDTIME 90 tablet 3   omeprazole (PRILOSEC) 20 MG capsule TAKE 1 CAPSULE(20 MG) BY  MOUTH DAILY 30 capsule 3   ondansetron (ZOFRAN) 4 MG tablet TAKE 1 TABLET(4 MG) BY MOUTH EVERY 8 HOURS AS NEEDED FOR NAUSEA OR VOMITING 18 tablet 0   rosuvastatin (CRESTOR) 5 MG tablet TAKE 1 TABLET(5 MG) BY MOUTH DAILY 90 tablet 3   scopolamine (TRANSDERM-SCOP) 1 MG/3DAYS Place 1 patch (1.5 mg total) onto the skin every 3 (three) days. 4 patch 0   sertraline (ZOLOFT) 100 MG tablet TAKE 1 TABLET(100 MG) BY MOUTH DAILY 90 tablet 1   traMADol (ULTRAM) 50 MG tablet Take 1 tablet (50 mg total) by mouth every 6 (six) hours as needed for severe pain. 60 tablet 1   traZODone (DESYREL) 100 MG tablet TAKE 1 TO 2 TABLETS(100 TO 200 MG) BY MOUTH AT BEDTIME 180 tablet 1   triamcinolone cream (KENALOG) 0.1 % Apply  topically twice daily as needed for itching and rash 45 g 1   Ubrogepant (UBRELVY) 100 MG TABS Take 100 mg by mouth daily as needed (acute migraine). 16 tablet 3   Semaglutide, 1 MG/DOSE, 4 MG/3ML SOPN Inject 1 mg as directed once a week. (Patient not taking: Reported on 10/27/2022) 3 mL 2   Semaglutide-Weight Management (WEGOVY) 1.7 MG/0.75ML SOAJ Inject 1.7 mg into the skin once a week. (Patient not taking: Reported on 10/27/2022) 3 mL 2   No current facility-administered medications for this visit.    Allergies:   Oxycodone    Social History:  The patient  reports that she has never smoked. She has never used smokeless tobacco. She reports current alcohol use of about 1.0 standard drink of alcohol per week. She reports that she does not use drugs.   Family History:  The patient's family history includes Arrhythmia in her mother; Breast cancer in her paternal grandmother; COPD in her maternal grandfather; Heart attack in her maternal grandfather; Hyperlipidemia in her maternal grandfather and mother; Hypertension in her mother.    ROS:  Please see the history of present illness.   Otherwise, review of systems are positive for none.   All other systems are reviewed and negative.    PHYSICAL EXAM: VS:  BP 118/72 (BP Location: Right Arm, Patient Position: Sitting, Cuff Size: Normal)   Pulse 66   Ht 5\' 3"  (1.6 m)   Wt 184 lb 4 oz (83.6 kg)   SpO2 98%   BMI 32.64 kg/m  , BMI Body mass index is 32.64 kg/m. GEN: Well nourished, well developed, in no acute distress  HEENT: normal  Neck: no JVD, carotid bruits, or masses Cardiac: RRR; no murmurs, rubs, or gallops,no edema  Respiratory:  clear to auscultation bilaterally, normal work of breathing GI: soft, nontender, nondistended, + BS MS: no deformity or atrophy  Skin: warm and dry, no rash Neuro:  Strength and sensation are intact Psych: euthymic mood, full affect   EKG:  EKG is ordered today. The ekg ordered today demonstrates  normal sinus rhythm with no significant ST or T wave changes.   Recent Labs: 10/24/2022: ALT 36; BUN 13; Creatinine, Ser 0.66; Hemoglobin 12.3; Platelets 226; Potassium 3.8; Sodium 139    Lipid Panel    Component Value Date/Time   CHOL 169 06/15/2022 0906   TRIG 212 (H) 06/15/2022 0906   HDL 49 06/15/2022 0906   CHOLHDL 3.4 06/15/2022 0906   LDLCALC 85 06/15/2022 0906      Wt Readings from Last 3 Encounters:  10/27/22 184 lb 4 oz (83.6 kg)  10/24/22 176 lb (79.8 kg)  09/20/22 181  lb (82.1 kg)          10/27/2022    2:58 PM  PAD Screen  Previous PAD dx? No  Previous surgical procedure? No  Pain with walking? No  Feet/toe relief with dangling? No  Painful, non-healing ulcers? No  Extremities discolored? No      ASSESSMENT AND PLAN:  1.  Paroxysmal supraventricular tachycardia: Her episodes are usually short and self limiting.  She never had any prolonged episodes of tachycardia requiring emergency room visits or adenosine treatment.  Thus, I think it is reasonable to continue with Toprol 50 mg once daily for now.  If her episodes become more frequent and lasting longer, ablation can be considered. Her cardiac exam is unremarkable and baseline EKG is normal.  2.  Hyperlipidemia: Currently on small dose rosuvastatin with most recent LDL of 85.    Disposition:   FU with me in 1 year  Signed,  Kathlyn Sacramento, MD  10/27/2022 3:22 PM    [facility]

## 2022-11-08 ENCOUNTER — Other Ambulatory Visit: Payer: Self-pay | Admitting: Nurse Practitioner

## 2022-11-08 DIAGNOSIS — G8929 Other chronic pain: Secondary | ICD-10-CM

## 2022-11-08 DIAGNOSIS — Z760 Encounter for issue of repeat prescription: Secondary | ICD-10-CM

## 2022-11-08 NOTE — Telephone Encounter (Signed)
Next 5/24

## 2022-11-09 ENCOUNTER — Encounter: Payer: Self-pay | Admitting: Nurse Practitioner

## 2022-11-09 DIAGNOSIS — G43009 Migraine without aura, not intractable, without status migrainosus: Secondary | ICD-10-CM

## 2022-11-09 MED ORDER — UBRELVY 100 MG PO TABS: 100.0000 mg | ORAL_TABLET | Freq: Every day | ORAL | 3 refills | Status: DC | PRN

## 2022-12-05 ENCOUNTER — Other Ambulatory Visit: Payer: Self-pay | Admitting: Nurse Practitioner

## 2022-12-05 DIAGNOSIS — M542 Cervicalgia: Secondary | ICD-10-CM

## 2022-12-05 DIAGNOSIS — M064 Inflammatory polyarthropathy: Secondary | ICD-10-CM

## 2022-12-05 DIAGNOSIS — G8929 Other chronic pain: Secondary | ICD-10-CM

## 2022-12-16 ENCOUNTER — Other Ambulatory Visit: Payer: Self-pay | Admitting: Nurse Practitioner

## 2022-12-16 DIAGNOSIS — J301 Allergic rhinitis due to pollen: Secondary | ICD-10-CM

## 2022-12-19 ENCOUNTER — Ambulatory Visit (INDEPENDENT_AMBULATORY_CARE_PROVIDER_SITE_OTHER): Payer: BC Managed Care – PPO | Admitting: Nurse Practitioner

## 2022-12-19 ENCOUNTER — Encounter: Payer: Self-pay | Admitting: Nurse Practitioner

## 2022-12-19 VITALS — BP 120/84 | HR 76 | Temp 96.9°F | Resp 16 | Ht 63.0 in | Wt 190.6 lb

## 2022-12-19 DIAGNOSIS — G8929 Other chronic pain: Secondary | ICD-10-CM

## 2022-12-19 DIAGNOSIS — E6609 Other obesity due to excess calories: Secondary | ICD-10-CM

## 2022-12-19 DIAGNOSIS — Z79899 Other long term (current) drug therapy: Secondary | ICD-10-CM | POA: Diagnosis not present

## 2022-12-19 DIAGNOSIS — Z6832 Body mass index (BMI) 32.0-32.9, adult: Secondary | ICD-10-CM

## 2022-12-19 DIAGNOSIS — Z760 Encounter for issue of repeat prescription: Secondary | ICD-10-CM

## 2022-12-19 DIAGNOSIS — K580 Irritable bowel syndrome with diarrhea: Secondary | ICD-10-CM

## 2022-12-19 DIAGNOSIS — E782 Mixed hyperlipidemia: Secondary | ICD-10-CM

## 2022-12-19 DIAGNOSIS — R197 Diarrhea, unspecified: Secondary | ICD-10-CM

## 2022-12-19 MED ORDER — ALPRAZOLAM 0.5 MG PO TABS: ORAL_TABLET | ORAL | 2 refills | Status: DC

## 2022-12-19 MED ORDER — PHENTERMINE HCL 15 MG PO CAPS: 15.0000 mg | ORAL_CAPSULE | ORAL | 1 refills | Status: DC

## 2022-12-19 MED ORDER — ROSUVASTATIN CALCIUM 5 MG PO TABS: 5.0000 mg | ORAL_TABLET | Freq: Every day | ORAL | 3 refills | Status: DC

## 2022-12-19 MED ORDER — METOPROLOL SUCCINATE ER 50 MG PO TB24: ORAL_TABLET | ORAL | 1 refills | Status: DC

## 2022-12-19 MED ORDER — SERTRALINE HCL 100 MG PO TABS: ORAL_TABLET | ORAL | 1 refills | Status: DC

## 2022-12-19 MED ORDER — BUPROPION HCL 75 MG PO TABS: 75.0000 mg | ORAL_TABLET | Freq: Two times a day (BID) | ORAL | 1 refills | Status: DC

## 2022-12-19 MED ORDER — TRAMADOL HCL 50 MG PO TABS: 50.0000 mg | ORAL_TABLET | Freq: Four times a day (QID) | ORAL | 1 refills | Status: DC | PRN

## 2022-12-19 MED ORDER — RIFAXIMIN 550 MG PO TABS: 550.0000 mg | ORAL_TABLET | Freq: Three times a day (TID) | ORAL | 0 refills | Status: DC

## 2022-12-19 MED ORDER — DIPHENOXYLATE-ATROPINE 2.5-0.025 MG PO TABS: 1.0000 | ORAL_TABLET | Freq: Four times a day (QID) | ORAL | 0 refills | Status: DC | PRN

## 2022-12-19 MED ORDER — TOPIRAMATE 25 MG PO TABS: 25.0000 mg | ORAL_TABLET | Freq: Two times a day (BID) | ORAL | 3 refills | Status: DC

## 2022-12-19 NOTE — Progress Notes (Signed)
[facility] [address] [place], [place] [address]  Internal MEDICINE  Office Visit Note  Patient Name: Yvette Humphrey  [id number]  [id number]  Date of Service: 12/19/2022  Chief Complaint  Patient presents with   Hyperlipidemia   Hypertension   Follow-up    HPI Yvette Humphrey presents for a follow-up visit for  Weight gain -- has been walking, eating better,     Current Medication: Outpatient Encounter Medications as of 12/19/2022  Medication Sig   azelastine (ASTELIN) 0.1 % nasal spray Use 2 sq in each nostril every 6- 8 hrs qd   cetirizine (ZYRTEC) 10 MG tablet Take 10 mg by mouth daily.   cyanocobalamin 500 MCG tablet Take 500 mcg by mouth daily.   diclofenac (VOLTAREN) 75 MG EC tablet TAKE 1 TABLET(75 MG) BY MOUTH TWICE DAILY   fluticasone (FLOVENT HFA) 110 MCG/ACT inhaler Inhale 2 puffs into the lungs in the morning and at bedtime.   gabapentin (NEURONTIN) 300 MG capsule TAKE 1 CAPSULE BY MOUTH EVERY MORNING AND 1 EVERY EVENING AND 2 CAPSULES AT BEDTIME   Lysine 1000 MG TABS Take by mouth daily as needed.   montelukast (SINGULAIR) 10 MG tablet TAKE 1 TABLET(10 MG) BY MOUTH AT BEDTIME   omeprazole (PRILOSEC) 20 MG capsule TAKE 1 CAPSULE(20 MG) BY MOUTH DAILY   ondansetron (ZOFRAN) 4 MG tablet TAKE 1 TABLET(4 MG) BY MOUTH EVERY 8 HOURS AS NEEDED FOR NAUSEA OR VOMITING   phentermine 15 MG capsule Take 1 capsule (15 mg total) by mouth every morning.   rifaximin (XIFAXAN) 550 MG TABS tablet Take 1 tablet (550 mg total) by mouth 3 (three) times daily.   scopolamine (TRANSDERM-SCOP) 1 MG/3DAYS Place 1 patch (1.5 mg total) onto the skin every 3 (three) days.   topiramate (TOPAMAX) 25 MG tablet Take 1 tablet (25 mg total) by mouth 2 (two) times daily.   traZODone (DESYREL) 100 MG tablet TAKE 1 TO 2 TABLETS(100 TO 200 MG) BY MOUTH AT BEDTIME   triamcinolone cream (KENALOG) 0.1 % Apply topically twice daily as needed for itching and rash   Ubrogepant (UBRELVY) 100 MG TABS  Take 1 tablet (100 mg total) by mouth daily as needed (acute migraine).   [DISCONTINUED] ALPRAZolam (XANAX) 0.5 MG tablet TAKE 1 TABLET(0.5 MG) BY MOUTH DAILY AS NEEDED FOR ANXIETY   [DISCONTINUED] buPROPion (WELLBUTRIN) 75 MG tablet TAKE 1 TABLET(75 MG) BY MOUTH TWICE DAILY   [DISCONTINUED] diphenoxylate-atropine (LOMOTIL) 2.5-0.025 MG tablet Take 1 tablet by mouth 4 (four) times daily as needed for diarrhea or loose stools.   [DISCONTINUED] metoprolol succinate (TOPROL-XL) 50 MG 24 hr tablet TAKE 1 TABLET BY MOUTH EVERY NIGHT AT BEDTIME FOR BLOOD PRESSURE   [DISCONTINUED] rosuvastatin (CRESTOR) 5 MG tablet TAKE 1 TABLET(5 MG) BY MOUTH DAILY   [DISCONTINUED] Semaglutide, 1 MG/DOSE, 4 MG/3ML SOPN Inject 1 mg as directed once a week.   [DISCONTINUED] Semaglutide-Weight Management (WEGOVY) 1.7 MG/0.75ML SOAJ Inject 1.7 mg into the skin once a week.   [DISCONTINUED] sertraline (ZOLOFT) 100 MG tablet TAKE 1 TABLET(100 MG) BY MOUTH DAILY   [DISCONTINUED] traMADol (ULTRAM) 50 MG tablet Take 1 tablet (50 mg total) by mouth every 6 (six) hours as needed for severe pain.   ALPRAZolam (XANAX) 0.5 MG tablet TAKE 1 TABLET(0.5 MG) BY MOUTH DAILY AS NEEDED FOR ANXIETY   buPROPion (WELLBUTRIN) 75 MG tablet Take 1 tablet (75 mg total) by mouth 2 (two) times daily.   diphenoxylate-atropine (LOMOTIL) 2.5-0.025 MG tablet Take 1 tablet by  mouth 4 (four) times daily as needed for diarrhea or loose stools.   metoprolol succinate (TOPROL-XL) 50 MG 24 hr tablet TAKE 1 TABLET BY MOUTH EVERY NIGHT AT BEDTIME FOR BLOOD PRESSURE   rosuvastatin (CRESTOR) 5 MG tablet Take 1 tablet (5 mg total) by mouth daily.   sertraline (ZOLOFT) 100 MG tablet TAKE 1 TABLET(100 MG) BY MOUTH DAILY   traMADol (ULTRAM) 50 MG tablet Take 1 tablet (50 mg total) by mouth every 6 (six) hours as needed for severe pain.   No facility-administered encounter medications on file as of 12/19/2022.    Surgical History: Past Surgical History:  Procedure  Laterality Date   CHOLECYSTECTOMY     ENDOMETRIAL ABLATION     approx 2009   ESOPHAGOGASTRODUODENOSCOPY     REDUCTION MAMMAPLASTY Bilateral 2004   TONSILLECTOMY     TUBAL LIGATION     ULNAR NERVE TRANSPOSITION Left 06/08/2016   Procedure: SUBCUTANEOUS ANTERIOR ULNAR NERVE TRANSPOSITION LEFT ELBOW;  Surgeon: Yvette Flake, MD;  Location: [facility];  Service: Orthopedics;  Laterality: Left;    Medical History: Past Medical History:  Diagnosis Date   Complication of anesthesia    Headache    migraines, 1x/mo   Hyperlipidemia    Hypertension    Motion sickness    back seat cars   PONV (postoperative nausea and vomiting)    Tachycardia    Wears contact lenses     Family History: Family History  Problem Relation Age of Onset   Hypertension Mother    Hyperlipidemia Mother    Arrhythmia Mother    Heart attack Maternal Grandfather    Hyperlipidemia Maternal Grandfather    COPD Maternal Grandfather    Breast cancer Paternal Grandmother     Social History   Socioeconomic History   Marital status: Married    Spouse name: Not on file   Number of children: Not on file   Years of education: Not on file   Highest education level: Not on file  Occupational History   Not on file  Tobacco Use   Smoking status: Never   Smokeless tobacco: Never  Vaping Use   Vaping Use: Never used  Substance and Sexual Activity   Alcohol use: Yes    Alcohol/week: 1.0 standard drink of alcohol    Types: 1 Glasses of wine per week    Comment: occasionally   Drug use: No   Sexual activity: Not on file  Other Topics Concern   Not on file  Social History Narrative   Not on file   Social Determinants of Health   Financial Resource Strain: Not on file  Food Insecurity: Not on file  Transportation Needs: Not on file  Physical Activity: Not on file  Stress: Not on file  Social Connections: Not on file  Intimate Partner Violence: Not on file      Review of Systems  Vital  Signs: BP 120/84   Pulse 76   Temp (!) 96.9 F (36.1 C)   Resp 16   Ht 5\' 3"  (1.6 m)   Wt 190 lb 9.6 oz (86.5 kg)   SpO2 97%   BMI 33.76 kg/m    Physical Exam     Assessment/Plan:   General Counseling: Yvette Humphrey verbalizes understanding of the findings of todays visit and agrees with plan of treatment. I have discussed any further diagnostic evaluation that may be needed or ordered today. We also reviewed her medications today. she has been encouraged to call the office  with any questions or concerns that should arise related to todays visit.    No orders of the defined types were placed in this encounter.   Meds ordered this encounter  Medications   phentermine 15 MG capsule    Sig: Take 1 capsule (15 mg total) by mouth every morning.    Dispense:  30 capsule    Refill:  1    Run through good rx, patient will have coupon   topiramate (TOPAMAX) 25 MG tablet    Sig: Take 1 tablet (25 mg total) by mouth 2 (two) times daily.    Dispense:  60 tablet    Refill:  3    Fill new script today   ALPRAZolam (XANAX) 0.5 MG tablet    Sig: TAKE 1 TABLET(0.5 MG) BY MOUTH DAILY AS NEEDED FOR ANXIETY    Dispense:  30 tablet    Refill:  2   buPROPion (WELLBUTRIN) 75 MG tablet    Sig: Take 1 tablet (75 mg total) by mouth 2 (two) times daily.    Dispense:  180 tablet    Refill:  1   metoprolol succinate (TOPROL-XL) 50 MG 24 hr tablet    Sig: TAKE 1 TABLET BY MOUTH EVERY NIGHT AT BEDTIME FOR BLOOD PRESSURE    Dispense:  90 tablet    Refill:  1    For future refills   diphenoxylate-atropine (LOMOTIL) 2.5-0.025 MG tablet    Sig: Take 1 tablet by mouth 4 (four) times daily as needed for diarrhea or loose stools.    Dispense:  30 tablet    Refill:  0   rosuvastatin (CRESTOR) 5 MG tablet    Sig: Take 1 tablet (5 mg total) by mouth daily.    Dispense:  90 tablet    Refill:  3   sertraline (ZOLOFT) 100 MG tablet    Sig: TAKE 1 TABLET(100 MG) BY MOUTH DAILY    Dispense:  90 tablet     Refill:  1   traMADol (ULTRAM) 50 MG tablet    Sig: Take 1 tablet (50 mg total) by mouth every 6 (six) hours as needed for severe pain.    Dispense:  60 tablet    Refill:  1   rifaximin (XIFAXAN) 550 MG TABS tablet    Sig: Take 1 tablet (550 mg total) by mouth 3 (three) times daily.    Dispense:  42 tablet    Refill:  0    Dx code K58.0    Return in about 8 weeks (around 02/13/2023) for F/U, Weight loss, eval new med, Khloie Hamada PCP.   Total time spent:*** Minutes Time spent includes review of chart, medications, test results, and follow up plan with the patient.   [place] Controlled Substance Database was reviewed by me.  This patient was seen by Sallyanne Kuster, FNP-C in collaboration with Dr. Beverely Risen as a part of collaborative care agreement.   Macyn Remmert R. Tedd Sias, MSN, FNP-C Internal medicine

## 2022-12-21 ENCOUNTER — Encounter: Payer: Self-pay | Admitting: Nurse Practitioner

## 2022-12-21 ENCOUNTER — Telehealth: Payer: Self-pay

## 2022-12-21 NOTE — Telephone Encounter (Signed)
PA was done and approved for Ubrelvy.

## 2022-12-24 ENCOUNTER — Encounter: Payer: Self-pay | Admitting: Nurse Practitioner

## 2022-12-24 DIAGNOSIS — N951 Menopausal and female climacteric states: Secondary | ICD-10-CM

## 2022-12-28 LAB — ESTRADIOL: Estradiol: 12 pg/mL

## 2022-12-28 LAB — FSH/LH
FSH: 44.2 m[IU]/mL
LH: 30.3 m[IU]/mL

## 2023-01-25 ENCOUNTER — Encounter: Payer: Self-pay | Admitting: Nurse Practitioner

## 2023-02-13 ENCOUNTER — Ambulatory Visit: Payer: BC Managed Care – PPO | Admitting: Nurse Practitioner

## 2023-02-19 ENCOUNTER — Other Ambulatory Visit: Payer: Self-pay | Admitting: Nurse Practitioner

## 2023-02-19 DIAGNOSIS — Z760 Encounter for issue of repeat prescription: Secondary | ICD-10-CM

## 2023-02-20 ENCOUNTER — Encounter: Payer: Self-pay | Admitting: Nurse Practitioner

## 2023-02-20 ENCOUNTER — Ambulatory Visit: Payer: BC Managed Care – PPO | Admitting: Nurse Practitioner

## 2023-02-20 VITALS — BP 120/84 | HR 86 | Temp 98.5°F | Resp 16 | Ht 63.0 in | Wt 188.8 lb

## 2023-02-20 DIAGNOSIS — R635 Abnormal weight gain: Secondary | ICD-10-CM

## 2023-02-20 DIAGNOSIS — K580 Irritable bowel syndrome with diarrhea: Secondary | ICD-10-CM | POA: Diagnosis not present

## 2023-02-20 DIAGNOSIS — Z6832 Body mass index (BMI) 32.0-32.9, adult: Secondary | ICD-10-CM

## 2023-02-20 DIAGNOSIS — E6609 Other obesity due to excess calories: Secondary | ICD-10-CM

## 2023-02-20 MED ORDER — PHENTERMINE HCL 37.5 MG PO TABS: 37.5000 mg | ORAL_TABLET | Freq: Every day | ORAL | 1 refills | Status: DC

## 2023-02-20 NOTE — Progress Notes (Signed)
[facility] [address] [place], [place] [address]  Internal MEDICINE  Office Visit Note  Patient Name: Yvette Humphrey  [id number]  [id number]  Date of Service: 02/20/2023  Chief Complaint  Patient presents with   Follow-up    Weight loss and eval new med    HPI Yvette Humphrey presents for a follow-up visit for diarrhea, IBS, and weight loss.  IBS-D -- xifaxan did not help, caused abdominal pain and no improvement in diarrhea.  Persistent diarrhea -- has not been referred to GI yet.  Weight loss - fluctuated over past 2 months -- topiramate and phentermine not helping as much during the second month.     Current Medication: Outpatient Encounter Medications as of 02/20/2023  Medication Sig   ALPRAZolam (XANAX) 0.5 MG tablet TAKE 1 TABLET(0.5 MG) BY MOUTH DAILY AS NEEDED FOR ANXIETY   azelastine (ASTELIN) 0.1 % nasal spray Use 2 sq in each nostril every 6- 8 hrs qd   buPROPion (WELLBUTRIN) 75 MG tablet Take 1 tablet (75 mg total) by mouth 2 (two) times daily.   cetirizine (ZYRTEC) 10 MG tablet Take 10 mg by mouth daily.   cyanocobalamin 500 MCG tablet Take 500 mcg by mouth daily.   diclofenac (VOLTAREN) 75 MG EC tablet TAKE 1 TABLET(75 MG) BY MOUTH TWICE DAILY   diphenoxylate-atropine (LOMOTIL) 2.5-0.025 MG tablet Take 1 tablet by mouth 4 (four) times daily as needed for diarrhea or loose stools.   fluticasone (FLOVENT HFA) 110 MCG/ACT inhaler Inhale 2 puffs into the lungs in the morning and at bedtime.   gabapentin (NEURONTIN) 300 MG capsule TAKE 1 CAPSULE BY MOUTH EVERY MORNING AND 1 EVERY EVENING AND 2 CAPSULES AT BEDTIME   Lysine 1000 MG TABS Take by mouth daily as needed.   metoprolol succinate (TOPROL-XL) 50 MG 24 hr tablet TAKE 1 TABLET BY MOUTH EVERY NIGHT AT BEDTIME FOR BLOOD PRESSURE   montelukast (SINGULAIR) 10 MG tablet TAKE 1 TABLET(10 MG) BY MOUTH AT BEDTIME   omeprazole (PRILOSEC) 20 MG capsule TAKE 1 CAPSULE(20 MG) BY MOUTH DAILY   ondansetron (ZOFRAN) 4 MG  tablet TAKE 1 TABLET(4 MG) BY MOUTH EVERY 8 HOURS AS NEEDED FOR NAUSEA OR VOMITING   phentermine (ADIPEX-P) 37.5 MG tablet Take 1 tablet (37.5 mg total) by mouth daily before breakfast.   rifaximin (XIFAXAN) 550 MG TABS tablet Take 1 tablet (550 mg total) by mouth 3 (three) times daily.   rosuvastatin (CRESTOR) 5 MG tablet Take 1 tablet (5 mg total) by mouth daily.   scopolamine (TRANSDERM-SCOP) 1 MG/3DAYS Place 1 patch (1.5 mg total) onto the skin every 3 (three) days.   sertraline (ZOLOFT) 100 MG tablet TAKE 1 TABLET(100 MG) BY MOUTH DAILY   topiramate (TOPAMAX) 25 MG tablet Take 1 tablet (25 mg total) by mouth 2 (two) times daily.   traMADol (ULTRAM) 50 MG tablet Take 1 tablet (50 mg total) by mouth every 6 (six) hours as needed for severe pain.   traZODone (DESYREL) 100 MG tablet TAKE 1 TO 2 TABLETS(100 TO 200 MG) BY MOUTH AT BEDTIME   triamcinolone cream (KENALOG) 0.1 % Apply topically twice daily as needed for itching and rash   Ubrogepant (UBRELVY) 100 MG TABS Take 1 tablet (100 mg total) by mouth daily as needed (acute migraine).   [DISCONTINUED] phentermine 15 MG capsule Take 1 capsule (15 mg total) by mouth every morning.   No facility-administered encounter medications on file as of 02/20/2023.    Surgical History: Past Surgical History:  Procedure Laterality Date   CHOLECYSTECTOMY     ENDOMETRIAL ABLATION     approx 2009   ESOPHAGOGASTRODUODENOSCOPY     REDUCTION MAMMAPLASTY Bilateral 2004   TONSILLECTOMY     TUBAL LIGATION     ULNAR NERVE TRANSPOSITION Left 06/08/2016   Procedure: SUBCUTANEOUS ANTERIOR ULNAR NERVE TRANSPOSITION LEFT ELBOW;  Surgeon: Christena Flake, MD;  Location: [facility];  Service: Orthopedics;  Laterality: Left;    Medical History: Past Medical History:  Diagnosis Date   Complication of anesthesia    Headache    migraines, 1x/mo   Hyperlipidemia    Hypertension    Motion sickness    back seat cars   PONV (postoperative nausea and  vomiting)    Tachycardia    Wears contact lenses     Family History: Family History  Problem Relation Age of Onset   Hypertension Mother    Hyperlipidemia Mother    Arrhythmia Mother    Heart attack Maternal Grandfather    Hyperlipidemia Maternal Grandfather    COPD Maternal Grandfather    Breast cancer Paternal Grandmother     Social History   Socioeconomic History   Marital status: Married    Spouse name: Not on file   Number of children: Not on file   Years of education: Not on file   Highest education level: Not on file  Occupational History   Not on file  Tobacco Use   Smoking status: Never   Smokeless tobacco: Never  Vaping Use   Vaping status: Never Used  Substance and Sexual Activity   Alcohol use: Yes    Alcohol/week: 1.0 standard drink of alcohol    Types: 1 Glasses of wine per week    Comment: occasionally   Drug use: No   Sexual activity: Not on file  Other Topics Concern   Not on file  Social History Narrative   Not on file   Social Determinants of Health   Financial Resource Strain: Not on file  Food Insecurity: Not on file  Transportation Needs: Not on file  Physical Activity: Not on file  Stress: Not on file  Social Connections: Not on file  Intimate Partner Violence: Not on file      Review of Systems  Constitutional:  Positive for appetite change and unexpected weight change. Negative for chills and fatigue.  HENT:  Negative for congestion, postnasal drip, rhinorrhea, sneezing and sore throat.   Respiratory: Negative.  Negative for cough, chest tightness, shortness of breath and wheezing.   Cardiovascular: Negative.  Negative for chest pain and palpitations.  Gastrointestinal:  Positive for diarrhea. Negative for abdominal pain, constipation, nausea and vomiting.  Genitourinary:  Negative for dysuria and frequency.  Musculoskeletal:  Negative for arthralgias, back pain, joint swelling and neck pain.  Skin:  Negative for rash.   Neurological: Negative.   Hematological:  Negative for adenopathy. Does not bruise/bleed easily.  Psychiatric/Behavioral:  Negative for behavioral problems (Depression), sleep disturbance and suicidal ideas. The patient is not nervous/anxious.     Vital Signs: BP 120/84   Pulse 86   Temp 98.5 F (36.9 C)   Resp 16   Ht 5\' 3"  (1.6 m)   Wt 188 lb 12.8 oz (85.6 kg)   SpO2 96%   BMI 33.44 kg/m    Physical Exam Vitals reviewed.  Constitutional:      General: She is not in acute distress.    Appearance: Normal appearance. She is obese. She is not ill-appearing.  HENT:     Head: Normocephalic and atraumatic.  Eyes:     Pupils: Pupils are equal, round, and reactive to light.  Cardiovascular:     Rate and Rhythm: Normal rate and regular rhythm.  Pulmonary:     Effort: Pulmonary effort is normal. No respiratory distress.  Neurological:     Mental Status: She is alert and oriented to person, place, and time.  Psychiatric:        Mood and Affect: Mood normal.        Behavior: Behavior normal.        Assessment/Plan: 1. Irritable bowel syndrome with diarrhea Referred to GI - Ambulatory referral to Gastroenterology  2. Abnormal weight gain Continue topiramate as prescribed. Phentermine dose increased. Follow up in 8 weeks  - phentermine (ADIPEX-P) 37.5 MG tablet; Take 1 tablet (37.5 mg total) by mouth daily before breakfast.  Dispense: 30 tablet; Refill: 1  3. Class 1 obesity due to excess calories without serious comorbidity with body mass index (BMI) of 32.0 to 32.9 in adult Continue topiramate as prescribed. Phentermine dose increased. Follow up in 8 weeks.  - phentermine (ADIPEX-P) 37.5 MG tablet; Take 1 tablet (37.5 mg total) by mouth daily before breakfast.  Dispense: 30 tablet; Refill: 1   General Counseling: olene godfrey understanding of the findings of todays visit and agrees with plan of treatment. I have discussed any further diagnostic evaluation that may  be needed or ordered today. We also reviewed her medications today. she has been encouraged to call the office with any questions or concerns that should arise related to todays visit.    Orders Placed This Encounter  Procedures   Ambulatory referral to Gastroenterology    Meds ordered this encounter  Medications   phentermine (ADIPEX-P) 37.5 MG tablet    Sig: Take 1 tablet (37.5 mg total) by mouth daily before breakfast.    Dispense:  30 tablet    Refill:  1    May start with 1/2 tablet daily.    Return in about 8 weeks (around 04/17/2023) for F/U, Weight loss, Adrina Armijo PCP.   Total time spent:30 Minutes Time spent includes review of chart, medications, test results, and follow up plan with the patient.   [place] Controlled Substance Database was reviewed by me.  This patient was seen by Sallyanne Kuster, FNP-C in collaboration with Dr. Beverely Risen as a part of collaborative care agreement.   Robbi Scurlock R. Tedd Sias, MSN, FNP-C Internal medicine

## 2023-02-21 ENCOUNTER — Telehealth: Payer: Self-pay

## 2023-02-21 NOTE — Telephone Encounter (Signed)
Completed P.A. for patient's Phentermine.

## 2023-02-24 ENCOUNTER — Other Ambulatory Visit: Payer: Self-pay | Admitting: Nurse Practitioner

## 2023-02-24 DIAGNOSIS — K580 Irritable bowel syndrome with diarrhea: Secondary | ICD-10-CM

## 2023-02-24 NOTE — Telephone Encounter (Signed)
Pt need refills 7/24 please send 1 month pt had appt with GI next week

## 2023-02-28 ENCOUNTER — Other Ambulatory Visit: Payer: Self-pay | Admitting: Nurse Practitioner

## 2023-02-28 DIAGNOSIS — G8929 Other chronic pain: Secondary | ICD-10-CM

## 2023-03-07 ENCOUNTER — Other Ambulatory Visit: Payer: Self-pay | Admitting: Nurse Practitioner

## 2023-03-07 DIAGNOSIS — B3731 Acute candidiasis of vulva and vagina: Secondary | ICD-10-CM

## 2023-03-08 ENCOUNTER — Encounter: Payer: Self-pay | Admitting: Nurse Practitioner

## 2023-03-08 ENCOUNTER — Other Ambulatory Visit: Payer: Self-pay

## 2023-03-08 MED ORDER — FLUCONAZOLE 150 MG PO TABS: ORAL_TABLET | ORAL | 1 refills | Status: DC

## 2023-03-24 ENCOUNTER — Encounter: Payer: Self-pay | Admitting: Nurse Practitioner

## 2023-04-03 NOTE — Progress Notes (Unsigned)
[name], PA-C [address]  [address]  [place], [place] [address]  Main: [phone]  Fax: [phone]   Gastroenterology Consultation  Referring Provider:     Sallyanne Kuster, NP Primary Care Physician:  Sallyanne Kuster, NP Primary Gastroenterologist:  [name], PA-C / Dr. [name]   Reason for Consultation:     Irritable bowel syndrome with diarrhea        HPI:   Yvette Humphrey is a 47 y.o. y/o female referred for consultation & management  by Sallyanne Kuster, NP.    She is referred to evaluate irritable bowel syndrome with diarrhea.  She Tried Xifaxan which did not help.  Previous cholecystectomy many years ago (2003).  Patient states she has had chronic diarrhea for many years.  She can have up to 10-12 episodes of diarrhea in a day.  She may have 1 solid bowel movement per month.  She has had episodes of nocturnal diarrhea and fecal incontinence.  Has to take Lomotil or Imodium prior to going out so that she will not have an accident.  She has not had any stool studies or GI workup for diarrhea.  She does have some intermittent lower abdominal cramping.  Denies weight loss, rectal bleeding, nausea, vomiting, fever, or chills..  No family history of colon cancer, IBD, or celiac.  No recent antibiotic use.  She has well water.  In April 2024 she went on a cruise to [place] and [place].    She saw Dr. Raford Pitcher at [facility] for hepatic steatosis in 2016.  Also saw Dr. Marva Panda in 2008.  No other GI evaluation.  No previous colonoscopy.  She reports having a negative Cologuard 2 years ago.  Labs 10/2022 showed normal CBC (hemoglobin 12.3), normal lipase and CMP.  Normal LFTs.  CT abdomen pelvis without contrast 10/2022 showed no acute abnormality.  Past Medical History:  Diagnosis Date   Complication of anesthesia    Headache    migraines, 1x/mo   Hyperlipidemia    Hypertension    Motion sickness    back seat cars   PONV (postoperative nausea and vomiting)     Tachycardia    Wears contact lenses     Past Surgical History:  Procedure Laterality Date   CHOLECYSTECTOMY     ENDOMETRIAL ABLATION     approx 2009   ESOPHAGOGASTRODUODENOSCOPY     REDUCTION MAMMAPLASTY Bilateral 2004   TONSILLECTOMY     TUBAL LIGATION     ULNAR NERVE TRANSPOSITION Left 06/08/2016   Procedure: SUBCUTANEOUS ANTERIOR ULNAR NERVE TRANSPOSITION LEFT ELBOW;  Surgeon: Christena Flake, MD;  Location: [facility];  Service: Orthopedics;  Laterality: Left;    Prior to Admission medications   Medication Sig Start Date End Date Taking? Authorizing Provider  ALPRAZolam (XANAX) 0.5 MG tablet TAKE 1 TABLET(0.5 MG) BY MOUTH DAILY AS NEEDED FOR ANXIETY 12/19/22   Sallyanne Kuster, NP  azelastine (ASTELIN) 0.1 % nasal spray Use 2 sq in each nostril every 6- 8 hrs qd 01/19/21   Lyndon Code, MD  buPROPion [facility]) 75 MG tablet Take 1 tablet (75 mg total) by mouth 2 (two) times daily. 12/19/22   Sallyanne Kuster, NP  cetirizine (ZYRTEC) 10 MG tablet Take 10 mg by mouth daily.    [provider]  cyanocobalamin 500 MCG tablet Take 500 mcg by mouth daily.    [provider]  diclofenac (VOLTAREN) 75 MG EC tablet TAKE 1 TABLET(75 MG) BY MOUTH TWICE DAILY 12/06/22  Sallyanne Kuster, NP  diphenoxylate-atropine (LOMOTIL) 2.5-0.025 MG tablet TAKE 1 TABLET BY MOUTH FOUR TIMES DAILY AS NEEDED FOR DIARRHEA OR LOOSE STOOLS 02/24/23   McDonough, Salomon Fick, PA-C  fluconazole (DIFLUCAN) 150 MG tablet Take 1 tab po  once repeat in 3 days If symptoms persists 03/08/23   Sallyanne Kuster, NP  fluticasone (FLOVENT HFA) 110 MCG/ACT inhaler Inhale 2 puffs into the lungs in the morning and at bedtime. 01/07/21   Lyndon Code, MD  gabapentin (NEURONTIN) 300 MG capsule TAKE 1 CAPSULE BY MOUTH IN THE MORNING AND EVENING AND 2 CAPS AT BEDTIME 03/01/23   Abernathy, Alyssa, NP  Lysine 1000 MG TABS Take by mouth daily as needed.    [provider]  metoprolol succinate (TOPROL-XL)  50 MG 24 hr tablet TAKE 1 TABLET BY MOUTH EVERY NIGHT AT BEDTIME FOR BLOOD PRESSURE 12/19/22   Sallyanne Kuster, NP  montelukast (SINGULAIR) 10 MG tablet TAKE 1 TABLET(10 MG) BY MOUTH AT BEDTIME 12/16/22   Abernathy, Arlyss Repress, NP  omeprazole (PRILOSEC) 20 MG capsule TAKE 1 CAPSULE(20 MG) BY MOUTH DAILY 03/01/23   Sallyanne Kuster, NP  ondansetron (ZOFRAN) 4 MG tablet TAKE 1 TABLET(4 MG) BY MOUTH EVERY 8 HOURS AS NEEDED FOR NAUSEA OR VOMITING 08/30/22   Lyndon Code, MD  phentermine (ADIPEX-P) 37.5 MG tablet Take 1 tablet (37.5 mg total) by mouth daily before breakfast. 02/20/23   Sallyanne Kuster, NP  rifaximin (XIFAXAN) 550 MG TABS tablet Take 1 tablet (550 mg total) by mouth 3 (three) times daily. 12/19/22   Sallyanne Kuster, NP  rosuvastatin (CRESTOR) 5 MG tablet Take 1 tablet (5 mg total) by mouth daily. 12/19/22   Sallyanne Kuster, NP  scopolamine (TRANSDERM-SCOP) 1 MG/3DAYS Place 1 patch (1.5 mg total) onto the skin every 3 (three) days. 09/20/22   Sallyanne Kuster, NP  sertraline (ZOLOFT) 100 MG tablet TAKE 1 TABLET(100 MG) BY MOUTH DAILY 12/19/22   Sallyanne Kuster, NP  topiramate (TOPAMAX) 25 MG tablet Take 1 tablet (25 mg total) by mouth 2 (two) times daily. 12/19/22   Sallyanne Kuster, NP  traMADol (ULTRAM) 50 MG tablet Take 1 tablet (50 mg total) by mouth every 6 (six) hours as needed for severe pain. 12/19/22   Sallyanne Kuster, NP  traZODone (DESYREL) 100 MG tablet TAKE 1 TO 2 TABLETS(100 TO 200 MG) BY MOUTH AT BEDTIME 02/20/23   Sallyanne Kuster, NP  triamcinolone cream (KENALOG) 0.1 % Apply topically twice daily as needed for itching and rash 09/16/21   Sallyanne Kuster, NP  Ubrogepant (UBRELVY) 100 MG TABS Take 1 tablet (100 mg total) by mouth daily as needed (acute migraine). 11/09/22   Sallyanne Kuster, NP    Family History  Problem Relation Age of Onset   Hypertension Mother    Hyperlipidemia Mother    Arrhythmia Mother    Heart attack Maternal Grandfather    Hyperlipidemia  Maternal Grandfather    COPD Maternal Grandfather    Breast cancer Paternal Grandmother      Social History   Tobacco Use   Smoking status: Never   Smokeless tobacco: Never  Vaping Use   Vaping status: Never Used  Substance Use Topics   Alcohol use: Yes    Alcohol/week: 1.0 standard drink of alcohol    Types: 1 Glasses of wine per week    Comment: occasionally   Drug use: No    Allergies as of 04/04/2023 - Review Complete 04/04/2023  Allergen Reaction Noted   Oxycodone Nausea And Vomiting 06/01/2016  Review of Systems:    All systems reviewed and negative except where noted in HPI.   Physical Exam:  BP 106/74 (BP Location: Left Arm, Patient Position: Sitting, Cuff Size: Normal)   Pulse 82   Temp 98.5 F (36.9 C) (Oral)   Ht 5\' 3"  (1.6 m)   Wt 180 lb 6.4 oz (81.8 kg)   BMI 31.96 kg/m  No LMP recorded. Patient has had an ablation.  General:   Alert,  Well-developed, well-nourished, pleasant and cooperative in NAD Lungs:  Respirations even and unlabored.  Clear throughout to auscultation.   No wheezes, crackles, or rhonchi. No acute distress. Heart:  Regular rate and rhythm; no murmurs, clicks, rubs, or gallops. Abdomen:  Normal bowel sounds.  No bruits.  Soft, and non-distended without masses, hepatosplenomegaly or hernias noted.  Mild Bilateral Lower Abdominal Tenderness.  No Upper abdominal Tenderness.  No guarding or rebound tenderness.    Neurologic:  Alert and oriented x3;  grossly normal neurologically. Psych:  Alert and cooperative. Normal mood and affect.  Imaging Studies: No results found.  Assessment and Plan:   Yvette Humphrey is a 47 y.o. y/o female has been referred for chronic diarrhea.  Differential includes irritable bowel syndrome, bile salt diarrhea postcholecystectomy, microscopic colitis, celiac, IBD, EPI, infectious diarrhea.  1.  Diarrhea Stool Studies: GI Pathogen Panal, C. Difficile Toxin PCR, Fecal Calprotectin, Fecal Elastase.   Labs:  Celiac  Colonoscopy to evaluate for microscopic colitis and less likely IBD.  2.  Irritable bowel syndrome, diarrhea predominant Vs. Bile Salt Diarrhea post CCY  Prescribed dicyclomine 10 Mg 1 tablet 3 times daily as needed for cramping.  After her colonoscopy, pending test results, consider trial of Colestid or cholestyramine to help control diarrhea.  3.  Colon cancer screening -due for first screening colonoscopy  Scheduling Colonoscopy I discussed risks of colonoscopy with patient to include risk of bleeding, colon perforation, and risk of sedation.  Patient expressed understanding and agrees to proceed with colonoscopy.   Follow up 4 weeks after Colonoscopy with TG.  [name], PA-C

## 2023-04-04 ENCOUNTER — Encounter: Payer: Self-pay | Admitting: Physician Assistant

## 2023-04-04 ENCOUNTER — Ambulatory Visit: Payer: BC Managed Care – PPO | Admitting: Physician Assistant

## 2023-04-04 VITALS — BP 106/74 | HR 82 | Temp 98.5°F | Ht 63.0 in | Wt 180.4 lb

## 2023-04-04 DIAGNOSIS — Z1211 Encounter for screening for malignant neoplasm of colon: Secondary | ICD-10-CM

## 2023-04-04 DIAGNOSIS — K580 Irritable bowel syndrome with diarrhea: Secondary | ICD-10-CM | POA: Diagnosis not present

## 2023-04-04 DIAGNOSIS — R197 Diarrhea, unspecified: Secondary | ICD-10-CM | POA: Diagnosis not present

## 2023-04-04 MED ORDER — NA SULFATE-K SULFATE-MG SULF 17.5-3.13-1.6 GM/177ML PO SOLN: 1.0000 | Freq: Once | ORAL | 0 refills | Status: AC

## 2023-04-04 MED ORDER — DICYCLOMINE HCL 10 MG PO CAPS: 10.0000 mg | ORAL_CAPSULE | Freq: Three times a day (TID) | ORAL | 2 refills | Status: DC

## 2023-04-06 LAB — CELIAC DISEASE AB SCREEN W/RFX
Antigliadin Abs, IgA: 3 units (ref 0–19)
IgA/Immunoglobulin A, Serum: 126 mg/dL (ref 87–352)
Transglutaminase IgA: <2 U/mL (ref 0–3)

## 2023-04-07 LAB — GI PROFILE, STOOL, PCR

## 2023-04-07 LAB — PANCREATIC ELASTASE, FECAL: Pancreatic Elastase, Fecal: >800 ug Elast./g (ref >200)

## 2023-04-07 LAB — CLOSTRIDIUM DIFFICILE BY PCR: Toxigenic C. Difficile by PCR: NEGATIVE

## 2023-04-07 LAB — CALPROTECTIN, FECAL: Calprotectin, Fecal: 41 ug/g (ref 0–120)

## 2023-04-17 ENCOUNTER — Encounter: Payer: Self-pay | Admitting: Nurse Practitioner

## 2023-04-17 ENCOUNTER — Ambulatory Visit (INDEPENDENT_AMBULATORY_CARE_PROVIDER_SITE_OTHER): Payer: BC Managed Care – PPO | Admitting: Nurse Practitioner

## 2023-04-17 ENCOUNTER — Other Ambulatory Visit: Payer: Self-pay | Admitting: Nurse Practitioner

## 2023-04-17 VITALS — BP 110/80 | HR 76 | Temp 98.2°F | Resp 16 | Ht 63.0 in | Wt 181.6 lb

## 2023-04-17 DIAGNOSIS — Z6832 Body mass index (BMI) 32.0-32.9, adult: Secondary | ICD-10-CM

## 2023-04-17 DIAGNOSIS — J301 Allergic rhinitis due to pollen: Secondary | ICD-10-CM

## 2023-04-17 DIAGNOSIS — M064 Inflammatory polyarthropathy: Secondary | ICD-10-CM

## 2023-04-17 DIAGNOSIS — F5101 Primary insomnia: Secondary | ICD-10-CM | POA: Diagnosis not present

## 2023-04-17 DIAGNOSIS — E6609 Other obesity due to excess calories: Secondary | ICD-10-CM

## 2023-04-17 DIAGNOSIS — G8929 Other chronic pain: Secondary | ICD-10-CM

## 2023-04-17 DIAGNOSIS — R635 Abnormal weight gain: Secondary | ICD-10-CM

## 2023-04-17 DIAGNOSIS — M542 Cervicalgia: Secondary | ICD-10-CM

## 2023-04-17 MED ORDER — PHENTERMINE HCL 37.5 MG PO TABS
37.5000 mg | ORAL_TABLET | Freq: Every day | ORAL | 1 refills | Status: DC
Start: 2023-04-17 — End: 2023-06-26

## 2023-04-17 MED ORDER — FLUTICASONE PROPIONATE 50 MCG/ACT NA SUSP
1.0000 | Freq: Every day | NASAL | 2 refills | Status: AC
Start: 2023-04-17 — End: ?

## 2023-04-17 MED ORDER — BELSOMRA 10 MG PO TABS
10.0000 mg | ORAL_TABLET | Freq: Every evening | ORAL | 3 refills | Status: DC | PRN
Start: 2023-04-17 — End: 2023-06-22

## 2023-04-17 MED ORDER — TOPIRAMATE 25 MG PO TABS
25.0000 mg | ORAL_TABLET | Freq: Two times a day (BID) | ORAL | 3 refills | Status: DC
Start: 2023-04-17 — End: 2023-09-21

## 2023-04-17 NOTE — Progress Notes (Signed)
[facility] [address] [place], [place] [address]  Internal MEDICINE  Office Visit Note  Patient Name: Yvette Humphrey  [id number]  [id number]  Date of Service: 04/17/2023  Chief Complaint  Patient presents with   Hyperlipidemia   Hypertension   Follow-up    HPI Yvette Humphrey presents for a follow-up visit for weight loss, insomnia, and allergic rhinitis.  Weight loss -- has been taking phentermine and topiramate for weight loss. Lost 7 lbs since her last office visit. Wants to continue the medication.  Insomnia -- trazodone is working, wants try something different.  Allergic rhinitis -- due for refills for fluticasone nasal spray    Current Medication: Outpatient Encounter Medications as of 04/17/2023  Medication Sig   buPROPion (WELLBUTRIN) 75 MG tablet Take 1 tablet (75 mg total) by mouth 2 (two) times daily.   cyanocobalamin 500 MCG tablet Take 500 mcg by mouth daily.   dicyclomine (BENTYL) 10 MG capsule Take 1 capsule (10 mg total) by mouth 3 (three) times daily before meals.   diphenoxylate-atropine (LOMOTIL) 2.5-0.025 MG tablet TAKE 1 TABLET BY MOUTH FOUR TIMES DAILY AS NEEDED FOR DIARRHEA OR LOOSE STOOLS   fluconazole (DIFLUCAN) 150 MG tablet Take 1 tab po  once repeat in 3 days If symptoms persists   fluticasone (FLONASE) 50 MCG/ACT nasal spray Place 1 spray into both nostrils daily.   gabapentin (NEURONTIN) 300 MG capsule TAKE 1 CAPSULE BY MOUTH IN THE MORNING AND EVENING AND 2 CAPS AT BEDTIME   metoprolol succinate (TOPROL-XL) 50 MG 24 hr tablet TAKE 1 TABLET BY MOUTH EVERY NIGHT AT BEDTIME FOR BLOOD PRESSURE   montelukast (SINGULAIR) 10 MG tablet TAKE 1 TABLET(10 MG) BY MOUTH AT BEDTIME   omeprazole (PRILOSEC) 20 MG capsule TAKE 1 CAPSULE(20 MG) BY MOUTH DAILY   ondansetron (ZOFRAN) 4 MG tablet TAKE 1 TABLET(4 MG) BY MOUTH EVERY 8 HOURS AS NEEDED FOR NAUSEA OR VOMITING   rosuvastatin (CRESTOR) 5 MG tablet Take 1 tablet (5 mg total) by mouth daily.   sertraline  (ZOLOFT) 100 MG tablet TAKE 1 TABLET(100 MG) BY MOUTH DAILY   Suvorexant (BELSOMRA) 10 MG TABS Take 1 tablet (10 mg total) by mouth at bedtime as needed (insomnia).   traMADol (ULTRAM) 50 MG tablet Take 1 tablet (50 mg total) by mouth every 6 (six) hours as needed for severe pain.   Ubrogepant (UBRELVY) 100 MG TABS Take 1 tablet (100 mg total) by mouth daily as needed (acute migraine).   [DISCONTINUED] ALPRAZolam (XANAX) 0.5 MG tablet TAKE 1 TABLET(0.5 MG) BY MOUTH DAILY AS NEEDED FOR ANXIETY   [DISCONTINUED] cetirizine (ZYRTEC) 10 MG tablet Take 10 mg by mouth daily.   [DISCONTINUED] diclofenac (VOLTAREN) 75 MG EC tablet TAKE 1 TABLET(75 MG) BY MOUTH TWICE DAILY   [DISCONTINUED] phentermine (ADIPEX-P) 37.5 MG tablet Take 1 tablet (37.5 mg total) by mouth daily before breakfast.   [DISCONTINUED] topiramate (TOPAMAX) 25 MG tablet Take 1 tablet (25 mg total) by mouth 2 (two) times daily.   [DISCONTINUED] traZODone (DESYREL) 100 MG tablet TAKE 1 TO 2 TABLETS(100 TO 200 MG) BY MOUTH AT BEDTIME   phentermine (ADIPEX-P) 37.5 MG tablet Take 1 tablet (37.5 mg total) by mouth daily before breakfast.   topiramate (TOPAMAX) 25 MG tablet Take 1 tablet (25 mg total) by mouth 2 (two) times daily.   [DISCONTINUED] azelastine (ASTELIN) 0.1 % nasal spray Use 2 sq in each nostril every 6- 8 hrs qd   [DISCONTINUED] fluticasone (FLOVENT HFA) 110 MCG/ACT inhaler  Inhale 2 puffs into the lungs in the morning and at bedtime.   [DISCONTINUED] Lysine 1000 MG TABS Take by mouth daily as needed.   [DISCONTINUED] scopolamine (TRANSDERM-SCOP) 1 MG/3DAYS Place 1 patch (1.5 mg total) onto the skin every 3 (three) days.   [DISCONTINUED] triamcinolone cream (KENALOG) 0.1 % Apply topically twice daily as needed for itching and rash   No facility-administered encounter medications on file as of 04/17/2023.    Surgical History: Past Surgical History:  Procedure Laterality Date   CHOLECYSTECTOMY     COLONOSCOPY WITH PROPOFOL N/A  06/01/2023   Procedure: COLONOSCOPY WITH PROPOFOL;  Surgeon: [name], MD;  Location: [facility];  Service: Endoscopy;  Laterality: N/A;   ENDOMETRIAL ABLATION     approx 2009   ESOPHAGOGASTRODUODENOSCOPY     POLYPECTOMY  06/01/2023   Procedure: POLYPECTOMY;  Surgeon: [name], MD;  Location: [facility];  Service: Endoscopy;;   REDUCTION MAMMAPLASTY Bilateral 2004   TONSILLECTOMY     TUBAL LIGATION     ULNAR NERVE TRANSPOSITION Left 06/08/2016   Procedure: SUBCUTANEOUS ANTERIOR ULNAR NERVE TRANSPOSITION LEFT ELBOW;  Surgeon: Christena Flake, MD;  Location: [facility];  Service: Orthopedics;  Laterality: Left;    Medical History: Past Medical History:  Diagnosis Date   Complication of anesthesia    Headache    migraines, 1x/mo   Hyperlipidemia    Hypertension    Motion sickness    back seat cars   Neuropathy    PONV (postoperative nausea and vomiting)    Tachycardia    Wears contact lenses     Family History: Family History  Problem Relation Age of Onset   Hypertension Mother    Hyperlipidemia Mother    Arrhythmia Mother    Heart attack Maternal Grandfather    Hyperlipidemia Maternal Grandfather    COPD Maternal Grandfather    Breast cancer Paternal Grandmother     Social History   Socioeconomic History   Marital status: Married    Spouse name: Not on file   Number of children: Not on file   Years of education: Not on file   Highest education level: Not on file  Occupational History   Not on file  Tobacco Use   Smoking status: Never   Smokeless tobacco: Never  Vaping Use   Vaping status: Never Used  Substance and Sexual Activity   Alcohol use: Yes    Alcohol/week: 1.0 standard drink of alcohol    Types: 1 Glasses of wine per week    Comment: occasionally   Drug use: No   Sexual activity: Not on file  Other Topics Concern   Not on file  Social History Narrative   Not on file   Social Determinants of Health   Financial Resource  Strain: Not on file  Food Insecurity: Not on file  Transportation Needs: Not on file  Physical Activity: Not on file  Stress: Not on file  Social Connections: Not on file  Intimate Partner Violence: Not on file      Review of Systems  Constitutional:  Positive for appetite change and unexpected weight change. Negative for chills and fatigue.  HENT:  Negative for congestion, postnasal drip, rhinorrhea, sneezing and sore throat.   Respiratory: Negative.  Negative for cough, chest tightness, shortness of breath and wheezing.   Cardiovascular: Negative.  Negative for chest pain and palpitations.  Gastrointestinal:  Positive for diarrhea. Negative for abdominal pain, constipation, nausea and vomiting.  Genitourinary:  Negative for dysuria  and frequency.  Musculoskeletal:  Negative for arthralgias, back pain, joint swelling and neck pain.  Skin:  Negative for rash.  Neurological: Negative.   Hematological:  Negative for adenopathy. Does not bruise/bleed easily.  Psychiatric/Behavioral:  Positive for sleep disturbance. Negative for behavioral problems (Depression) and suicidal ideas. The patient is not nervous/anxious.     Vital Signs: BP 110/80   Pulse 76   Temp 98.2 F (36.8 C)   Resp 16   Ht 5\' 3"  (1.6 m)   Wt 181 lb 9.6 oz (82.4 kg)   SpO2 97%   BMI 32.17 kg/m    Physical Exam Vitals reviewed.  Constitutional:      General: She is not in acute distress.    Appearance: Normal appearance. She is obese. She is not ill-appearing.  HENT:     Head: Normocephalic and atraumatic.  Eyes:     Pupils: Pupils are equal, round, and reactive to light.  Cardiovascular:     Rate and Rhythm: Normal rate and regular rhythm.  Pulmonary:     Effort: Pulmonary effort is normal. No respiratory distress.  Neurological:     Mental Status: She is alert and oriented to person, place, and time.  Psychiatric:        Mood and Affect: Mood normal.        Behavior: Behavior normal.         Assessment/Plan: 1. Non-seasonal allergic rhinitis due to pollen Continue fluticasone nasal spray as prescribed.  - fluticasone (FLONASE) 50 MCG/ACT nasal spray; Place 1 spray into both nostrils daily.  Dispense: 16 g; Refill: 2  2. Abnormal weight gain Continue phentermine as prescribed. Follow up in 8 weeks  - phentermine (ADIPEX-P) 37.5 MG tablet; Take 1 tablet (37.5 mg total) by mouth daily before breakfast.  Dispense: 30 tablet; Refill: 1  3. Class 1 obesity due to excess calories without serious comorbidity with body mass index (BMI) of 32.0 to 32.9 in adult Continue phentermine and topiramate as prescribed. Follow up in 8 weeks  - phentermine (ADIPEX-P) 37.5 MG tablet; Take 1 tablet (37.5 mg total) by mouth daily before breakfast.  Dispense: 30 tablet; Refill: 1 - topiramate (TOPAMAX) 25 MG tablet; Take 1 tablet (25 mg total) by mouth 2 (two) times daily.  Dispense: 60 tablet; Refill: 3  4. Primary insomnia Try belsomra as prescribed.  - Suvorexant (BELSOMRA) 10 MG TABS; Take 1 tablet (10 mg total) by mouth at bedtime as needed (insomnia).  Dispense: 30 tablet; Refill: 3   General Counseling: janavia spizzirri understanding of the findings of todays visit and agrees with plan of treatment. I have discussed any further diagnostic evaluation that may be needed or ordered today. We also reviewed her medications today. she has been encouraged to call the office with any questions or concerns that should arise related to todays visit.    No orders of the defined types were placed in this encounter.   Meds ordered this encounter  Medications   phentermine (ADIPEX-P) 37.5 MG tablet    Sig: Take 1 tablet (37.5 mg total) by mouth daily before breakfast.    Dispense:  30 tablet    Refill:  1    May start with 1/2 tablet daily.   Suvorexant (BELSOMRA) 10 MG TABS    Sig: Take 1 tablet (10 mg total) by mouth at bedtime as needed (insomnia).    Dispense:  30 tablet     Refill:  3    Discontinue trazodone, fill new script  now   topiramate (TOPAMAX) 25 MG tablet    Sig: Take 1 tablet (25 mg total) by mouth 2 (two) times daily.    Dispense:  60 tablet    Refill:  3    Fill new script today   fluticasone (FLONASE) 50 MCG/ACT nasal spray    Sig: Place 1 spray into both nostrils daily.    Dispense:  16 g    Refill:  2    Return in about 8 weeks (around 06/12/2023) for F/U, Weight loss, Damari Suastegui PCP.   Total time spent:30 Minutes Time spent includes review of chart, medications, test results, and follow up plan with the patient.   [place] Controlled Substance Database was reviewed by me.  This patient was seen by Sallyanne Kuster, FNP-C in collaboration with Dr. Beverely Risen as a part of collaborative care agreement.   Camellia Popescu R. Tedd Sias, MSN, FNP-C Internal medicine

## 2023-05-02 ENCOUNTER — Other Ambulatory Visit: Payer: Self-pay | Admitting: Nurse Practitioner

## 2023-05-02 ENCOUNTER — Telehealth: Payer: Self-pay

## 2023-05-02 DIAGNOSIS — Z79899 Other long term (current) drug therapy: Secondary | ICD-10-CM

## 2023-05-02 NOTE — Telephone Encounter (Signed)
PA was done and approved for Belsomra. Patient notified.

## 2023-05-04 ENCOUNTER — Encounter: Payer: Self-pay | Admitting: Nurse Practitioner

## 2023-05-06 IMAGING — US US EXTREM UP*L* LTD
1 series · 6 of 6 positions shown · non-contrast
Comparison: None.

CLINICAL DATA: Tingling in the fourth and fifth digits.

EXAM:
ULTRASOUND LEFT UPPER EXTREMITY LIMITED
TECHNIQUE: Ultrasound examination of the upper extremity soft tissues was
performed in the area of clinical concern.

[Series 1: us extrem low bilat ltd · 6 of 6 slices shown]
[im 1/6]
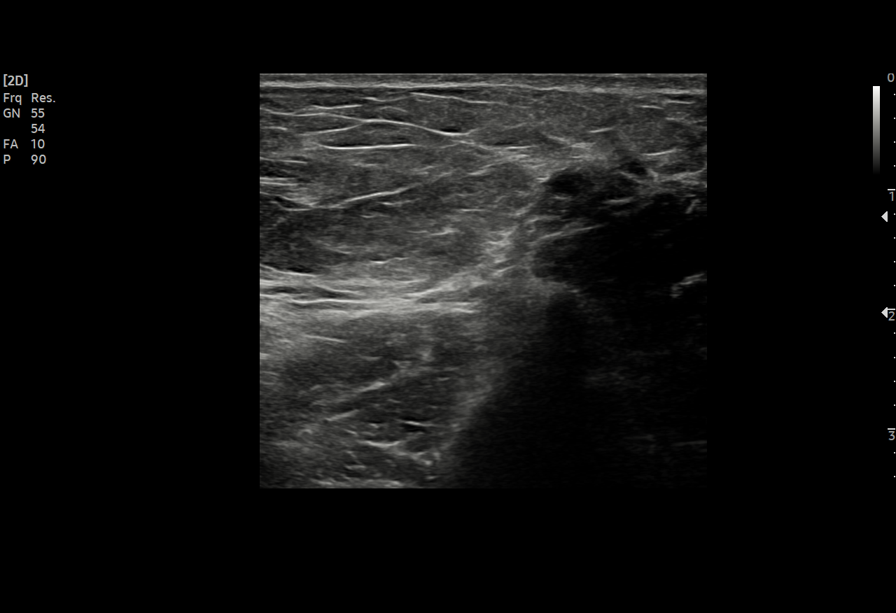
[im 2/6]
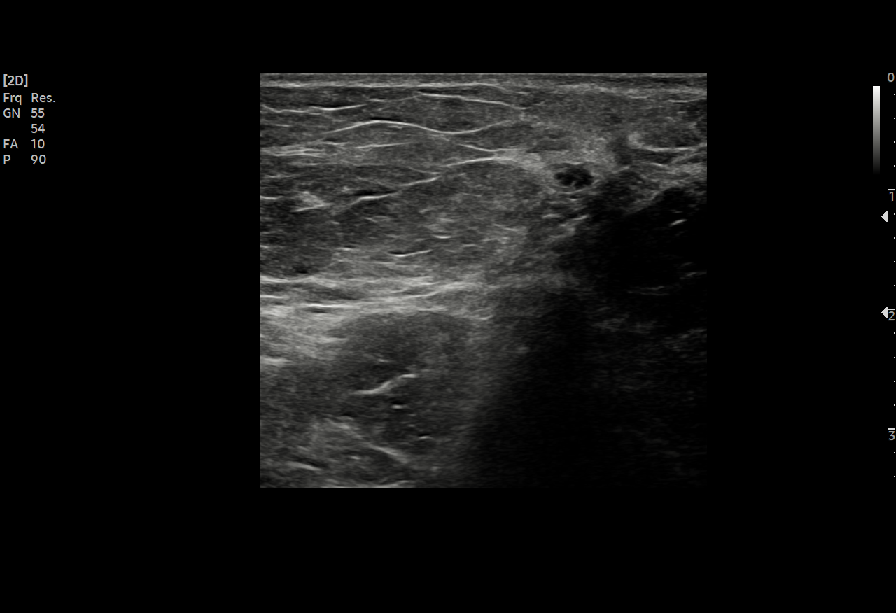
[im 3/6]
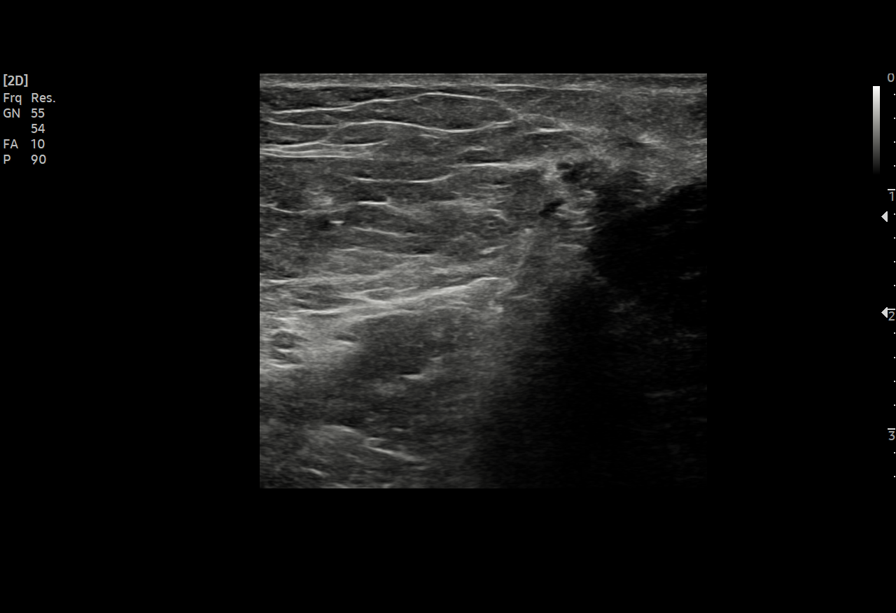
[im 4/6]
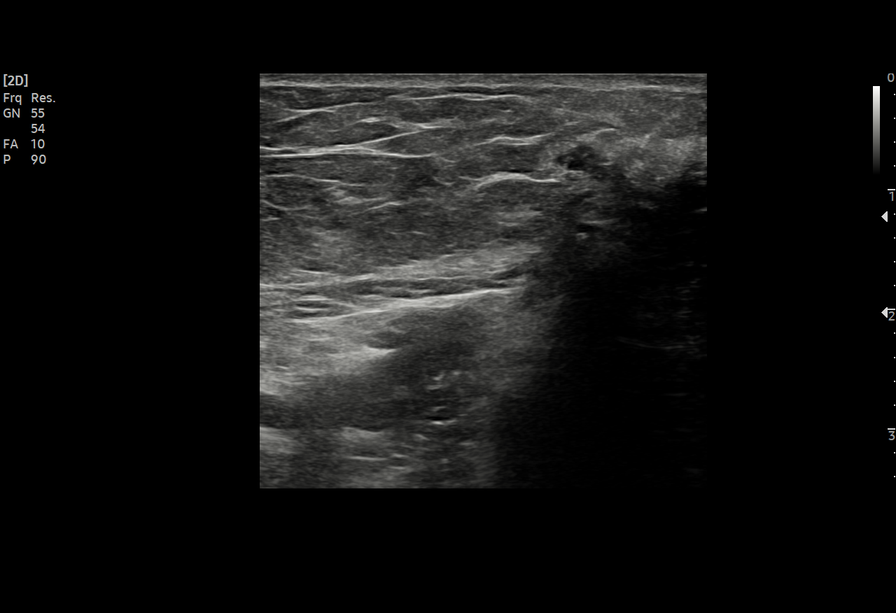
[im 5/6]
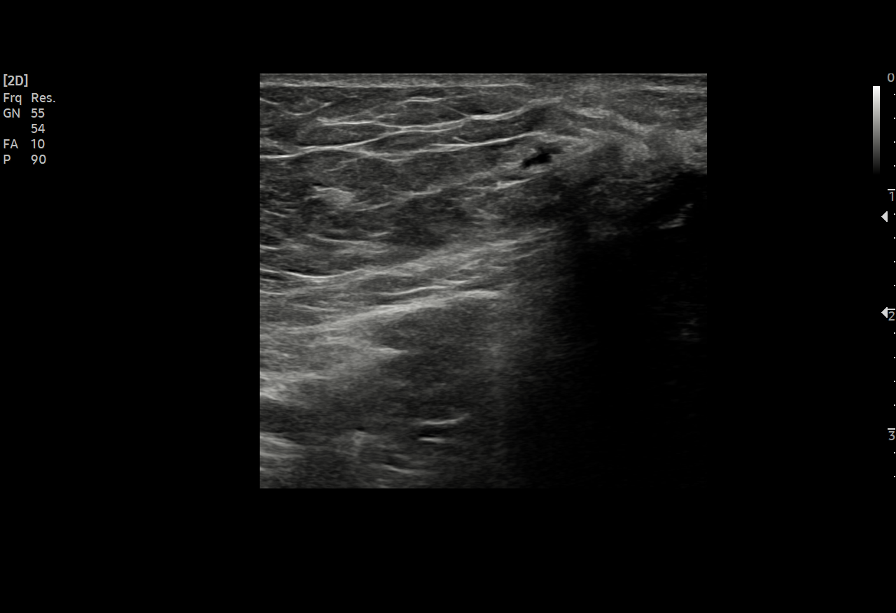
[im 6/6]
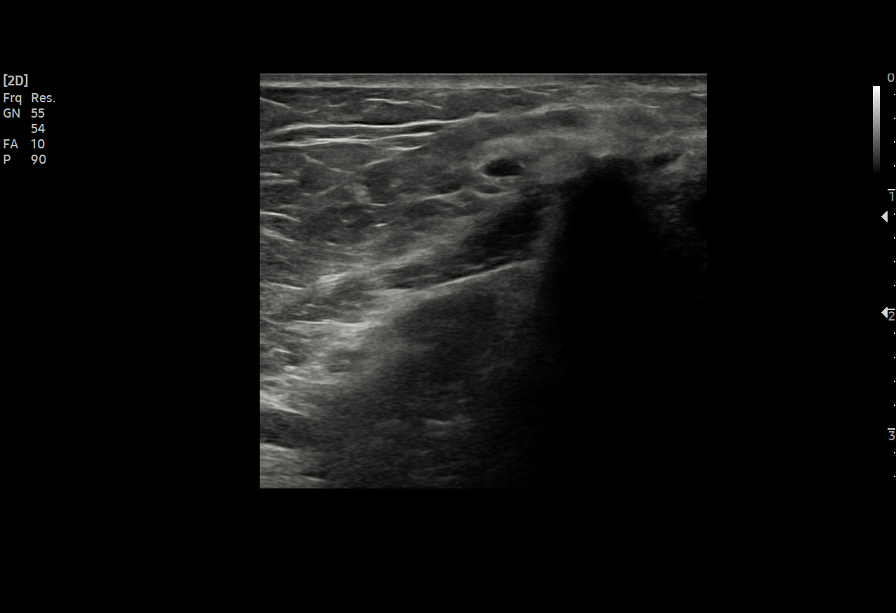

[6 of 6 positions shown; findings below may reference images not displayed]

FINDINGS: Real-time sonography around the left elbow was performed with a
high-frequency linear transducer. Ulnar nerve is located within the
subcutaneous fat in the posteromedial aspect of the elbow. No focal
abnormality is identified.
IMPRESSION: Prior left ulnar nerve release and transposition without focal
abnormality. Recommend further evaluation with an MRI of the left
elbow with and without contrast.

## 2023-05-10 ENCOUNTER — Other Ambulatory Visit: Payer: Self-pay | Admitting: Nurse Practitioner

## 2023-05-10 DIAGNOSIS — Z1231 Encounter for screening mammogram for malignant neoplasm of breast: Secondary | ICD-10-CM

## 2023-05-12 DIAGNOSIS — R293 Abnormal posture: Secondary | ICD-10-CM | POA: Insufficient documentation

## 2023-05-12 DIAGNOSIS — M25552 Pain in left hip: Secondary | ICD-10-CM | POA: Insufficient documentation

## 2023-05-12 HISTORY — DX: Pain in left hip: M25.552

## 2023-05-12 HISTORY — DX: Abnormal posture: R29.3

## 2023-05-17 ENCOUNTER — Ambulatory Visit
Admission: RE | Admit: 2023-05-17 | Discharge: 2023-05-17 | Disposition: A | Discharge status: Home / Self Care | Payer: BC Managed Care – PPO | Source: Ambulatory Visit | Attending: Nurse Practitioner | Admitting: Nurse Practitioner

## 2023-05-17 DIAGNOSIS — Z1231 Encounter for screening mammogram for malignant neoplasm of breast: Secondary | ICD-10-CM | POA: Diagnosis present

## 2023-06-01 ENCOUNTER — Ambulatory Visit: Payer: BC Managed Care – PPO | Admitting: Certified Registered"

## 2023-06-01 ENCOUNTER — Ambulatory Visit
Admission: RE | Admit: 2023-06-01 | Discharge: 2023-06-01 | Disposition: A | Discharge status: Home / Self Care | Payer: BC Managed Care – PPO | Attending: Gastroenterology | Admitting: Gastroenterology

## 2023-06-01 ENCOUNTER — Encounter
Admission: RE | Disposition: A | Discharge status: Home / Self Care | Payer: Self-pay | Source: Home / Self Care | Attending: Gastroenterology

## 2023-06-01 ENCOUNTER — Encounter: Payer: Self-pay | Admitting: Gastroenterology

## 2023-06-01 ENCOUNTER — Other Ambulatory Visit: Payer: Self-pay

## 2023-06-01 DIAGNOSIS — Z9049 Acquired absence of other specified parts of digestive tract: Secondary | ICD-10-CM | POA: Insufficient documentation

## 2023-06-01 DIAGNOSIS — G43909 Migraine, unspecified, not intractable, without status migrainosus: Secondary | ICD-10-CM | POA: Diagnosis not present

## 2023-06-01 DIAGNOSIS — Z1211 Encounter for screening for malignant neoplasm of colon: Secondary | ICD-10-CM | POA: Diagnosis present

## 2023-06-01 DIAGNOSIS — E785 Hyperlipidemia, unspecified: Secondary | ICD-10-CM | POA: Diagnosis not present

## 2023-06-01 DIAGNOSIS — G629 Polyneuropathy, unspecified: Secondary | ICD-10-CM | POA: Insufficient documentation

## 2023-06-01 DIAGNOSIS — I10 Essential (primary) hypertension: Secondary | ICD-10-CM | POA: Insufficient documentation

## 2023-06-01 DIAGNOSIS — K635 Polyp of colon: Secondary | ICD-10-CM | POA: Insufficient documentation

## 2023-06-01 HISTORY — DX: Polyneuropathy, unspecified: G62.9

## 2023-06-01 HISTORY — PX: POLYPECTOMY: SHX5525

## 2023-06-01 HISTORY — PX: COLONOSCOPY WITH PROPOFOL: SHX5780

## 2023-06-01 LAB — POCT PREGNANCY, URINE: Preg Test, Ur: NEGATIVE

## 2023-06-01 SURGERY — COLONOSCOPY WITH PROPOFOL
Anesthesia: General

## 2023-06-01 MED ORDER — LIDOCAINE HCL (CARDIAC) PF 100 MG/5ML IV SOSY
PREFILLED_SYRINGE | INTRAVENOUS | Status: DC | PRN
  Administered 2023-06-01: 100 mg via INTRAVENOUS

## 2023-06-01 MED ORDER — GLYCOPYRROLATE 0.2 MG/ML IJ SOLN
INTRAMUSCULAR | Status: DC | PRN
  Administered 2023-06-01: .2 mg via INTRAVENOUS

## 2023-06-01 MED ORDER — SODIUM CHLORIDE 0.9 % IV SOLN: INTRAVENOUS | Status: DC | PRN

## 2023-06-01 MED ORDER — PROPOFOL 500 MG/50ML IV EMUL
INTRAVENOUS | Status: DC | PRN
  Administered 2023-06-01: 165 ug/kg/min via INTRAVENOUS

## 2023-06-01 MED ORDER — PROPOFOL 10 MG/ML IV BOLUS
INTRAVENOUS | Status: DC | PRN
  Administered 2023-06-01: 30 mg via INTRAVENOUS
  Administered 2023-06-01: 20 mg via INTRAVENOUS
  Administered 2023-06-01: 70 mg via INTRAVENOUS
  Administered 2023-06-01: 20 mg via INTRAVENOUS

## 2023-06-01 MED ORDER — MIDAZOLAM HCL 2 MG/2ML IJ SOLN
INTRAMUSCULAR | Status: AC
Start: 2023-06-01 — End: ?
  Filled 2023-06-01: qty 2

## 2023-06-01 MED ORDER — MIDAZOLAM HCL 2 MG/2ML IJ SOLN
INTRAMUSCULAR | Status: DC | PRN
  Administered 2023-06-01: 2 mg via INTRAVENOUS

## 2023-06-01 NOTE — Anesthesia Postprocedure Evaluation (Signed)
Anesthesia Post Note  Patient: Yvette Humphrey  Procedure(s) Performed: COLONOSCOPY WITH PROPOFOL POLYPECTOMY  Patient location during evaluation: Endoscopy Anesthesia Type: General Level of consciousness: awake and alert Pain management: pain level controlled Vital Signs Assessment: post-procedure vital signs reviewed and stable Respiratory status: spontaneous breathing, nonlabored ventilation, respiratory function stable and patient connected to nasal cannula oxygen Cardiovascular status: blood pressure returned to baseline and stable Postop Assessment: no apparent nausea or vomiting Anesthetic complications: no   No notable events documented.   Last Vitals:  Vitals:   06/01/23 0917 06/01/23 0927  BP: 105/64 111/69  Pulse: 94 90  Resp:    Temp: (!) 36.1 C (!) 36.1 C  SpO2: 100% 98%    Last Pain:  Vitals:   06/01/23 0927  TempSrc: Temporal  PainSc: 0-No pain                 Cleda Mccreedy Nuriya Stuck

## 2023-06-01 NOTE — H&P (Signed)
Yvette Minium, MD [facility] [address], [address] [place], [place] [address] Phone: [phone] Fax : [phone]  Primary Care Physician:  Yvette Kuster, NP Primary Gastroenterologist:  Dr. Servando Snare  Pre-Procedure History & Physical: HPI:  Yvette Humphrey is a 47 y.o. female is here for a screening colonoscopy.   Past Medical History:  Diagnosis Date   Complication of anesthesia    Headache    migraines, 1x/mo   Hyperlipidemia    Hypertension    Motion sickness    back seat cars   Neuropathy    PONV (postoperative nausea and vomiting)    Tachycardia    Wears contact lenses     Past Surgical History:  Procedure Laterality Date   CHOLECYSTECTOMY     ENDOMETRIAL ABLATION     approx 2009   ESOPHAGOGASTRODUODENOSCOPY     REDUCTION MAMMAPLASTY Bilateral 2004   TONSILLECTOMY     TUBAL LIGATION     ULNAR NERVE TRANSPOSITION Left 06/08/2016   Procedure: SUBCUTANEOUS ANTERIOR ULNAR NERVE TRANSPOSITION LEFT ELBOW;  Surgeon: Christena Flake, MD;  Location: [facility];  Service: Orthopedics;  Laterality: Left;    Prior to Admission medications   Medication Sig Start Date End Date Taking? Authorizing Provider  ALPRAZolam (XANAX) 0.5 MG tablet TAKE 1 TABLET(0.5 MG) BY MOUTH DAILY AS NEEDED FOR ANXIETY 05/02/23  Yes Abernathy, Alyssa, NP  buPROPion (WELLBUTRIN) 75 MG tablet Take 1 tablet (75 mg total) by mouth 2 (two) times daily. 12/19/22  Yes Abernathy, Arlyss Repress, NP  fluticasone (FLONASE) 50 MCG/ACT nasal spray Place 1 spray into both nostrils daily. 04/17/23  Yes Abernathy, Alyssa, NP  gabapentin (NEURONTIN) 300 MG capsule TAKE 1 CAPSULE BY MOUTH IN THE MORNING AND EVENING AND 2 CAPS AT BEDTIME 03/01/23  Yes Abernathy, Alyssa, NP  metoprolol succinate (TOPROL-XL) 50 MG 24 hr tablet TAKE 1 TABLET BY MOUTH EVERY NIGHT AT BEDTIME FOR BLOOD PRESSURE 12/19/22  Yes Abernathy, Alyssa, NP  montelukast (SINGULAIR) 10 MG tablet TAKE 1 TABLET(10 MG) BY MOUTH AT BEDTIME 12/16/22  Yes Abernathy,  Alyssa, NP  omeprazole (PRILOSEC) 20 MG capsule TAKE 1 CAPSULE(20 MG) BY MOUTH DAILY 03/01/23  Yes Abernathy, Arlyss Repress, NP  phentermine (ADIPEX-P) 37.5 MG tablet Take 1 tablet (37.5 mg total) by mouth daily before breakfast. 04/17/23  Yes Abernathy, Alyssa, NP  rosuvastatin (CRESTOR) 5 MG tablet Take 1 tablet (5 mg total) by mouth daily. 12/19/22  Yes Abernathy, Arlyss Repress, NP  sertraline (ZOLOFT) 100 MG tablet TAKE 1 TABLET(100 MG) BY MOUTH DAILY 12/19/22  Yes Abernathy, Alyssa, NP  cyanocobalamin 500 MCG tablet Take 500 mcg by mouth daily.    [provider]  diclofenac (VOLTAREN) 75 MG EC tablet TAKE 1 TABLET(75 MG) BY MOUTH TWICE DAILY 04/18/23   Yvette Kuster, NP  dicyclomine (BENTYL) 10 MG capsule Take 1 capsule (10 mg total) by mouth 3 (three) times daily before meals. 04/04/23 07/03/23  Yvette Amy, PA-C  diphenoxylate-atropine (LOMOTIL) 2.5-0.025 MG tablet TAKE 1 TABLET BY MOUTH FOUR TIMES DAILY AS NEEDED FOR DIARRHEA OR LOOSE STOOLS 02/24/23   McDonough, Yvette Fick, PA-C  fluconazole (DIFLUCAN) 150 MG tablet Take 1 tab po  once repeat in 3 days If symptoms persists 03/08/23   Yvette Kuster, NP  ondansetron (ZOFRAN) 4 MG tablet TAKE 1 TABLET(4 MG) BY MOUTH EVERY 8 HOURS AS NEEDED FOR NAUSEA OR VOMITING 08/30/22   Yvette Code, MD  Suvorexant (BELSOMRA) 10 MG TABS Take 1 tablet (10 mg total) by mouth at bedtime as needed (  insomnia). 04/17/23   Yvette Kuster, NP  topiramate (TOPAMAX) 25 MG tablet Take 1 tablet (25 mg total) by mouth 2 (two) times daily. 04/17/23   Yvette Kuster, NP  traMADol (ULTRAM) 50 MG tablet Take 1 tablet (50 mg total) by mouth every 6 (six) hours as needed for severe pain. 12/19/22   Yvette Kuster, NP  Ubrogepant (UBRELVY) 100 MG TABS Take 1 tablet (100 mg total) by mouth daily as needed (acute migraine). 11/09/22   Yvette Kuster, NP    Allergies as of 04/04/2023 - Review Complete 04/04/2023  Allergen Reaction Noted   Oxycodone Nausea And Vomiting  06/01/2016    Family History  Problem Relation Age of Onset   Hypertension Mother    Hyperlipidemia Mother    Arrhythmia Mother    Heart attack Maternal Grandfather    Hyperlipidemia Maternal Grandfather    COPD Maternal Grandfather    Breast cancer Paternal Grandmother     Social History   Socioeconomic History   Marital status: Married    Spouse name: Not on file   Number of children: Not on file   Years of education: Not on file   Highest education level: Not on file  Occupational History   Not on file  Tobacco Use   Smoking status: Never   Smokeless tobacco: Never  Vaping Use   Vaping status: Never Used  Substance and Sexual Activity   Alcohol use: Yes    Alcohol/week: 1.0 standard drink of alcohol    Types: 1 Glasses of wine per week    Comment: occasionally   Drug use: No   Sexual activity: Not on file  Other Topics Concern   Not on file  Social History Narrative   Not on file   Social Determinants of Health   Financial Resource Strain: Not on file  Food Insecurity: Not on file  Transportation Needs: Not on file  Physical Activity: Not on file  Stress: Not on file  Social Connections: Not on file  Intimate Partner Violence: Not on file    Review of Systems: See HPI, otherwise negative ROS  Physical Exam: BP 111/69   Pulse 90   Temp (!) 97 F (36.1 C) (Temporal)   Resp 16   Ht 5\' 3"  (1.6 m)   Wt 78.7 kg   SpO2 98%   BMI 30.75 kg/m  General:   Alert,  pleasant and cooperative in NAD Head:  Normocephalic and atraumatic. Neck:  Supple; no masses or thyromegaly. Lungs:  Clear throughout to auscultation.    Heart:  Regular rate and rhythm. Abdomen:  Soft, nontender and nondistended. Normal bowel sounds, without guarding, and without rebound.   Neurologic:  Alert and  oriented x4;  grossly normal neurologically.  Impression/Plan: Yvette Humphrey is now here to undergo a screening colonoscopy.  Risks, benefits, and alternatives regarding  colonoscopy have been reviewed with the patient.  Questions have been answered.  All parties agreeable.

## 2023-06-01 NOTE — Plan of Care (Signed)
[facility] Tonsillectomy/Adenoidectomy, Postoperative PEDS care plan entered in error.

## 2023-06-01 NOTE — Transfer of Care (Signed)
Immediate Anesthesia Transfer of Care Note  Patient: Yvette Humphrey  Procedure(s) Performed: COLONOSCOPY WITH PROPOFOL POLYPECTOMY  Patient Location: Endoscopy Unit  Anesthesia Type:General  Level of Consciousness: drowsy and patient cooperative  Airway & Oxygen Therapy: Patient Spontanous Breathing and Patient connected to face mask oxygen  Post-op Assessment: Report given to RN and Post -op Vital signs reviewed and stable  Post vital signs: Reviewed and stable  Last Vitals:  Vitals Value Taken Time  BP 106/67 06/01/23 0909  Temp 36.1 C 06/01/23 0907  Pulse 96 06/01/23 0910  Resp    SpO2 100 % 06/01/23 0910  Vitals shown include unfiled device data.  Last Pain:  Vitals:   06/01/23 0907  TempSrc: Temporal  PainSc: Asleep         Complications: No notable events documented.

## 2023-06-01 NOTE — Anesthesia Preprocedure Evaluation (Signed)
Anesthesia Evaluation  Patient identified by MRN, date of birth, ID band Patient awake    Reviewed: Allergy & Precautions, NPO status , Patient's Chart, lab work & pertinent test results  History of Anesthesia Complications (+) PONV and history of anesthetic complications  Airway Mallampati: III  TM Distance: <3 FB Neck ROM: full    Dental  (+) Chipped   Pulmonary neg pulmonary ROS, neg shortness of breath   Pulmonary exam normal        Cardiovascular Exercise Tolerance: Good hypertension, (-) angina Normal cardiovascular exam     Neuro/Psych  Headaches PSYCHIATRIC DISORDERS       Neuromuscular disease    GI/Hepatic negative GI ROS, Neg liver ROS,neg GERD  ,,  Endo/Other  negative endocrine ROS    Renal/GU negative Renal ROS  negative genitourinary   Musculoskeletal   Abdominal   Peds  Hematology negative hematology ROS (+)   Anesthesia Other Findings Past Medical History: No date: Complication of anesthesia No date: Headache     Comment:  migraines, 1x/mo No date: Hyperlipidemia No date: Hypertension No date: Motion sickness     Comment:  back seat cars No date: Neuropathy No date: PONV (postoperative nausea and vomiting) No date: Tachycardia No date: Wears contact lenses  Past Surgical History: No date: CHOLECYSTECTOMY No date: ENDOMETRIAL ABLATION     Comment:  approx 2009 No date: ESOPHAGOGASTRODUODENOSCOPY 2004: REDUCTION MAMMAPLASTY; Bilateral No date: TONSILLECTOMY No date: TUBAL LIGATION 06/08/2016: ULNAR NERVE TRANSPOSITION; Left     Comment:  Procedure: SUBCUTANEOUS ANTERIOR ULNAR NERVE               TRANSPOSITION LEFT ELBOW;  Surgeon: Christena Flake, MD;                Location: [facility];  Service: Orthopedics;                Laterality: Left;  BMI    Body Mass Index: 30.75 kg/m      Reproductive/Obstetrics negative OB ROS                              Anesthesia Physical Anesthesia Plan  ASA: 3  Anesthesia Plan: General   Post-op Pain Management:    Induction: Intravenous  PONV Risk Score and Plan: Propofol infusion and TIVA  Airway Management Planned: Natural Airway and Nasal Cannula  Additional Equipment:   Intra-op Plan:   Post-operative Plan:   Informed Consent: I have reviewed the patients History and Physical, chart, labs and discussed the procedure including the risks, benefits and alternatives for the proposed anesthesia with the patient or authorized representative who has indicated his/her understanding and acceptance.     Dental Advisory Given  Plan Discussed with: Anesthesiologist, CRNA and Surgeon  Anesthesia Plan Comments: (Patient consented for risks of anesthesia including but not limited to:  - adverse reactions to medications - risk of airway placement if required - damage to eyes, teeth, lips or other oral mucosa - nerve damage due to positioning  - sore throat or hoarseness - Damage to heart, brain, nerves, lungs, other parts of body or loss of life  Patient voiced understanding and assent.)       Anesthesia Quick Evaluation

## 2023-06-01 NOTE — Op Note (Signed)
[facility] Patient Name: Yvette Humphrey Procedure Date: 06/01/2023 8:39 AM MRN: [id number] Account #: [id number] Date of Birth: 25-Apr-1976 Admit Type: Outpatient Age: 47 Room: [facility] ENDO ROOM 4 Gender: Female Note Status: Finalized Instrument Name: Prentice Docker [id number] Procedure:             Colonoscopy Indications:           Screening for colorectal malignant neoplasm Providers:             [name] MD, MD Referring MD:          Sallyanne Kuster (Referring MD) Medicines:             Propofol per Anesthesia Complications:         No immediate complications. Procedure:             Pre-Anesthesia Assessment:                        - Prior to the procedure, a History and Physical was                         performed, and patient medications and allergies were                         reviewed. The patient's tolerance of previous                         anesthesia was also reviewed. The risks and benefits                         of the procedure and the sedation options and risks                         were discussed with the patient. All questions were                         answered, and informed consent was obtained. Prior                         Anticoagulants: The patient has taken no anticoagulant                         or antiplatelet agents. ASA Grade Assessment: II - A                         patient with mild systemic disease. After reviewing                         the risks and benefits, the patient was deemed in                         satisfactory condition to undergo the procedure.                        After obtaining informed consent, the colonoscope was                         passed under direct vision. Throughout the procedure,  the patient's blood pressure, pulse, and oxygen                         saturations were monitored continuously. The                         Colonoscope was introduced through  the anus and                         advanced to the the cecum, identified by appendiceal                         orifice and ileocecal valve. The colonoscopy was                         performed without difficulty. The patient tolerated                         the procedure well. The quality of the bowel                         preparation was excellent. Findings:      The perianal and digital rectal examinations were normal.      A 3 mm polyp was found in the ascending colon. The polyp was sessile.       The polyp was removed with a cold snare. Resection and retrieval were       complete.      Two sessile polyps were found in the sigmoid colon. The polyps were 3 to       4 mm in size. These polyps were removed with a cold snare. Resection and       retrieval were complete. Impression:            - One 3 mm polyp in the ascending colon, removed with                         a cold snare. Resected and retrieved.                        - Two 3 to 4 mm polyps in the sigmoid colon, removed                         with a cold snare. Resected and retrieved. Recommendation:        - Discharge patient to home.                        - Resume previous diet.                        - Continue present medications.                        - Await pathology results.                        - If the pathology report reveals adenomatous tissue,                         then repeat the colonoscopy for surveillance  in 7                         years. Procedure Code(s):     --- Professional ---                        [id number], Colonoscopy, flexible; with removal of                         tumor(s), polyp(s), or other lesion(s) by snare                         technique Diagnosis Code(s):     --- Professional ---                        Z12.11, Encounter for screening for malignant neoplasm                         of colon                        D12.2, Benign neoplasm of ascending colon CPT copyright 2022  American Medical Association. All rights reserved. The codes documented in this report are preliminary and upon coder review may  be revised to meet current compliance requirements. [name] MD, MD 06/01/2023 9:06:37 AM This report has been signed electronically. Number of Addenda: 0 Note Initiated On: 06/01/2023 8:39 AM Scope Withdrawal Time: 0 hours 9 minutes 51 seconds  Total Procedure Duration: 0 hours 13 minutes 29 seconds  Estimated Blood Loss:  Estimated blood loss: none.      [facility]

## 2023-06-02 ENCOUNTER — Encounter: Payer: Self-pay | Admitting: Gastroenterology

## 2023-06-02 LAB — SURGICAL PATHOLOGY

## 2023-06-03 ENCOUNTER — Encounter: Payer: Self-pay | Admitting: Nurse Practitioner

## 2023-06-16 DIAGNOSIS — S63502A Unspecified sprain of left wrist, initial encounter: Secondary | ICD-10-CM | POA: Insufficient documentation

## 2023-06-16 DIAGNOSIS — S5002XA Contusion of left elbow, initial encounter: Secondary | ICD-10-CM | POA: Insufficient documentation

## 2023-06-16 HISTORY — DX: Unspecified sprain of left wrist, initial encounter: S63.502A

## 2023-06-16 HISTORY — DX: Contusion of left elbow, initial encounter: S50.02XA

## 2023-06-21 NOTE — Progress Notes (Unsigned)
Yvette Amy, PA-C [address]  [address]  [place], [place] [address]  Main: [phone]  Fax: [phone]   Primary Care Physician: Yvette Kuster, NP  Primary Gastroenterologist:  Yvette Amy, PA-C / Dr. Midge Humphrey    CC:  F/U Chronic Diarrhea  HPI: Yvette Humphrey is a 47 y.o. female returns for 4-month follow-up of diarrhea.  History of chronic diarrhea for many years.  Up to 10-12 episodes of diarrhea per day.  She has had previous cholecystectomy.  Was given trial of dicyclomine 10 Mg 3 times daily.  She also takes Lomotil as needed (prescribed by PCP).  She continues to have episodes of diarrhea and abdominal cramping.  Dicyclomine has helped a little.  She has good days and bad days.  Stool studies showed negative GI pathogen panel, C. difficile.  Normal fecal calprotectin and fecal elastase.  Negative celiac labs.  Colonoscopy 05/2023 by Dr. Servando Humphrey showed excellent prep, 3 small polyps removed.  Pathology showed benign colon mucosa and hyperplastic polyp.  No evidence of IBD.  Repeat screening colonoscopy in 10 years.  Current Outpatient Medications  Medication Sig Dispense Refill   ALPRAZolam (XANAX) 0.5 MG tablet TAKE 1 TABLET(0.5 MG) BY MOUTH DAILY AS NEEDED FOR ANXIETY 30 tablet 0   buPROPion (WELLBUTRIN) 75 MG tablet Take 1 tablet (75 mg total) by mouth 2 (two) times daily. 180 tablet 1   cholestyramine (QUESTRAN) 4 g packet Take 1 packet (4 g total) by mouth daily. 30 packet 5   cyanocobalamin 500 MCG tablet Take 500 mcg by mouth daily.     diclofenac (VOLTAREN) 75 MG EC tablet TAKE 1 TABLET(75 MG) BY MOUTH TWICE DAILY 60 tablet 2   dicyclomine (BENTYL) 10 MG capsule Take 1 capsule (10 mg total) by mouth 3 (three) times daily before meals. 90 capsule 2   diphenoxylate-atropine (LOMOTIL) 2.5-0.025 MG tablet TAKE 1 TABLET BY MOUTH FOUR TIMES DAILY AS NEEDED FOR DIARRHEA OR LOOSE STOOLS 30 tablet 0   fluconazole (DIFLUCAN) 150 MG tablet Take 1 tab po  once  repeat in 3 days If symptoms persists 3 tablet 1   fluticasone (FLONASE) 50 MCG/ACT nasal spray Place 1 spray into both nostrils daily. 16 g 2   gabapentin (NEURONTIN) 300 MG capsule TAKE 1 CAPSULE BY MOUTH IN THE MORNING AND EVENING AND 2 CAPS AT BEDTIME 120 capsule 2   metoprolol succinate (TOPROL-XL) 50 MG 24 hr tablet TAKE 1 TABLET BY MOUTH EVERY NIGHT AT BEDTIME FOR BLOOD PRESSURE 90 tablet 1   montelukast (SINGULAIR) 10 MG tablet TAKE 1 TABLET(10 MG) BY MOUTH AT BEDTIME 90 tablet 3   omeprazole (PRILOSEC) 20 MG capsule TAKE 1 CAPSULE(20 MG) BY MOUTH DAILY 30 capsule 3   ondansetron (ZOFRAN) 4 MG tablet TAKE 1 TABLET(4 MG) BY MOUTH EVERY 8 HOURS AS NEEDED FOR NAUSEA OR VOMITING 18 tablet 0   phentermine (ADIPEX-P) 37.5 MG tablet Take 1 tablet (37.5 mg total) by mouth daily before breakfast. 30 tablet 1   rosuvastatin (CRESTOR) 5 MG tablet Take 1 tablet (5 mg total) by mouth daily. 90 tablet 3   sertraline (ZOLOFT) 100 MG tablet TAKE 1 TABLET(100 MG) BY MOUTH DAILY 90 tablet 1   topiramate (TOPAMAX) 25 MG tablet Take 1 tablet (25 mg total) by mouth 2 (two) times daily. 60 tablet 3   traMADol (ULTRAM) 50 MG tablet Take 1 tablet (50 mg total) by mouth every 6 (six) hours as needed for severe pain. 60 tablet 1  Ubrogepant (UBRELVY) 100 MG TABS Take 1 tablet (100 mg total) by mouth daily as needed (acute migraine). 16 tablet 3   No current facility-administered medications for this visit.    Allergies as of 06/22/2023 - Review Complete 06/22/2023  Allergen Reaction Noted   Oxycodone Nausea And Vomiting 06/01/2016    Past Medical History:  Diagnosis Date   Abnormal posture 05/12/2023   Complication of anesthesia    Contusion of left elbow 06/16/2023   Headache    migraines, 1x/mo   Hyperlipidemia    Hypertension    Low back pain 07/05/2022   Low back strain 07/05/2022   Motion sickness    back seat cars   Neuropathy    Pain of left hip joint 05/12/2023   PONV (postoperative  nausea and vomiting)    Sacroiliac joint pain 07/05/2022   Sprain of left wrist 06/16/2023   Tachycardia    Wears contact lenses     Past Surgical History:  Procedure Laterality Date   CHOLECYSTECTOMY     COLONOSCOPY WITH PROPOFOL N/A 06/01/2023   Procedure: COLONOSCOPY WITH PROPOFOL;  Surgeon: Yvette Minium, MD;  Location: [facility];  Service: Endoscopy;  Laterality: N/A;   ENDOMETRIAL ABLATION     approx 2009   ESOPHAGOGASTRODUODENOSCOPY     POLYPECTOMY  06/01/2023   Procedure: POLYPECTOMY;  Surgeon: Yvette Minium, MD;  Location: [facility];  Service: Endoscopy;;   REDUCTION MAMMAPLASTY Bilateral 2004   TONSILLECTOMY     TUBAL LIGATION     ULNAR NERVE TRANSPOSITION Left 06/08/2016   Procedure: SUBCUTANEOUS ANTERIOR ULNAR NERVE TRANSPOSITION LEFT ELBOW;  Surgeon: Christena Flake, MD;  Location: [facility];  Service: Orthopedics;  Laterality: Left;    Review of Systems:    All systems reviewed and negative except where noted in HPI.   Physical Examination:   BP 107/66   Pulse 89   Temp 97.8 F (36.6 C)   Ht 5\' 3"  (1.6 m)   Wt 179 lb 9.6 oz (81.5 kg)   BMI 31.81 kg/m   General: Well-nourished, well-developed in no acute distress.  Lungs: Clear to auscultation bilaterally. Non-labored. Heart: Regular rate and rhythm, no murmurs rubs or gallops.  Abdomen: Bowel sounds are normal; Abdomen is Soft; No hepatosplenomegaly, masses or hernias;  No Abdominal Tenderness; No guarding or rebound tenderness. Neuro: Alert and oriented x 3.  Grossly intact.  Psych: Alert and cooperative, normal mood and affect.   Imaging Studies: No results found.  Assessment and Plan:   Yvette Humphrey is a 47 y.o. y/o female returns for follow-up of chronic diarrhea attributed to irritable bowel syndrome versus bile salt diarrhea postcholecystectomy.  Extensive GI workup unrevealing.  Stool studies negative.  Celiac labs negative.  1.  Bile salt diarrhea postcholecystectomy 2.   Irritable bowel syndrome, diarrhea predominant  Plan: -Start Rx Cholestyramine powder, Mix 1 packet in a drink once daily. -Continue dicyclomine 10 Mg 3 times daily as needed -Low FODMAP diet handout given. -Continue Lomotil as needed - OK for PCP to Refill.  Yvette Amy, PA-C  Follow up As Needed.

## 2023-06-22 ENCOUNTER — Encounter: Payer: Self-pay | Admitting: Physician Assistant

## 2023-06-22 ENCOUNTER — Ambulatory Visit: Payer: BC Managed Care – PPO | Admitting: Physician Assistant

## 2023-06-22 VITALS — BP 107/66 | HR 89 | Temp 97.8°F | Ht 63.0 in | Wt 179.6 lb

## 2023-06-22 DIAGNOSIS — K529 Noninfective gastroenteritis and colitis, unspecified: Secondary | ICD-10-CM

## 2023-06-22 DIAGNOSIS — K9089 Other intestinal malabsorption: Secondary | ICD-10-CM

## 2023-06-22 DIAGNOSIS — K9189 Other postprocedural complications and disorders of digestive system: Secondary | ICD-10-CM | POA: Diagnosis not present

## 2023-06-22 DIAGNOSIS — K580 Irritable bowel syndrome with diarrhea: Secondary | ICD-10-CM | POA: Diagnosis not present

## 2023-06-22 MED ORDER — CHOLESTYRAMINE 4 G PO PACK: 4.0000 g | PACK | Freq: Every day | ORAL | 5 refills | Status: DC

## 2023-06-26 ENCOUNTER — Ambulatory Visit (INDEPENDENT_AMBULATORY_CARE_PROVIDER_SITE_OTHER): Payer: BC Managed Care – PPO | Admitting: Nurse Practitioner

## 2023-06-26 ENCOUNTER — Encounter: Payer: Self-pay | Admitting: Nurse Practitioner

## 2023-06-26 VITALS — BP 110/80 | HR 83 | Temp 98.4°F | Resp 16 | Ht 63.0 in | Wt 180.2 lb

## 2023-06-26 DIAGNOSIS — K580 Irritable bowel syndrome with diarrhea: Secondary | ICD-10-CM

## 2023-06-26 DIAGNOSIS — J0110 Acute frontal sinusitis, unspecified: Secondary | ICD-10-CM

## 2023-06-26 DIAGNOSIS — Z0001 Encounter for general adult medical examination with abnormal findings: Secondary | ICD-10-CM | POA: Diagnosis not present

## 2023-06-26 DIAGNOSIS — M25532 Pain in left wrist: Secondary | ICD-10-CM

## 2023-06-26 DIAGNOSIS — M25552 Pain in left hip: Secondary | ICD-10-CM

## 2023-06-26 DIAGNOSIS — T3695XA Adverse effect of unspecified systemic antibiotic, initial encounter: Secondary | ICD-10-CM

## 2023-06-26 DIAGNOSIS — B379 Candidiasis, unspecified: Secondary | ICD-10-CM

## 2023-06-26 DIAGNOSIS — G8929 Other chronic pain: Secondary | ICD-10-CM

## 2023-06-26 DIAGNOSIS — Z79899 Other long term (current) drug therapy: Secondary | ICD-10-CM

## 2023-06-26 MED ORDER — SERTRALINE HCL 100 MG PO TABS: ORAL_TABLET | ORAL | 1 refills | Status: DC

## 2023-06-26 MED ORDER — GABAPENTIN 300 MG PO CAPS: ORAL_CAPSULE | ORAL | 2 refills | Status: DC

## 2023-06-26 MED ORDER — OMEPRAZOLE 20 MG PO CPDR: 20.0000 mg | DELAYED_RELEASE_CAPSULE | Freq: Every day | ORAL | 3 refills | Status: DC

## 2023-06-26 MED ORDER — BUPROPION HCL 75 MG PO TABS: 75.0000 mg | ORAL_TABLET | Freq: Two times a day (BID) | ORAL | 1 refills | Status: DC

## 2023-06-26 MED ORDER — HYDROCOD POLI-CHLORPHE POLI ER 10-8 MG/5ML PO SUER: 5.0000 mL | Freq: Two times a day (BID) | ORAL | 0 refills | Status: DC | PRN

## 2023-06-26 MED ORDER — TRAMADOL HCL 50 MG PO TABS: 50.0000 mg | ORAL_TABLET | Freq: Four times a day (QID) | ORAL | 1 refills | Status: DC | PRN

## 2023-06-26 MED ORDER — FLUCONAZOLE 150 MG PO TABS: ORAL_TABLET | ORAL | 1 refills | Status: AC

## 2023-06-26 MED ORDER — METOPROLOL SUCCINATE ER 50 MG PO TB24: ORAL_TABLET | ORAL | 1 refills | Status: DC

## 2023-06-26 MED ORDER — DIPHENOXYLATE-ATROPINE 2.5-0.025 MG PO TABS: 1.0000 | ORAL_TABLET | Freq: Four times a day (QID) | ORAL | 1 refills | Status: AC

## 2023-06-26 MED ORDER — AMOXICILLIN-POT CLAVULANATE 875-125 MG PO TABS: 1.0000 | ORAL_TABLET | Freq: Two times a day (BID) | ORAL | 0 refills | Status: AC

## 2023-06-26 MED ORDER — ALPRAZOLAM 0.5 MG PO TABS: ORAL_TABLET | ORAL | 2 refills | Status: DC

## 2023-06-26 NOTE — Progress Notes (Signed)
[facility] [address] [place], [place] [address]  Internal MEDICINE  Office Visit Note  Patient Name: Yvette Humphrey  [id number]  [id number]  Date of Service: 06/26/2023  Chief Complaint  Patient presents with   Hyperlipidemia   Hypertension   Annual Exam    HPI April presents for an annual well visit and physical exam.  Well-appearing 47 y.o. female with hypertension, migraines, inflammatory polyarthropathy, GAD, insomnia, depression, and IBS-D Routine CRC screening: due in 2034 Routine mammogram: October 2025 Pap smear: October 2025 Labs: due for cholesterol panel.  New or worsening pain: none  Other concerns: due for refills and some labs Has IBS-D and was started on cholestyramine by GI    Current Medication: Outpatient Encounter Medications as of 06/26/2023  Medication Sig   amoxicillin-clavulanate (AUGMENTIN) 875-125 MG tablet Take 1 tablet by mouth 2 (two) times daily for 10 days. Take with food   celecoxib (CELEBREX) 200 MG capsule Take 200 mg by mouth daily.   chlorpheniramine-HYDROcodone (TUSSIONEX) 10-8 MG/5ML Take 5 mLs by mouth every 12 (twelve) hours as needed for cough.   cholestyramine (QUESTRAN) 4 g packet Take 1 packet (4 g total) by mouth daily.   cyanocobalamin 500 MCG tablet Take 500 mcg by mouth daily.   dicyclomine (BENTYL) 10 MG capsule Take 1 capsule (10 mg total) by mouth 3 (three) times daily before meals.   fluticasone (FLONASE) 50 MCG/ACT nasal spray Place 1 spray into both nostrils daily.   montelukast (SINGULAIR) 10 MG tablet TAKE 1 TABLET(10 MG) BY MOUTH AT BEDTIME   ondansetron (ZOFRAN) 4 MG tablet TAKE 1 TABLET(4 MG) BY MOUTH EVERY 8 HOURS AS NEEDED FOR NAUSEA OR VOMITING   rosuvastatin (CRESTOR) 5 MG tablet Take 1 tablet (5 mg total) by mouth daily.   topiramate (TOPAMAX) 25 MG tablet Take 1 tablet (25 mg total) by mouth 2 (two) times daily.   Ubrogepant (UBRELVY) 100 MG TABS Take 1 tablet (100 mg total) by mouth daily as  needed (acute migraine).   [DISCONTINUED] ALPRAZolam (XANAX) 0.5 MG tablet TAKE 1 TABLET(0.5 MG) BY MOUTH DAILY AS NEEDED FOR ANXIETY   [DISCONTINUED] buPROPion (WELLBUTRIN) 75 MG tablet Take 1 tablet (75 mg total) by mouth 2 (two) times daily.   [DISCONTINUED] diclofenac (VOLTAREN) 75 MG EC tablet TAKE 1 TABLET(75 MG) BY MOUTH TWICE DAILY   [DISCONTINUED] diphenoxylate-atropine (LOMOTIL) 2.5-0.025 MG tablet TAKE 1 TABLET BY MOUTH FOUR TIMES DAILY AS NEEDED FOR DIARRHEA OR LOOSE STOOLS   [DISCONTINUED] fluconazole (DIFLUCAN) 150 MG tablet Take 1 tab po  once repeat in 3 days If symptoms persists   [DISCONTINUED] gabapentin (NEURONTIN) 300 MG capsule TAKE 1 CAPSULE BY MOUTH IN THE MORNING AND EVENING AND 2 CAPS AT BEDTIME   [DISCONTINUED] metoprolol succinate (TOPROL-XL) 50 MG 24 hr tablet TAKE 1 TABLET BY MOUTH EVERY NIGHT AT BEDTIME FOR BLOOD PRESSURE   [DISCONTINUED] omeprazole (PRILOSEC) 20 MG capsule TAKE 1 CAPSULE(20 MG) BY MOUTH DAILY   [DISCONTINUED] phentermine (ADIPEX-P) 37.5 MG tablet Take 1 tablet (37.5 mg total) by mouth daily before breakfast.   [DISCONTINUED] sertraline (ZOLOFT) 100 MG tablet TAKE 1 TABLET(100 MG) BY MOUTH DAILY   [DISCONTINUED] traMADol (ULTRAM) 50 MG tablet Take 1 tablet (50 mg total) by mouth every 6 (six) hours as needed for severe pain.   ALPRAZolam (XANAX) 0.5 MG tablet TAKE 1 TABLET(0.5 MG) BY MOUTH DAILY AS NEEDED FOR ANXIETY   buPROPion (WELLBUTRIN) 75 MG tablet Take 1 tablet (75 mg total) by mouth 2 (  two) times daily.   diphenoxylate-atropine (LOMOTIL) 2.5-0.025 MG tablet Take 1 tablet by mouth 4 (four) times daily.   fluconazole (DIFLUCAN) 150 MG tablet Take 1 tab po  once repeat in 3 days If symptoms persists   gabapentin (NEURONTIN) 300 MG capsule TAKE 1 CAPSULE BY MOUTH IN THE MORNING AND EVENING AND 2 CAPS AT BEDTIME   metoprolol succinate (TOPROL-XL) 50 MG 24 hr tablet TAKE 1 TABLET BY MOUTH EVERY NIGHT AT BEDTIME FOR BLOOD PRESSURE   omeprazole  (PRILOSEC) 20 MG capsule Take 1 capsule (20 mg total) by mouth daily.   sertraline (ZOLOFT) 100 MG tablet TAKE 1 TABLET(100 MG) BY MOUTH DAILY   traMADol (ULTRAM) 50 MG tablet Take 1 tablet (50 mg total) by mouth every 6 (six) hours as needed for severe pain (pain score 7-10).   No facility-administered encounter medications on file as of 06/26/2023.    Surgical History: Past Surgical History:  Procedure Laterality Date   CHOLECYSTECTOMY     COLONOSCOPY WITH PROPOFOL N/A 06/01/2023   Procedure: COLONOSCOPY WITH PROPOFOL;  Surgeon: [name], MD;  Location: [facility] ENDOSCOPY;  Service: Endoscopy;  Laterality: N/A;   ENDOMETRIAL ABLATION     approx 2009   ESOPHAGOGASTRODUODENOSCOPY     POLYPECTOMY  06/01/2023   Procedure: POLYPECTOMY;  Surgeon: [name], MD;  Location: [facility] ENDOSCOPY;  Service: Endoscopy;;   REDUCTION MAMMAPLASTY Bilateral 2004   TONSILLECTOMY     TUBAL LIGATION     ULNAR NERVE TRANSPOSITION Left 06/08/2016   Procedure: SUBCUTANEOUS ANTERIOR ULNAR NERVE TRANSPOSITION LEFT ELBOW;  Surgeon: Christena Flake, MD;  Location[facility];  Service: Orthopedics;  Laterality: Left;    Medical History: Past Medical History:  Diagnosis Date   Abnormal posture 05/12/2023   Complication of anesthesia    Contusion of left elbow 06/16/2023   Headache    migraines, 1x/mo   Hyperlipidemia    Hypertension    Low back pain 07/05/2022   Low back strain 07/05/2022   Motion sickness    back seat cars   Neuropathy    Pain of left hip joint 05/12/2023   PONV (postoperative nausea and vomiting)    Sacroiliac joint pain 07/05/2022   Sprain of left wrist 06/16/2023   Tachycardia    Wears contact lenses     Family History: Family History  Problem Relation Age of Onset   Hypertension Mother    Hyperlipidemia Mother    Arrhythmia Mother    Heart attack Maternal Grandfather    Hyperlipidemia Maternal Grandfather    COPD Maternal Grandfather    Breast cancer Paternal  Grandmother     Social History   Socioeconomic History   Marital status: Married    Spouse name: Not on file   Number of children: Not on file   Years of education: Not on file   Highest education level: Not on file  Occupational History   Not on file  Tobacco Use   Smoking status: Never   Smokeless tobacco: Never  Vaping Use   Vaping status: Never Used  Substance and Sexual Activity   Alcohol use: Yes    Alcohol/week: 1.0 standard drink of alcohol    Types: 1 Glasses of wine per week    Comment: occasionally   Drug use: No   Sexual activity: Not on file  Other Topics Concern   Not on file  Social History Narrative   Not on file   Social Determinants of Health   Financial Resource Strain:  Not on file  Food Insecurity: Not on file  Transportation Needs: Not on file  Physical Activity: Not on file  Stress: Not on file  Social Connections: Not on file  Intimate Partner Violence: Not on file      Review of Systems  Constitutional:  Positive for fatigue. Negative for appetite change, chills and fever.  HENT:  Positive for congestion, postnasal drip, rhinorrhea, sinus pressure, sinus pain, sneezing, sore throat and voice change.   Respiratory:  Positive for cough. Negative for chest tightness, shortness of breath and wheezing.   Cardiovascular: Negative.  Negative for chest pain and palpitations.  Gastrointestinal:  Positive for diarrhea. Negative for nausea and vomiting.  Neurological:  Positive for headaches.    Vital Signs: BP 110/80   Pulse 83   Temp 98.4 F (36.9 C)   Resp 16   Ht 5\' 3"  (1.6 m)   Wt 180 lb 3.2 oz (81.7 kg)   SpO2 96%   BMI 31.92 kg/m    Physical Exam Vitals reviewed.  Constitutional:      General: She is awake. She is not in acute distress.    Appearance: Normal appearance. She is well-developed and well-groomed. She is obese. She is ill-appearing. She is not diaphoretic.  HENT:     Head: Normocephalic and atraumatic.     Right  Ear: Tympanic membrane, ear canal and external ear normal.     Left Ear: Tympanic membrane, ear canal and external ear normal.     Nose: Mucosal edema, congestion and rhinorrhea present.     Right Turbinates: Swollen and pale.     Left Turbinates: Swollen and pale.     Right Sinus: Maxillary sinus tenderness and frontal sinus tenderness present.     Left Sinus: Maxillary sinus tenderness and frontal sinus tenderness present.     Mouth/Throat:     Lips: Pink.     Mouth: Mucous membranes are moist.     Pharynx: Uvula midline. Posterior oropharyngeal erythema present. No oropharyngeal exudate.  Eyes:     General: Lids are normal. Vision grossly intact. Gaze aligned appropriately. No scleral icterus.       Right eye: No discharge.        Left eye: No discharge.     Extraocular Movements: Extraocular movements intact.     Conjunctiva/sclera: Conjunctivae normal.     Pupils: Pupils are equal, round, and reactive to light.     Funduscopic exam:    Right eye: Red reflex present.        Left eye: Red reflex present. Neck:     Thyroid: No thyromegaly.     Vascular: No carotid bruit or JVD.     Trachea: Trachea and phonation normal. No tracheal deviation.  Cardiovascular:     Rate and Rhythm: Normal rate and regular rhythm.     Pulses: Normal pulses.     Heart sounds: Normal heart sounds, S1 normal and S2 normal. No murmur heard.    No friction rub. No gallop.  Pulmonary:     Effort: Pulmonary effort is normal. No accessory muscle usage or respiratory distress.     Breath sounds: Normal breath sounds and air entry. No stridor. No wheezing or rales.  Chest:     Chest wall: No tenderness.     Comments: Declined clinical breast exam, gets annual mammograms.  Abdominal:     General: Bowel sounds are normal. There is no distension.     Palpations: Abdomen is soft. There is no  shifting dullness, fluid wave, mass or pulsatile mass.     Tenderness: There is no abdominal tenderness. There is no  guarding or rebound.  Musculoskeletal:        General: No tenderness or deformity. Normal range of motion.     Cervical back: Normal range of motion and neck supple.  Lymphadenopathy:     Cervical: No cervical adenopathy.  Skin:    General: Skin is warm and dry.     Capillary Refill: Capillary refill takes less than 2 seconds.     Coloration: Skin is not pale.     Findings: No erythema or rash.  Neurological:     Mental Status: She is alert and oriented to person, place, and time.     Cranial Nerves: No cranial nerve deficit.     Motor: No abnormal muscle tone.     Coordination: Coordination normal.     Gait: Gait normal.     Deep Tendon Reflexes: Reflexes are normal and symmetric.  Psychiatric:        Mood and Affect: Mood and affect normal.        Behavior: Behavior normal. Behavior is cooperative.        Thought Content: Thought content normal.        Judgment: Judgment normal.        Assessment/Plan: 1. Encounter for routine adult health examination with abnormal findings Age-appropriate preventive screenings and vaccinations discussed, annual physical exam completed. Routine labs for health maintenance will be ordered. Marland Kitchen PHM updated.   2. Acute non-recurrent frontal sinusitis Antibiotic and cough medication prescribed, take as ordered. Finished antibiotic until gone  - amoxicillin-clavulanate (AUGMENTIN) 875-125 MG tablet; Take 1 tablet by mouth 2 (two) times daily for 10 days. Take with food  Dispense: 20 tablet; Refill: 0 - chlorpheniramine-HYDROcodone (TUSSIONEX) 10-8 MG/5ML; Take 5 mLs by mouth every 12 (twelve) hours as needed for cough.  Dispense: 140 mL; Refill: 0  3. Antibiotic-induced yeast infection Fluconazole prescribed  - fluconazole (DIFLUCAN) 150 MG tablet; Take 1 tab po  once repeat in 3 days If symptoms persists  Dispense: 3 tablet; Refill: 1  4. Irritable bowel syndrome with diarrhea Continue prn lomotil as prescribed  - diphenoxylate-atropine  (LOMOTIL) 2.5-0.025 MG tablet; Take 1 tablet by mouth 4 (four) times daily.  Dispense: 30 tablet; Refill: 1  5. Chronic pain of left wrist Continue gabapentin as prescribed.  - gabapentin (NEURONTIN) 300 MG capsule; TAKE 1 CAPSULE BY MOUTH IN THE MORNING AND EVENING AND 2 CAPS AT BEDTIME  Dispense: 120 capsule; Refill: 2  6. Chronic left hip pain Continue celebrex as prescribed  - celecoxib (CELEBREX) 200 MG capsule; Take 200 mg by mouth daily.  7. Encounter for medication review Medication list review, updated and refills ordered  - ALPRAZolam (XANAX) 0.5 MG tablet; TAKE 1 TABLET(0.5 MG) BY MOUTH DAILY AS NEEDED FOR ANXIETY  Dispense: 30 tablet; Refill: 2 - buPROPion (WELLBUTRIN) 75 MG tablet; Take 1 tablet (75 mg total) by mouth 2 (two) times daily.  Dispense: 180 tablet; Refill: 1 - metoprolol succinate (TOPROL-XL) 50 MG 24 hr tablet; TAKE 1 TABLET BY MOUTH EVERY NIGHT AT BEDTIME FOR BLOOD PRESSURE  Dispense: 90 tablet; Refill: 1 - omeprazole (PRILOSEC) 20 MG capsule; Take 1 capsule (20 mg total) by mouth daily.  Dispense: 90 capsule; Refill: 3 - sertraline (ZOLOFT) 100 MG tablet; TAKE 1 TABLET(100 MG) BY MOUTH DAILY  Dispense: 90 tablet; Refill: 1 - traMADol (ULTRAM) 50 MG tablet; Take 1 tablet (50  mg total) by mouth every 6 (six) hours as needed for severe pain (pain score 7-10).  Dispense: 60 tablet; Refill: 1      General Counseling: dayelin fagley understanding of the findings of todays visit and agrees with plan of treatment. I have discussed any further diagnostic evaluation that may be needed or ordered today. We also reviewed her medications today. she has been encouraged to call the office with any questions or concerns that should arise related to todays visit.    No orders of the defined types were placed in this encounter.   Meds ordered this encounter  Medications   ALPRAZolam (XANAX) 0.5 MG tablet    Sig: TAKE 1 TABLET(0.5 MG) BY MOUTH DAILY AS NEEDED FOR ANXIETY     Dispense:  30 tablet    Refill:  2   buPROPion (WELLBUTRIN) 75 MG tablet    Sig: Take 1 tablet (75 mg total) by mouth 2 (two) times daily.    Dispense:  180 tablet    Refill:  1   diphenoxylate-atropine (LOMOTIL) 2.5-0.025 MG tablet    Sig: Take 1 tablet by mouth 4 (four) times daily.    Dispense:  30 tablet    Refill:  1   fluconazole (DIFLUCAN) 150 MG tablet    Sig: Take 1 tab po  once repeat in 3 days If symptoms persists    Dispense:  3 tablet    Refill:  1   gabapentin (NEURONTIN) 300 MG capsule    Sig: TAKE 1 CAPSULE BY MOUTH IN THE MORNING AND EVENING AND 2 CAPS AT BEDTIME    Dispense:  120 capsule    Refill:  2   metoprolol succinate (TOPROL-XL) 50 MG 24 hr tablet    Sig: TAKE 1 TABLET BY MOUTH EVERY NIGHT AT BEDTIME FOR BLOOD PRESSURE    Dispense:  90 tablet    Refill:  1    For future refills   omeprazole (PRILOSEC) 20 MG capsule    Sig: Take 1 capsule (20 mg total) by mouth daily.    Dispense:  90 capsule    Refill:  3   sertraline (ZOLOFT) 100 MG tablet    Sig: TAKE 1 TABLET(100 MG) BY MOUTH DAILY    Dispense:  90 tablet    Refill:  1   traMADol (ULTRAM) 50 MG tablet    Sig: Take 1 tablet (50 mg total) by mouth every 6 (six) hours as needed for severe pain (pain score 7-10).    Dispense:  60 tablet    Refill:  1   amoxicillin-clavulanate (AUGMENTIN) 875-125 MG tablet    Sig: Take 1 tablet by mouth 2 (two) times daily for 10 days. Take with food    Dispense:  20 tablet    Refill:  0   chlorpheniramine-HYDROcodone (TUSSIONEX) 10-8 MG/5ML    Sig: Take 5 mLs by mouth every 12 (twelve) hours as needed for cough.    Dispense:  140 mL    Refill:  0    Return in about 3 months (around 09/19/2023) for F/U, anxiety med refill, Bennye Nix PCP and weight loss .   Total time spent:30 Minutes Time spent includes review of chart, medications, test results, and follow up plan with the patient.   [place] Controlled Substance Database was reviewed by me.  This patient was  seen by Sallyanne Kuster, FNP-C in collaboration with Dr. Beverely Risen as a part of collaborative care agreement.  Jahzeel Poythress R. Tedd Sias, MSN, FNP-C Internal medicine

## 2023-07-12 ENCOUNTER — Other Ambulatory Visit: Payer: Self-pay | Admitting: Internal Medicine

## 2023-07-12 DIAGNOSIS — R112 Nausea with vomiting, unspecified: Secondary | ICD-10-CM

## 2023-07-14 ENCOUNTER — Other Ambulatory Visit: Payer: Self-pay | Admitting: Nurse Practitioner

## 2023-07-14 DIAGNOSIS — E781 Pure hyperglyceridemia: Secondary | ICD-10-CM

## 2023-07-14 DIAGNOSIS — E559 Vitamin D deficiency, unspecified: Secondary | ICD-10-CM

## 2023-07-14 DIAGNOSIS — I479 Paroxysmal tachycardia, unspecified: Secondary | ICD-10-CM

## 2023-07-14 DIAGNOSIS — K580 Irritable bowel syndrome with diarrhea: Secondary | ICD-10-CM

## 2023-07-14 DIAGNOSIS — E782 Mixed hyperlipidemia: Secondary | ICD-10-CM

## 2023-07-14 DIAGNOSIS — E538 Deficiency of other specified B group vitamins: Secondary | ICD-10-CM

## 2023-07-19 ENCOUNTER — Encounter: Payer: Self-pay | Admitting: Nurse Practitioner

## 2023-07-19 LAB — CBC WITH DIFFERENTIAL/PLATELET
Basophils Absolute: 0 10*3/uL (ref 0.0–0.2)
Basos: 1 %
EOS (ABSOLUTE): 0.1 10*3/uL (ref 0.0–0.4)
Eos: 2 %
Hematocrit: 41.4 % (ref 34.0–46.6)
Hemoglobin: 13.9 g/dL (ref 11.1–15.9)
Immature Grans (Abs): 0.1 10*3/uL (ref 0.0–0.1)
Immature Granulocytes: 1 %
Lymphocytes Absolute: 2.2 10*3/uL (ref 0.7–3.1)
Lymphs: 31 %
MCH: 31.4 pg (ref 26.6–33.0)
MCHC: 33.6 g/dL (ref 31.5–35.7)
MCV: 94 fL (ref 79–97)
Monocytes Absolute: 0.4 10*3/uL (ref 0.1–0.9)
Monocytes: 5 %
Neutrophils Absolute: 4.3 10*3/uL (ref 1.4–7.0)
Neutrophils: 60 %
Platelets: 261 10*3/uL (ref 150–450)
RBC: 4.42 x10E6/uL (ref 3.77–5.28)
RDW: 11.7 % (ref 11.7–15.4)
WBC: 7.2 10*3/uL (ref 3.4–10.8)

## 2023-07-19 LAB — CMP14+EGFR
ALT: 21 [IU]/L (ref 0–32)
AST: 21 [IU]/L (ref 0–40)
Albumin: 4.9 g/dL (ref 3.9–4.9)
Alkaline Phosphatase: 67 [IU]/L (ref 44–121)
BUN/Creatinine Ratio: 13 (ref 9–23)
BUN: 13 mg/dL (ref 6–24)
Bilirubin Total: 0.3 mg/dL (ref 0.0–1.2)
CO2: 21 mmol/L (ref 20–29)
Calcium: 10 mg/dL (ref 8.7–10.2)
Chloride: 101 mmol/L (ref 96–106)
Creatinine, Ser: 0.97 mg/dL (ref 0.57–1.00)
Globulin, Total: 2.6 g/dL (ref 1.5–4.5)
Glucose: 99 mg/dL (ref 70–99)
Potassium: 4.7 mmol/L (ref 3.5–5.2)
Sodium: 138 mmol/L (ref 134–144)
Total Protein: 7.5 g/dL (ref 6.0–8.5)
eGFR: 72 mL/min/{1.73_m2} (ref >59)

## 2023-07-19 LAB — LIPID PANEL
Chol/HDL Ratio: 3.6 {ratio} (ref 0.0–4.4)
Cholesterol, Total: 184 mg/dL (ref 100–199)
HDL: 51 mg/dL (ref >39)
LDL Chol Calc (NIH): 89 mg/dL (ref 0–99)
Triglycerides: 268 mg/dL — ABNORMAL HIGH (ref 0–149)
VLDL Cholesterol Cal: 44 mg/dL — ABNORMAL HIGH (ref 5–40)

## 2023-07-19 LAB — IRON,TIBC AND FERRITIN PANEL
Ferritin: 309 ng/mL — ABNORMAL HIGH (ref 15–150)
Iron Saturation: 24 % (ref 15–55)
Iron: 74 ug/dL (ref 27–159)
Total Iron Binding Capacity: 306 ug/dL (ref 250–450)
UIBC: 232 ug/dL (ref 131–425)

## 2023-07-19 LAB — VITAMIN D 25 HYDROXY (VIT D DEFICIENCY, FRACTURES): Vit D, 25-Hydroxy: 46 ng/mL (ref 30.0–100.0)

## 2023-07-19 LAB — B12 AND FOLATE PANEL
Folate: >20 ng/mL (ref >3.0)
Vitamin B-12: 400 pg/mL (ref 232–1245)

## 2023-07-19 LAB — TSH+FREE T4
Free T4: 1.22 ng/dL (ref 0.82–1.77)
TSH: 1.63 u[IU]/mL (ref 0.450–4.500)

## 2023-08-07 ENCOUNTER — Other Ambulatory Visit: Payer: Self-pay | Admitting: Nurse Practitioner

## 2023-08-07 DIAGNOSIS — Z760 Encounter for issue of repeat prescription: Secondary | ICD-10-CM

## 2023-08-08 ENCOUNTER — Encounter: Payer: Self-pay | Admitting: Nurse Practitioner

## 2023-08-08 MED ORDER — TRAZODONE HCL 100 MG PO TABS: 100.0000 mg | ORAL_TABLET | Freq: Every day | ORAL | 1 refills | Status: DC

## 2023-08-29 IMAGING — MG MM DIGITAL SCREENING BILAT W/ TOMO AND CAD
8 series · 8 of 24 positions shown · non-contrast
Comparison: Previous exam(s).

CLINICAL DATA: Screening.

EXAM:
DIGITAL SCREENING BILATERAL MAMMOGRAM WITH TOMOSYNTHESIS AND CAD
TECHNIQUE: Bilateral screening digital craniocaudal and mediolateral oblique
mammograms were obtained. Bilateral screening digital breast
tomosynthesis was performed. The images were evaluated with
computer-aided detection.

[R CC synth-2D]
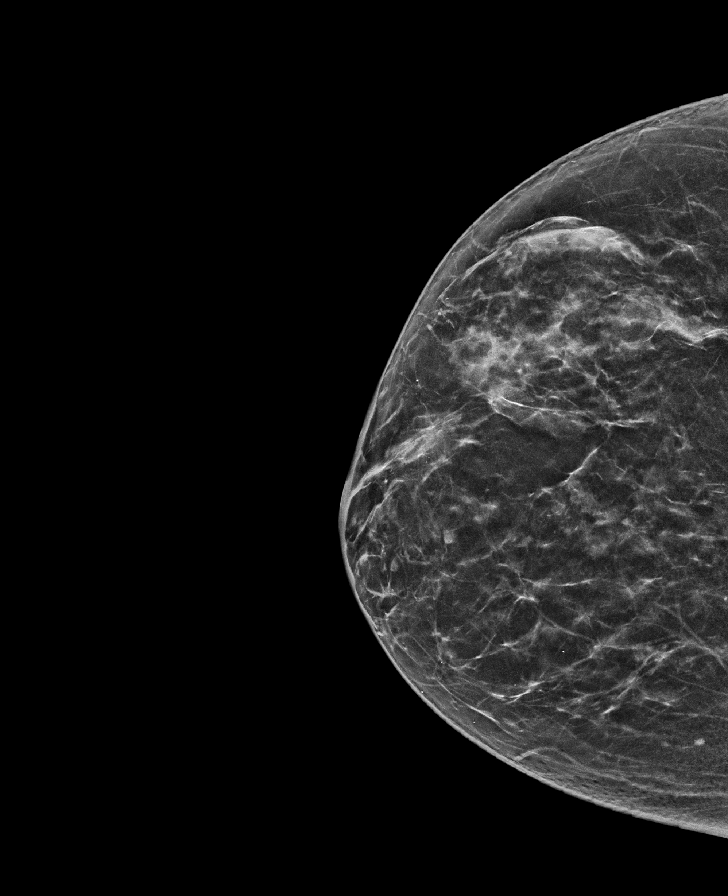

[L CC synth-2D]
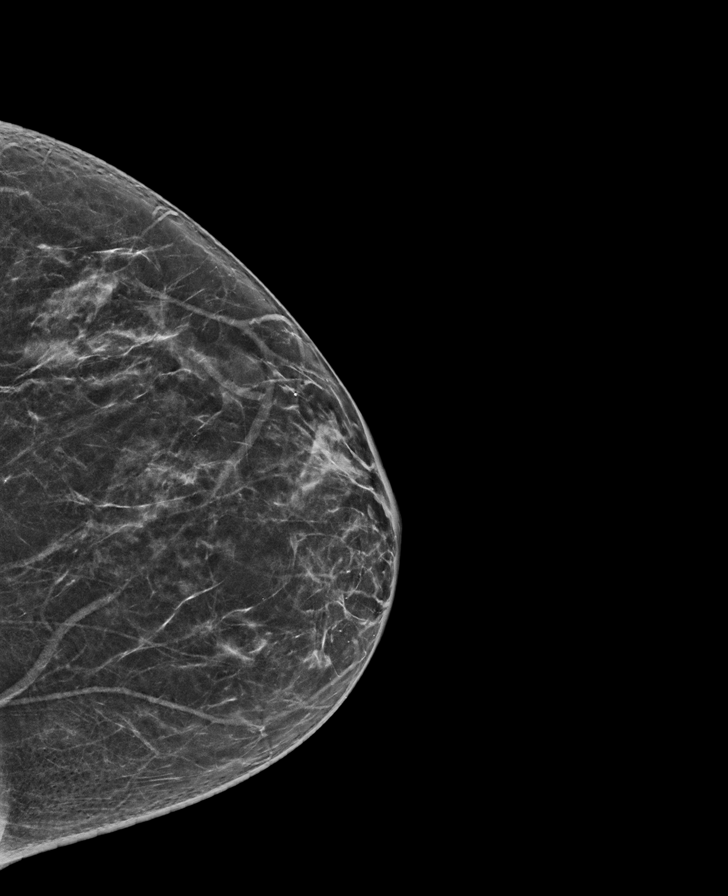

[R MLO synth-2D]
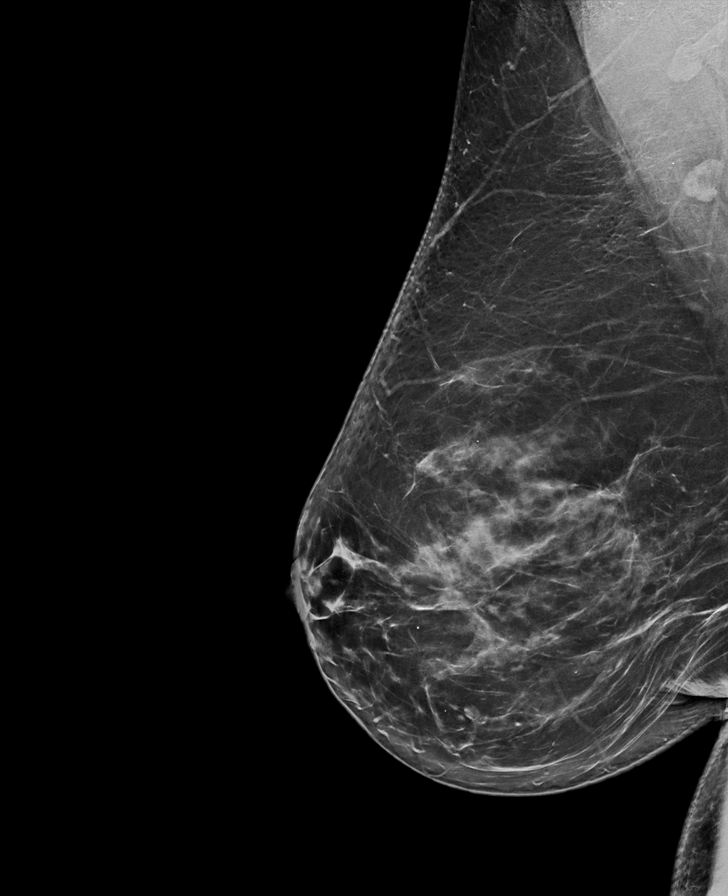

[L MLO synth-2D]
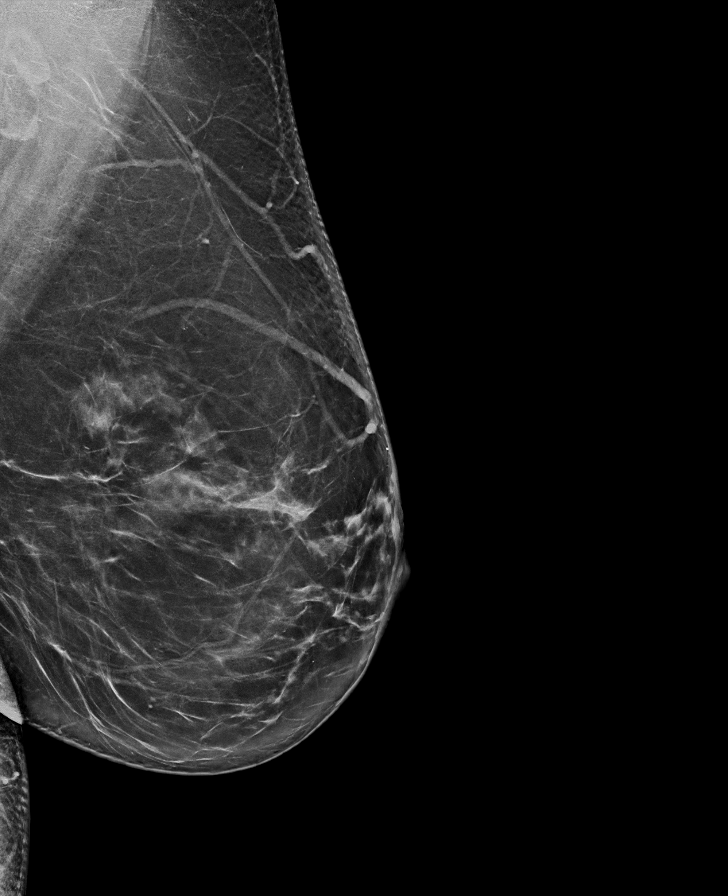

[R CC tomo · tomo slice 35/70.0]
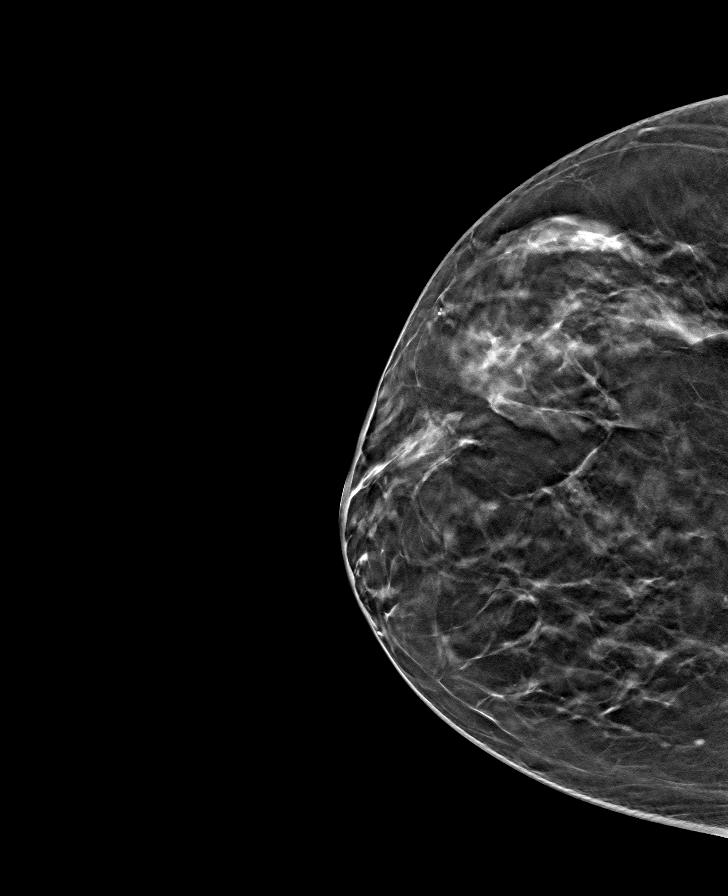

[L MLO tomo · tomo slice 47/92.0]
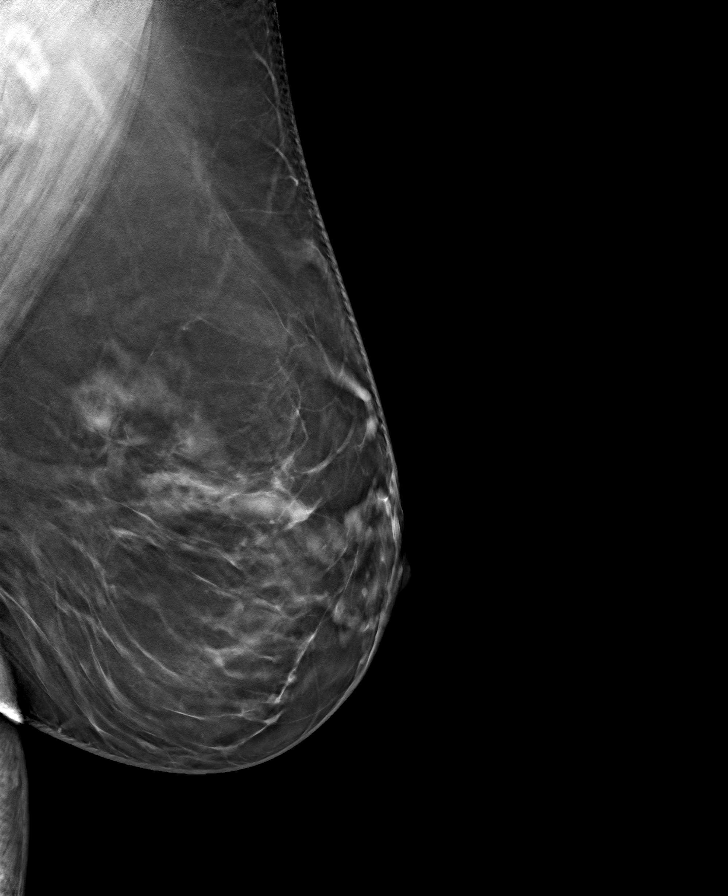

[R MLO tomo · tomo slice 47/94.0]
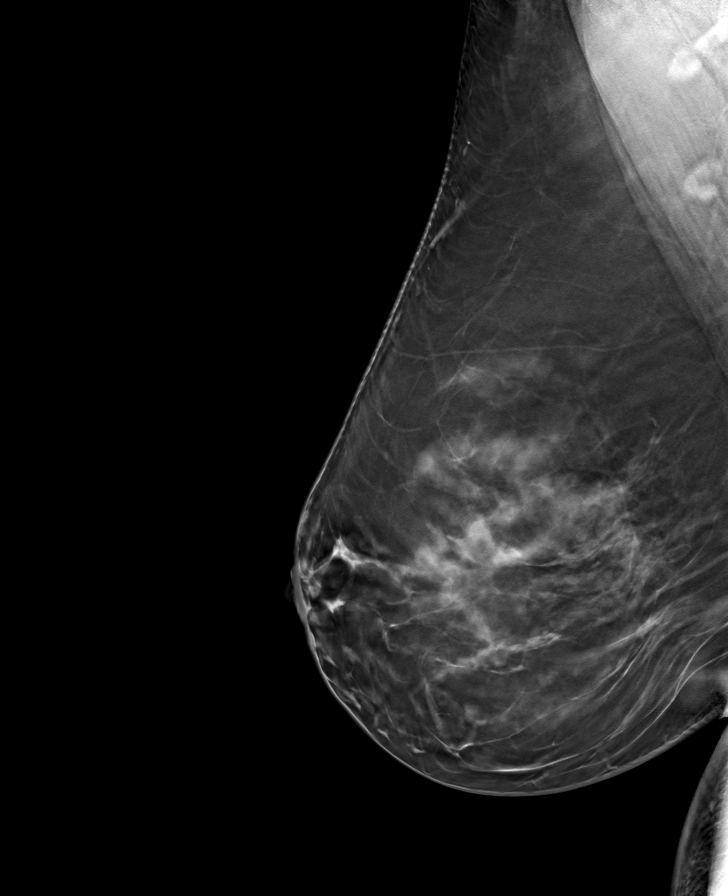

[L CC tomo · tomo slice 38/75.0]
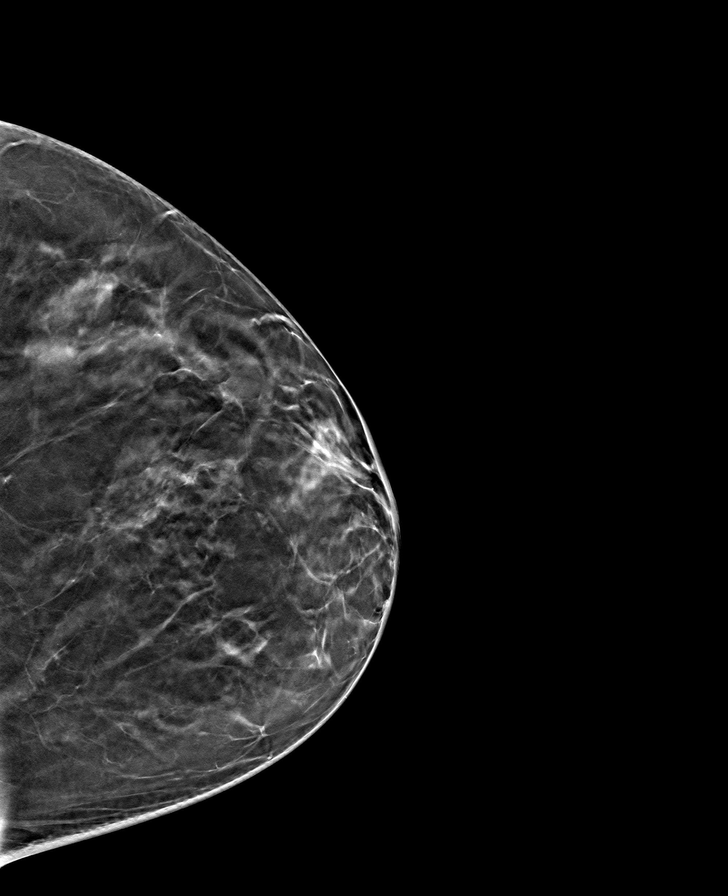

[8 of 24 positions shown; findings below may reference images not displayed]

ACR Breast Density Category b: There are scattered areas of
fibroglandular density.
FINDINGS: There are no findings suspicious for malignancy.
IMPRESSION: No mammographic evidence of malignancy. A result letter of this
screening mammogram will be mailed directly to the patient.

RECOMMENDATION:
Screening mammogram in one year. (Code:51-O-LD2)

BI-RADS CATEGORY  1: Negative.

## 2023-09-18 ENCOUNTER — Other Ambulatory Visit: Payer: Self-pay | Admitting: Internal Medicine

## 2023-09-18 DIAGNOSIS — T50905A Adverse effect of unspecified drugs, medicaments and biological substances, initial encounter: Secondary | ICD-10-CM

## 2023-09-18 DIAGNOSIS — R112 Nausea with vomiting, unspecified: Secondary | ICD-10-CM

## 2023-09-21 ENCOUNTER — Ambulatory Visit: Payer: 59 | Admitting: Nurse Practitioner

## 2023-09-21 ENCOUNTER — Encounter: Payer: Self-pay | Admitting: Nurse Practitioner

## 2023-09-21 VITALS — BP 110/84 | HR 83 | Temp 98.1°F | Resp 16 | Ht 63.0 in | Wt 167.0 lb

## 2023-09-21 DIAGNOSIS — E6609 Other obesity due to excess calories: Secondary | ICD-10-CM

## 2023-09-21 DIAGNOSIS — I10 Essential (primary) hypertension: Secondary | ICD-10-CM

## 2023-09-21 DIAGNOSIS — J301 Allergic rhinitis due to pollen: Secondary | ICD-10-CM

## 2023-09-21 DIAGNOSIS — E66811 Obesity, class 1: Secondary | ICD-10-CM

## 2023-09-21 DIAGNOSIS — M25532 Pain in left wrist: Secondary | ICD-10-CM | POA: Diagnosis not present

## 2023-09-21 DIAGNOSIS — E782 Mixed hyperlipidemia: Secondary | ICD-10-CM

## 2023-09-21 DIAGNOSIS — F411 Generalized anxiety disorder: Secondary | ICD-10-CM

## 2023-09-21 DIAGNOSIS — G8929 Other chronic pain: Secondary | ICD-10-CM

## 2023-09-21 DIAGNOSIS — Z6832 Body mass index (BMI) 32.0-32.9, adult: Secondary | ICD-10-CM

## 2023-09-21 DIAGNOSIS — F5101 Primary insomnia: Secondary | ICD-10-CM

## 2023-09-21 MED ORDER — BUPROPION HCL 75 MG PO TABS: 75.0000 mg | ORAL_TABLET | Freq: Two times a day (BID) | ORAL | 1 refills | Status: DC

## 2023-09-21 MED ORDER — GABAPENTIN 300 MG PO CAPS: ORAL_CAPSULE | ORAL | 2 refills | Status: DC

## 2023-09-21 MED ORDER — SERTRALINE HCL 100 MG PO TABS: ORAL_TABLET | ORAL | 1 refills | Status: DC

## 2023-09-21 MED ORDER — METOPROLOL SUCCINATE ER 50 MG PO TB24: ORAL_TABLET | ORAL | 1 refills | Status: DC

## 2023-09-21 MED ORDER — ALPRAZOLAM 0.5 MG PO TABS: ORAL_TABLET | ORAL | 2 refills | Status: DC

## 2023-09-21 MED ORDER — ROSUVASTATIN CALCIUM 5 MG PO TABS: 5.0000 mg | ORAL_TABLET | Freq: Every day | ORAL | 3 refills | Status: AC

## 2023-09-21 MED ORDER — MONTELUKAST SODIUM 10 MG PO TABS: ORAL_TABLET | ORAL | 3 refills | Status: AC

## 2023-09-21 NOTE — Progress Notes (Signed)
[facility] [address] [place], [place] [address]  Internal MEDICINE  Office Visit Note  Patient Name: Yvette Humphrey  [id number]  [id number]  Date of Service: 09/21/2023  Chief Complaint  Patient presents with   Hyperlipidemia   Hypertension   Follow-up    HPI Staceyann presents for a follow-up visit for weight loss, anxiety, right shoulder pain, hypertension and insomnia.  Weight loss -- has starting taking compounded semaglutide through an online provider, down 13 lbs so far. Started this medication in December.  Anxiety -- taking prn alprazolam, no issues, due for refills.  Right shoulder pain -- has an orthopedic doctor she sees Hypertension -- controlled with current medications  Insomnia -- was previously switched to trazodone which is effective, no issues.     Current Medication: Outpatient Encounter Medications as of 09/21/2023  Medication Sig   celecoxib (CELEBREX) 200 MG capsule Take 200 mg by mouth daily.   cholestyramine (QUESTRAN) 4 g packet Take 1 packet (4 g total) by mouth daily.   cyanocobalamin 500 MCG tablet Take 500 mcg by mouth daily.   diphenoxylate-atropine (LOMOTIL) 2.5-0.025 MG tablet Take 1 tablet by mouth 4 (four) times daily.   fluconazole (DIFLUCAN) 150 MG tablet Take 1 tab po  once repeat in 3 days If symptoms persists   fluticasone (FLONASE) 50 MCG/ACT nasal spray Place 1 spray into both nostrils daily.   omeprazole (PRILOSEC) 20 MG capsule Take 1 capsule (20 mg total) by mouth daily.   ondansetron (ZOFRAN) 4 MG tablet TAKE 1 TABLET(4 MG) BY MOUTH EVERY 8 HOURS AS NEEDED FOR NAUSEA OR VOMITING   traMADol (ULTRAM) 50 MG tablet Take 1 tablet (50 mg total) by mouth every 6 (six) hours as needed for severe pain (pain score 7-10).   traZODone (DESYREL) 100 MG tablet Take 1-2 tablets (100-200 mg total) by mouth at bedtime.   Ubrogepant (UBRELVY) 100 MG TABS Take 1 tablet (100 mg total) by mouth daily as needed (acute migraine).    [DISCONTINUED] ALPRAZolam (XANAX) 0.5 MG tablet TAKE 1 TABLET(0.5 MG) BY MOUTH DAILY AS NEEDED FOR ANXIETY   [DISCONTINUED] buPROPion (WELLBUTRIN) 75 MG tablet Take 1 tablet (75 mg total) by mouth 2 (two) times daily.   [DISCONTINUED] chlorpheniramine-HYDROcodone (TUSSIONEX) 10-8 MG/5ML Take 5 mLs by mouth every 12 (twelve) hours as needed for cough.   [DISCONTINUED] gabapentin (NEURONTIN) 300 MG capsule TAKE 1 CAPSULE BY MOUTH IN THE MORNING AND EVENING AND 2 CAPS AT BEDTIME   [DISCONTINUED] metoprolol succinate (TOPROL-XL) 50 MG 24 hr tablet TAKE 1 TABLET BY MOUTH EVERY NIGHT AT BEDTIME FOR BLOOD PRESSURE   [DISCONTINUED] montelukast (SINGULAIR) 10 MG tablet TAKE 1 TABLET(10 MG) BY MOUTH AT BEDTIME   [DISCONTINUED] rosuvastatin (CRESTOR) 5 MG tablet Take 1 tablet (5 mg total) by mouth daily.   [DISCONTINUED] sertraline (ZOLOFT) 100 MG tablet TAKE 1 TABLET(100 MG) BY MOUTH DAILY   [DISCONTINUED] topiramate (TOPAMAX) 25 MG tablet Take 1 tablet (25 mg total) by mouth 2 (two) times daily.   ALPRAZolam (XANAX) 0.5 MG tablet TAKE 1 TABLET(0.5 MG) BY MOUTH DAILY AS NEEDED FOR ANXIETY   buPROPion (WELLBUTRIN) 75 MG tablet Take 1 tablet (75 mg total) by mouth 2 (two) times daily.   dicyclomine (BENTYL) 10 MG capsule Take 1 capsule (10 mg total) by mouth 3 (three) times daily before meals.   gabapentin (NEURONTIN) 300 MG capsule TAKE 1 CAPSULE BY MOUTH IN THE MORNING AND EVENING AND 2 CAPS AT BEDTIME   metoprolol succinate (TOPROL-XL)  50 MG 24 hr tablet TAKE 1 TABLET BY MOUTH EVERY NIGHT AT BEDTIME FOR BLOOD PRESSURE   montelukast (SINGULAIR) 10 MG tablet TAKE 1 TABLET(10 MG) BY MOUTH AT BEDTIME   rosuvastatin (CRESTOR) 5 MG tablet Take 1 tablet (5 mg total) by mouth daily.   sertraline (ZOLOFT) 100 MG tablet TAKE 1 TABLET(100 MG) BY MOUTH DAILY   No facility-administered encounter medications on file as of 09/21/2023.    Surgical History: Past Surgical History:  Procedure Laterality Date    CHOLECYSTECTOMY     COLONOSCOPY WITH PROPOFOL N/A 06/01/2023   Procedure: COLONOSCOPY WITH PROPOFOL;  Surgeon: [name], MD;  Location: [facility] ENDOSCOPY;  Service: Endoscopy;  Laterality: N/A;   ENDOMETRIAL ABLATION     approx 2009   ESOPHAGOGASTRODUODENOSCOPY     POLYPECTOMY  06/01/2023   Procedure: POLYPECTOMY;  Surgeon: [name], MD;  Location: [facility] ENDOSCOPY;  Service: Endoscopy;;   REDUCTION MAMMAPLASTY Bilateral 2004   TONSILLECTOMY     TUBAL LIGATION     ULNAR NERVE TRANSPOSITION Left 06/08/2016   Procedure: SUBCUTANEOUS ANTERIOR ULNAR NERVE TRANSPOSITION LEFT ELBOW;  Surgeon: Christena Flake, MD;  Location: [facility];  Service: Orthopedics;  Laterality: Left;    Medical History: Past Medical History:  Diagnosis Date   Abnormal posture 05/12/2023   Complication of anesthesia    Contusion of left elbow 06/16/2023   Headache    migraines, 1x/mo   Hyperlipidemia    Hypertension    Low back pain 07/05/2022   Low back strain 07/05/2022   Motion sickness    back seat cars   Neuropathy    Pain of left hip joint 05/12/2023   PONV (postoperative nausea and vomiting)    Sacroiliac joint pain 07/05/2022   Sprain of left wrist 06/16/2023   Tachycardia    Wears contact lenses     Family History: Family History  Problem Relation Age of Onset   Hypertension Mother    Hyperlipidemia Mother    Arrhythmia Mother    Heart attack Maternal Grandfather    Hyperlipidemia Maternal Grandfather    COPD Maternal Grandfather    Breast cancer Paternal Grandmother     Social History   Socioeconomic History   Marital status: Married    Spouse name: Not on file   Number of children: Not on file   Years of education: Not on file   Highest education level: Not on file  Occupational History   Not on file  Tobacco Use   Smoking status: Never   Smokeless tobacco: Never  Vaping Use   Vaping status: Never Used  Substance and Sexual Activity   Alcohol use: Yes     Alcohol/week: 1.0 standard drink of alcohol    Types: 1 Glasses of wine per week    Comment: occasionally   Drug use: No   Sexual activity: Not on file  Other Topics Concern   Not on file  Social History Narrative   Not on file   Social Drivers of Health   Financial Resource Strain: Not on file  Food Insecurity: Not on file  Transportation Needs: Not on file  Physical Activity: Not on file  Stress: Not on file  Social Connections: Not on file  Intimate Partner Violence: Not on file      Review of Systems  Constitutional:  Positive for appetite change and unexpected weight change. Negative for chills and fatigue.  HENT:  Negative for congestion, postnasal drip, rhinorrhea, sneezing and sore throat.  Respiratory: Negative.  Negative for cough, chest tightness, shortness of breath and wheezing.   Cardiovascular: Negative.  Negative for chest pain and palpitations.  Gastrointestinal:  Positive for diarrhea. Negative for abdominal pain, constipation, nausea and vomiting.  Genitourinary:  Negative for dysuria and frequency.  Musculoskeletal:  Negative for arthralgias, back pain, joint swelling and neck pain.  Skin:  Negative for rash.  Neurological: Negative.   Hematological:  Negative for adenopathy. Does not bruise/bleed easily.  Psychiatric/Behavioral:  Positive for sleep disturbance. Negative for behavioral problems (Depression) and suicidal ideas. The patient is not nervous/anxious.     Vital Signs: BP 110/84   Pulse 83   Temp 98.1 F (36.7 C)   Resp 16   Ht 5\' 3"  (1.6 m)   Wt 167 lb (75.8 kg) Comment: AT HOME  SpO2 96%   BMI 29.58 kg/m    Physical Exam Vitals reviewed.  Constitutional:      General: She is not in acute distress.    Appearance: Normal appearance. She is obese. She is not ill-appearing.  HENT:     Head: Normocephalic and atraumatic.  Eyes:     Pupils: Pupils are equal, round, and reactive to light.  Cardiovascular:     Rate and Rhythm:  Normal rate and regular rhythm.  Pulmonary:     Effort: Pulmonary effort is normal. No respiratory distress.  Skin:    Capillary Refill: Capillary refill takes less than 2 seconds.  Neurological:     Mental Status: She is alert and oriented to person, place, and time.  Psychiatric:        Mood and Affect: Mood normal.        Behavior: Behavior normal.        Assessment/Plan: 1. Essential (primary) hypertension (Primary) Stable, continue metoprolol as prescribed.  - metoprolol succinate (TOPROL-XL) 50 MG 24 hr tablet; TAKE 1 TABLET BY MOUTH EVERY NIGHT AT BEDTIME FOR BLOOD PRESSURE  Dispense: 90 tablet; Refill: 1  2. Mixed hyperlipidemia Continue rosuvastatin as prescribed.  - rosuvastatin (CRESTOR) 5 MG tablet; Take 1 tablet (5 mg total) by mouth daily.  Dispense: 90 tablet; Refill: 3  3. Chronic pain of left wrist Continue gabapentin as prescribed.  - gabapentin (NEURONTIN) 300 MG capsule; TAKE 1 CAPSULE BY MOUTH IN THE MORNING AND EVENING AND 2 CAPS AT BEDTIME  Dispense: 120 capsule; Refill: 2  4. Non-seasonal allergic rhinitis due to pollen Continue montelukast  - montelukast (SINGULAIR) 10 MG tablet; TAKE 1 TABLET(10 MG) BY MOUTH AT BEDTIME  Dispense: 90 tablet; Refill: 3  5. Class 1 obesity due to excess calories without serious comorbidity with body mass index (BMI) of 32.0 to 32.9 in adult Continue with compounded semaglutide via other provider if desired.  6. Primary insomnia Continue trazodone as prescribed.   7. GAD (generalized anxiety disorder) Continue sertraline and bupropion as prescribed and continue prn alprazolam as prescribed.  - ALPRAZolam (XANAX) 0.5 MG tablet; TAKE 1 TABLET(0.5 MG) BY MOUTH DAILY AS NEEDED FOR ANXIETY  Dispense: 30 tablet; Refill: 2 - buPROPion (WELLBUTRIN) 75 MG tablet; Take 1 tablet (75 mg total) by mouth 2 (two) times daily.  Dispense: 180 tablet; Refill: 1 - sertraline (ZOLOFT) 100 MG tablet; TAKE 1 TABLET(100 MG) BY MOUTH DAILY   Dispense: 90 tablet; Refill: 1   General Counseling: sabria florido understanding of the findings of todays visit and agrees with plan of treatment. I have discussed any further diagnostic evaluation that may be needed or ordered today. We also reviewed  her medications today. she has been encouraged to call the office with any questions or concerns that should arise related to todays visit.    No orders of the defined types were placed in this encounter.   Meds ordered this encounter  Medications   ALPRAZolam (XANAX) 0.5 MG tablet    Sig: TAKE 1 TABLET(0.5 MG) BY MOUTH DAILY AS NEEDED FOR ANXIETY    Dispense:  30 tablet    Refill:  2   buPROPion (WELLBUTRIN) 75 MG tablet    Sig: Take 1 tablet (75 mg total) by mouth 2 (two) times daily.    Dispense:  180 tablet    Refill:  1    For future refills   metoprolol succinate (TOPROL-XL) 50 MG 24 hr tablet    Sig: TAKE 1 TABLET BY MOUTH EVERY NIGHT AT BEDTIME FOR BLOOD PRESSURE    Dispense:  90 tablet    Refill:  1    For future refills   montelukast (SINGULAIR) 10 MG tablet    Sig: TAKE 1 TABLET(10 MG) BY MOUTH AT BEDTIME    Dispense:  90 tablet    Refill:  3    For future refills   rosuvastatin (CRESTOR) 5 MG tablet    Sig: Take 1 tablet (5 mg total) by mouth daily.    Dispense:  90 tablet    Refill:  3    For future refills   sertraline (ZOLOFT) 100 MG tablet    Sig: TAKE 1 TABLET(100 MG) BY MOUTH DAILY    Dispense:  90 tablet    Refill:  1    For future refill   gabapentin (NEURONTIN) 300 MG capsule    Sig: TAKE 1 CAPSULE BY MOUTH IN THE MORNING AND EVENING AND 2 CAPS AT BEDTIME    Dispense:  120 capsule    Refill:  2    For future refills.    Return in about 3 months (around 12/13/2023) for F/U, anxiety med refill, Maelys Kinnick PCP.   Total time spent:30 Minutes Time spent includes review of chart, medications, test results, and follow up plan with the patient.   [place] Controlled Substance Database was reviewed by  me.  This patient was seen by Sallyanne Kuster, FNP-C in collaboration with Dr. Beverely Risen as a part of collaborative care agreement.   Harun Brumley R. Tedd Sias, MSN, FNP-C Internal medicine

## 2023-11-20 ENCOUNTER — Other Ambulatory Visit: Payer: Self-pay | Admitting: Nurse Practitioner

## 2023-11-20 DIAGNOSIS — G43009 Migraine without aura, not intractable, without status migrainosus: Secondary | ICD-10-CM

## 2023-11-20 NOTE — Telephone Encounter (Signed)
 Please review ok to fill last 4/24

## 2023-11-30 ENCOUNTER — Other Ambulatory Visit: Payer: Self-pay | Admitting: Nurse Practitioner

## 2023-11-30 DIAGNOSIS — R112 Nausea with vomiting, unspecified: Secondary | ICD-10-CM

## 2023-12-18 ENCOUNTER — Other Ambulatory Visit: Payer: Self-pay | Admitting: Nurse Practitioner

## 2023-12-18 ENCOUNTER — Ambulatory Visit: Payer: 59 | Admitting: Nurse Practitioner

## 2023-12-18 VITALS — BP 130/86 | HR 93 | Temp 98.1°F | Resp 16 | Ht 63.0 in | Wt 159.0 lb

## 2023-12-18 DIAGNOSIS — M542 Cervicalgia: Secondary | ICD-10-CM

## 2023-12-18 DIAGNOSIS — F411 Generalized anxiety disorder: Secondary | ICD-10-CM

## 2023-12-18 DIAGNOSIS — I10 Essential (primary) hypertension: Secondary | ICD-10-CM

## 2023-12-18 DIAGNOSIS — G43009 Migraine without aura, not intractable, without status migrainosus: Secondary | ICD-10-CM

## 2023-12-18 DIAGNOSIS — G8929 Other chronic pain: Secondary | ICD-10-CM

## 2023-12-18 DIAGNOSIS — E663 Overweight: Secondary | ICD-10-CM | POA: Diagnosis not present

## 2023-12-18 DIAGNOSIS — M064 Inflammatory polyarthropathy: Secondary | ICD-10-CM

## 2023-12-18 DIAGNOSIS — Z6828 Body mass index (BMI) 28.0-28.9, adult: Secondary | ICD-10-CM

## 2023-12-18 MED ORDER — ALPRAZOLAM 0.5 MG PO TABS: ORAL_TABLET | ORAL | 2 refills | Status: DC

## 2023-12-18 NOTE — Telephone Encounter (Signed)
 Please review

## 2023-12-18 NOTE — Progress Notes (Signed)
 [facility] [address] [place], [place] [address]  Internal MEDICINE  Office Visit Note  Patient Name: Yvette Humphrey  [id number]  [id number]  Date of Service: 12/18/2023  Chief Complaint  Patient presents with   Weight Loss   Anxiety    refills    HPI Estelene presents for a follow-up visit for anxiety, weight loss, hypertension and migraines. Anxiety --takes alprazolam  as needed  Weight loss -- still taking compounded semaglutide  from an online provider. She has lost 8 more lbs. She is doing well. Tolerating medication well.  Hypertension -- controlled with  Migraines -- takes ubrelvy  as needed, may need renewal of prior authorization. She will check with her pharmacy.  Due for pap smear in October this year     Current Medication: Outpatient Encounter Medications as of 12/18/2023  Medication Sig   ALPRAZolam  (XANAX ) 0.5 MG tablet TAKE 1 TABLET(0.5 MG) BY MOUTH DAILY AS NEEDED FOR ANXIETY   buPROPion  (WELLBUTRIN ) 75 MG tablet Take 1 tablet (75 mg total) by mouth 2 (two) times daily.   celecoxib (CELEBREX) 200 MG capsule Take 200 mg by mouth daily.   cholestyramine  (QUESTRAN ) 4 g packet Take 1 packet (4 g total) by mouth daily.   cyanocobalamin 500 MCG tablet Take 500 mcg by mouth daily.   dicyclomine  (BENTYL ) 10 MG capsule Take 1 capsule (10 mg total) by mouth 3 (three) times daily before meals.   diphenoxylate -atropine  (LOMOTIL ) 2.5-0.025 MG tablet Take 1 tablet by mouth 4 (four) times daily.   fluconazole  (DIFLUCAN ) 150 MG tablet Take 1 tab po  once repeat in 3 days If symptoms persists   fluticasone  (FLONASE ) 50 MCG/ACT nasal spray Place 1 spray into both nostrils daily.   gabapentin  (NEURONTIN ) 300 MG capsule TAKE 1 CAPSULE BY MOUTH IN THE MORNING AND EVENING AND 2 CAPS AT BEDTIME   metoprolol  succinate (TOPROL -XL) 50 MG 24 hr tablet TAKE 1 TABLET BY MOUTH EVERY NIGHT AT BEDTIME FOR BLOOD PRESSURE   montelukast  (SINGULAIR ) 10 MG tablet TAKE 1 TABLET(10 MG) BY  MOUTH AT BEDTIME   omeprazole  (PRILOSEC) 20 MG capsule Take 1 capsule (20 mg total) by mouth daily.   ondansetron  (ZOFRAN ) 4 MG tablet TAKE 1 TABLET(4 MG) BY MOUTH EVERY 8 HOURS AS NEEDED FOR NAUSEA OR VOMITING   rosuvastatin  (CRESTOR ) 5 MG tablet Take 1 tablet (5 mg total) by mouth daily.   sertraline  (ZOLOFT ) 100 MG tablet TAKE 1 TABLET(100 MG) BY MOUTH DAILY   traMADol  (ULTRAM ) 50 MG tablet Take 1 tablet (50 mg total) by mouth every 6 (six) hours as needed for severe pain (pain score 7-10).   traZODone  (DESYREL ) 100 MG tablet Take 1-2 tablets (100-200 mg total) by mouth at bedtime.   UBRELVY  100 MG TABS TAKE 1 TABLET(100 MG) BY MOUTH DAILY AS NEEDED FOR ACUTE MIGRAINE   [DISCONTINUED] ALPRAZolam  (XANAX ) 0.5 MG tablet TAKE 1 TABLET(0.5 MG) BY MOUTH DAILY AS NEEDED FOR ANXIETY   No facility-administered encounter medications on file as of 12/18/2023.    Surgical History: Past Surgical History:  Procedure Laterality Date   CHOLECYSTECTOMY     COLONOSCOPY WITH PROPOFOL  N/A 06/01/2023   Procedure: COLONOSCOPY WITH PROPOFOL ;  Surgeon: Marnee Sink, MD;  Location: [facility] ENDOSCOPY;  Service: Endoscopy;  Laterality: N/A;   ENDOMETRIAL ABLATION     approx 2009   ESOPHAGOGASTRODUODENOSCOPY     POLYPECTOMY  06/01/2023   Procedure: POLYPECTOMY;  Surgeon: Marnee Sink, MD;  Location: [facility] ENDOSCOPY;  Service: Endoscopy;;   REDUCTION MAMMAPLASTY  Bilateral 2004   TONSILLECTOMY     TUBAL LIGATION     ULNAR NERVE TRANSPOSITION Left 06/08/2016   Procedure: SUBCUTANEOUS ANTERIOR ULNAR NERVE TRANSPOSITION LEFT ELBOW;  Surgeon: Elner Hahn, MD;  Location: [facility];  Service: Orthopedics;  Laterality: Left;    Medical History: Past Medical History:  Diagnosis Date   Abnormal posture 05/12/2023   Complication of anesthesia    Contusion of left elbow 06/16/2023   Headache    migraines, 1x/mo   Hyperlipidemia    Hypertension    Low back pain 07/05/2022   Low back strain 07/05/2022    Motion sickness    back seat cars   Neuropathy    Pain of left hip joint 05/12/2023   PONV (postoperative nausea and vomiting)    Sacroiliac joint pain 07/05/2022   Sprain of left wrist 06/16/2023   Tachycardia    Wears contact lenses     Family History: Family History  Problem Relation Age of Onset   Hypertension Mother    Hyperlipidemia Mother    Arrhythmia Mother    Heart attack Maternal Grandfather    Hyperlipidemia Maternal Grandfather    COPD Maternal Grandfather    Breast cancer Paternal Grandmother     Social History   Socioeconomic History   Marital status: Married    Spouse name: Not on file   Number of children: Not on file   Years of education: Not on file   Highest education level: Not on file  Occupational History   Not on file  Tobacco Use   Smoking status: Never   Smokeless tobacco: Never  Vaping Use   Vaping status: Never Used  Substance and Sexual Activity   Alcohol use: Yes    Alcohol/week: 1.0 standard drink of alcohol    Types: 1 Glasses of wine per week    Comment: occasionally   Drug use: No   Sexual activity: Not on file  Other Topics Concern   Not on file  Social History Narrative   Not on file   Social Drivers of Health   Financial Resource Strain: Not on file  Food Insecurity: Not on file  Transportation Needs: Not on file  Physical Activity: Not on file  Stress: Not on file  Social Connections: Not on file  Intimate Partner Violence: Not on file      Review of Systems  Constitutional:  Positive for appetite change and unexpected weight change. Negative for chills and fatigue.  HENT:  Negative for congestion, postnasal drip, rhinorrhea, sneezing and sore throat.   Respiratory: Negative.  Negative for cough, chest tightness, shortness of breath and wheezing.   Cardiovascular: Negative.  Negative for chest pain and palpitations.  Gastrointestinal:  Positive for diarrhea. Negative for abdominal pain, constipation, nausea and  vomiting.  Genitourinary:  Negative for dysuria and frequency.  Musculoskeletal:  Negative for arthralgias, back pain, joint swelling and neck pain.  Skin:  Negative for rash.  Neurological: Negative.   Hematological:  Negative for adenopathy. Does not bruise/bleed easily.  Psychiatric/Behavioral:  Positive for sleep disturbance. Negative for behavioral problems (Depression) and suicidal ideas. The patient is not nervous/anxious.     Vital Signs: BP 130/86 (Cuff Size: Normal)   Pulse 93   Temp 98.1 F (36.7 C) (Temporal)   Resp 16   Ht 5\' 3"  (1.6 m)   Wt 159 lb (72.1 kg)   SpO2 98%   BMI 28.17 kg/m    Physical Exam Vitals reviewed.  Constitutional:  General: She is not in acute distress.    Appearance: Normal appearance. She is obese. She is not ill-appearing.  HENT:     Head: Normocephalic and atraumatic.  Eyes:     Pupils: Pupils are equal, round, and reactive to light.  Cardiovascular:     Rate and Rhythm: Normal rate and regular rhythm.  Pulmonary:     Effort: Pulmonary effort is normal. No respiratory distress.  Skin:    Capillary Refill: Capillary refill takes less than 2 seconds.  Neurological:     Mental Status: She is alert and oriented to person, place, and time.  Psychiatric:        Mood and Affect: Mood normal.        Behavior: Behavior normal.        Assessment/Plan: 1. Essential (primary) hypertension (Primary) Stable, continue metoprolol  as prescribed.   2. Migraine without aura and without status migrainosus, not intractable Continue ubrelvy  as needed for acute migraines  3. Overweight with body mass index (BMI) of 28 to 28.9 in adult Lost 8 more lbs, still using compounded semaglutide  from another provider.   4. GAD (generalized anxiety disorder) Continue alprazolam  as prescribed. Follow up in 3 months for additional refills.  - ALPRAZolam  (XANAX ) 0.5 MG tablet; TAKE 1 TABLET(0.5 MG) BY MOUTH DAILY AS NEEDED FOR ANXIETY  Dispense: 30  tablet; Refill: 2   General Counseling: sarahanne novakowski understanding of the findings of todays visit and agrees with plan of treatment. I have discussed any further diagnostic evaluation that may be needed or ordered today. We also reviewed her medications today. she has been encouraged to call the office with any questions or concerns that should arise related to todays visit.    No orders of the defined types were placed in this encounter.   Meds ordered this encounter  Medications   ALPRAZolam  (XANAX ) 0.5 MG tablet    Sig: TAKE 1 TABLET(0.5 MG) BY MOUTH DAILY AS NEEDED FOR ANXIETY    Dispense:  30 tablet    Refill:  2    Return in about 3 months (around 03/13/2024) for F/U, anxiety med refill, Dorsel Flinn PCP.   Total time spent:30 Minutes Time spent includes review of chart, medications, test results, and follow up plan with the patient.   [place] Controlled Substance Database was reviewed by me.  This patient was seen by Laurence Pons, FNP-C in collaboration with Dr. Verneta Gone as a part of collaborative care agreement.   Jocelyn Nold R. Bobbi Burow, MSN, FNP-C Internal medicine

## 2023-12-25 ENCOUNTER — Encounter: Payer: Self-pay | Admitting: Nurse Practitioner

## 2024-01-10 ENCOUNTER — Other Ambulatory Visit: Payer: Self-pay | Admitting: Nurse Practitioner

## 2024-01-10 DIAGNOSIS — R112 Nausea with vomiting, unspecified: Secondary | ICD-10-CM

## 2024-01-28 ENCOUNTER — Other Ambulatory Visit: Payer: Self-pay | Admitting: Nurse Practitioner

## 2024-01-28 DIAGNOSIS — G8929 Other chronic pain: Secondary | ICD-10-CM

## 2024-02-12 ENCOUNTER — Encounter: Payer: Self-pay | Admitting: Nurse Practitioner

## 2024-02-16 ENCOUNTER — Ambulatory Visit: Payer: Self-pay | Admitting: Cardiology

## 2024-02-20 ENCOUNTER — Other Ambulatory Visit: Payer: Self-pay | Admitting: Nurse Practitioner

## 2024-02-21 ENCOUNTER — Ambulatory Visit: Payer: Self-pay | Admitting: Cardiology

## 2024-02-23 ENCOUNTER — Ambulatory Visit: Payer: Self-pay | Admitting: Cardiology

## 2024-03-14 ENCOUNTER — Ambulatory Visit (INDEPENDENT_AMBULATORY_CARE_PROVIDER_SITE_OTHER): Admitting: Nurse Practitioner

## 2024-03-14 ENCOUNTER — Encounter: Payer: Self-pay | Admitting: Nurse Practitioner

## 2024-03-14 VITALS — BP 126/74 | HR 98 | Temp 98.1°F | Resp 16 | Ht 63.0 in | Wt 157.0 lb

## 2024-03-14 DIAGNOSIS — I10 Essential (primary) hypertension: Secondary | ICD-10-CM | POA: Diagnosis not present

## 2024-03-14 DIAGNOSIS — G43009 Migraine without aura, not intractable, without status migrainosus: Secondary | ICD-10-CM

## 2024-03-14 DIAGNOSIS — M25512 Pain in left shoulder: Secondary | ICD-10-CM

## 2024-03-14 DIAGNOSIS — M25532 Pain in left wrist: Secondary | ICD-10-CM | POA: Diagnosis not present

## 2024-03-14 DIAGNOSIS — G8929 Other chronic pain: Secondary | ICD-10-CM

## 2024-03-14 DIAGNOSIS — N951 Menopausal and female climacteric states: Secondary | ICD-10-CM

## 2024-03-14 DIAGNOSIS — M064 Inflammatory polyarthropathy: Secondary | ICD-10-CM

## 2024-03-14 DIAGNOSIS — F411 Generalized anxiety disorder: Secondary | ICD-10-CM

## 2024-03-14 DIAGNOSIS — M542 Cervicalgia: Secondary | ICD-10-CM

## 2024-03-14 MED ORDER — UBRELVY 100 MG PO TABS: ORAL_TABLET | ORAL | 3 refills | Status: DC

## 2024-03-14 MED ORDER — DICLOFENAC SODIUM 75 MG PO TBEC: 75.0000 mg | DELAYED_RELEASE_TABLET | Freq: Two times a day (BID) | ORAL | 2 refills | Status: DC

## 2024-03-14 MED ORDER — BUPROPION HCL 75 MG PO TABS: 75.0000 mg | ORAL_TABLET | Freq: Two times a day (BID) | ORAL | 1 refills | Status: DC

## 2024-03-14 MED ORDER — METOPROLOL SUCCINATE ER 50 MG PO TB24
ORAL_TABLET | ORAL | 1 refills | Status: DC
Start: 2024-03-14 — End: 2024-06-03

## 2024-03-14 MED ORDER — ALPRAZOLAM 0.5 MG PO TABS: ORAL_TABLET | ORAL | 2 refills | Status: DC

## 2024-03-14 MED ORDER — VENLAFAXINE HCL ER 75 MG PO CP24
75.0000 mg | ORAL_CAPSULE | Freq: Every day | ORAL | 3 refills | Status: AC
Start: 2024-03-14 — End: ?

## 2024-03-14 NOTE — Progress Notes (Unsigned)
  Cardiology Office Note   Date:  03/15/2024  ID:  HAIDEE STOGSDILL, DOB Apr 07, 1976, MRN [id number] PCP: Liana Fish, NP  [facility] Providers Cardiologist:  [name], MD    History of Present Illness Yvette Humphrey is a 48 y.o. female with a h/o palpitations, pSVT, tobacco use who presents for 1 year follow-up.   She has a long history of palpitations on metoprolol . Heart monitor 08/2022 showed NSR, 6 short runs of SVT.   She was last seen 10/2022 and was stable from a cardiac perspective.   Today, the patient reports she is overall doing well. She has occasional flutters. They can occur every couple months or a couple days in a row.  Diet is mostly  healthy. She doesn't eat a lot of salt. She denies chest pain, SOB, lower leg edema, lightheadedness or dizziness. BP is normally 120/70.  Studies Reviewed EKG Interpretation Date/Time:  Friday March 15 2024 13:30:15 EDT Ventricular Rate:  92 PR Interval:  148 QRS Duration:  82 QT Interval:  348 QTC Calculation: 430 R Axis:   3  Text Interpretation: Normal sinus rhythm Cannot rule out Anterior infarct , age undetermined No previous ECGs available Confirmed by Franchester, Dolan Xia ([id number]) on 03/15/2024 1:35:05 PM    Heart monitor 09/2022 Patch Wear Time:  13 days and 23 hours (2024-01-19T10:28:51-0500 to 2024-02-02T09:34:07-499)   Patient had a min HR of 63 bpm, max HR of 164 bpm, and avg HR of 89 bpm. Predominant underlying rhythm was Sinus Rhythm. 6 Supraventricular Tachycardia runs occurred, the run with the fastest interval lasting 6 beats with a max rate of 164 bpm, the  longest lasting 14 beats with an avg rate of 145 bpm. Supraventricular Tachycardia was detected within +/- 45 seconds of symptomatic patient event(s). Isolated SVEs were rare (<1.0%), SVE Couplets were rare (<1.0%), and SVE Triplets were rare (<1.0%).  Isolated VEs were rare (<1.0%), VE Couplets were rare (<1.0%), and no VE Triplets were present.        Physical Exam VS:  BP 94/60 (BP Location: Left Arm, Patient Position: Sitting, Cuff Size: Normal)   Pulse 92   Ht 5' 3 (1.6 m)   Wt 163 lb 3.2 oz (74 kg)   SpO2 99%   BMI 28.91 kg/m        Wt Readings from Last 3 Encounters:  03/15/24 163 lb 3.2 oz (74 kg)  03/14/24 157 lb (71.2 kg)  12/18/23 159 lb (72.1 kg)    GEN: Well nourished, well developed in no acute distress NECK: No JVD; No carotid bruits CARDIAC: RRR, no murmurs, rubs, gallops RESPIRATORY:  Clear to auscultation without rales, wheezing or rhonchi  ABDOMEN: Soft, non-tender, non-distended EXTREMITIES:  No edema; No deformity   ASSESSMENT AND PLAN  pSVT Palpitations Patient reports intermittent palpitations that are overall controlled. BP is low, but patient says this is abnormal. She reports diet is mostly healthy. She just got back into exercise. Continue Toprol  50mg  daily at night. Labs from last year are stable.   HLD LDL 89. Continue Crestor  5mg  daily.    Dispo: Follow-up in 1 year  Signed, Sloane Junkin VEAR Franchester, PA-C

## 2024-03-14 NOTE — Progress Notes (Signed)
 [facility] [address] [place], [place] [address]  Internal MEDICINE  Office Visit Note  Patient Name: Yvette Humphrey  [id number]  [id number]  Date of Service: 03/14/2024  Chief Complaint  Patient presents with   Hyperlipidemia   Hypertension   Follow-up    HPI Dayla presents for a follow-up visit for perimenopause, hypertension, migraines, and anxiety. Perimenopause sx -- brain fog, night sweats, itching, trouble sleeping, some hot flashes, anhedonia, maybe mood swings, decreased libido, vaginal dryness, dyspareunia.  Migraines -- takes ubrelvy  as needed  Anxiety -- taking alprazolam  as needed and takes bupropion  daily.  Hypertension -- controlled with current medication.     Current Medication: Outpatient Encounter Medications as of 03/14/2024  Medication Sig   venlafaxine  XR (EFFEXOR -XR) 75 MG 24 hr capsule Take 1 capsule (75 mg total) by mouth daily with breakfast.   ALPRAZolam  (XANAX ) 0.5 MG tablet TAKE 1 TABLET(0.5 MG) BY MOUTH DAILY AS NEEDED FOR ANXIETY   buPROPion  (WELLBUTRIN ) 75 MG tablet Take 1 tablet (75 mg total) by mouth 2 (two) times daily.   cholestyramine  (QUESTRAN ) 4 g packet Take 1 packet (4 g total) by mouth daily.   cyanocobalamin 500 MCG tablet Take 500 mcg by mouth daily.   diclofenac  (VOLTAREN ) 75 MG EC tablet Take 1 tablet (75 mg total) by mouth 2 (two) times daily.   dicyclomine  (BENTYL ) 10 MG capsule Take 1 capsule (10 mg total) by mouth 3 (three) times daily before meals.   diphenoxylate -atropine  (LOMOTIL ) 2.5-0.025 MG tablet Take 1 tablet by mouth 4 (four) times daily.   fluconazole  (DIFLUCAN ) 150 MG tablet Take 1 tab po  once repeat in 3 days If symptoms persists   fluticasone  (FLONASE ) 50 MCG/ACT nasal spray Place 1 spray into both nostrils daily.   gabapentin  (NEURONTIN ) 300 MG capsule TAKE 1 CAPSULE BY MOUTH EVERY MORNING AND EVERY EVENING AND 2 CAPSULES EVERY NIGHT AT BEDTIME   metoprolol  succinate (TOPROL -XL) 50 MG 24 hr tablet  TAKE 1 TABLET BY MOUTH EVERY NIGHT AT BEDTIME FOR BLOOD PRESSURE   montelukast  (SINGULAIR ) 10 MG tablet TAKE 1 TABLET(10 MG) BY MOUTH AT BEDTIME   omeprazole  (PRILOSEC) 20 MG capsule Take 1 capsule (20 mg total) by mouth daily.   ondansetron  (ZOFRAN ) 4 MG tablet TAKE 1 TABLET(4 MG) BY MOUTH EVERY 8 HOURS AS NEEDED FOR NAUSEA OR VOMITING   rosuvastatin  (CRESTOR ) 5 MG tablet Take 1 tablet (5 mg total) by mouth daily.   traMADol  (ULTRAM ) 50 MG tablet Take 1 tablet (50 mg total) by mouth every 6 (six) hours as needed for severe pain (pain score 7-10).   traZODone  (DESYREL ) 100 MG tablet TAKE 1 TO 2 TABLETS(100 TO 200 MG) BY MOUTH AT BEDTIME   Ubrogepant  (UBRELVY ) 100 MG TABS TAKE 1 TABLET(100 MG) BY MOUTH DAILY AS NEEDED FOR ACUTE MIGRAINE   [DISCONTINUED] ALPRAZolam  (XANAX ) 0.5 MG tablet TAKE 1 TABLET(0.5 MG) BY MOUTH DAILY AS NEEDED FOR ANXIETY   [DISCONTINUED] buPROPion  (WELLBUTRIN ) 75 MG tablet Take 1 tablet (75 mg total) by mouth 2 (two) times daily.   [DISCONTINUED] diclofenac  (VOLTAREN ) 75 MG EC tablet TAKE 1 TABLET(75 MG) BY MOUTH TWICE DAILY   [DISCONTINUED] metoprolol  succinate (TOPROL -XL) 50 MG 24 hr tablet TAKE 1 TABLET BY MOUTH EVERY NIGHT AT BEDTIME FOR BLOOD PRESSURE   [DISCONTINUED] sertraline  (ZOLOFT ) 100 MG tablet TAKE 1 TABLET(100 MG) BY MOUTH DAILY   [DISCONTINUED] UBRELVY  100 MG TABS TAKE 1 TABLET(100 MG) BY MOUTH DAILY AS NEEDED FOR ACUTE MIGRAINE  No facility-administered encounter medications on file as of 03/14/2024.    Surgical History: Past Surgical History:  Procedure Laterality Date   CHOLECYSTECTOMY     COLONOSCOPY WITH PROPOFOL  N/A 06/01/2023   Procedure: COLONOSCOPY WITH PROPOFOL ;  Surgeon: Jinny Carmine, MD;  Location: [facility] ENDOSCOPY;  Service: Endoscopy;  Laterality: N/A;   ENDOMETRIAL ABLATION     approx 2009   ESOPHAGOGASTRODUODENOSCOPY     POLYPECTOMY  06/01/2023   Procedure: POLYPECTOMY;  Surgeon: Jinny Carmine, MD;  Location: [facility] ENDOSCOPY;  Service:  Endoscopy;;   REDUCTION MAMMAPLASTY Bilateral 2004   TONSILLECTOMY     TUBAL LIGATION     ULNAR NERVE TRANSPOSITION Left 06/08/2016   Procedure: SUBCUTANEOUS ANTERIOR ULNAR NERVE TRANSPOSITION LEFT ELBOW;  Surgeon: Norleen JINNY Maltos, MD;  [facility]: [facility];  Service: Orthopedics;  Laterality: Left;    Medical History: Past Medical History:  Diagnosis Date   Abnormal posture 05/12/2023   Complication of anesthesia    Contusion of left elbow 06/16/2023   Headache    migraines, 1x/mo   Hyperlipidemia    Hypertension    Low back pain 07/05/2022   Low back strain 07/05/2022   Motion sickness    back seat cars   Neuropathy    Pain of left hip joint 05/12/2023   PONV (postoperative nausea and vomiting)    Sacroiliac joint pain 07/05/2022   Sprain of left wrist 06/16/2023   Tachycardia    Wears contact lenses     Family History: Family History  Problem Relation Age of Onset   Hypertension Mother    Hyperlipidemia Mother    Arrhythmia Mother    Heart attack Maternal Grandfather    Hyperlipidemia Maternal Grandfather    COPD Maternal Grandfather    Breast cancer Paternal Grandmother     Social History   Socioeconomic History   Marital status: Married    Spouse name: Not on file   Number of children: Not on file   Years of education: Not on file   Highest education level: Not on file  Occupational History   Not on file  Tobacco Use   Smoking status: Never   Smokeless tobacco: Never  Vaping Use   Vaping status: Never Used  Substance and Sexual Activity   Alcohol use: Yes    Alcohol/week: 1.0 standard drink of alcohol    Types: 1 Glasses of wine per week    Comment: occasionally   Drug use: No   Sexual activity: Not on file  Other Topics Concern   Not on file  Social History Narrative   Not on file   Social Drivers of Health   Financial Resource Strain: Not on file  Food Insecurity: Not on file  Transportation Needs: Not on file  Physical Activity:  Not on file  Stress: Not on file  Social Connections: Not on file  Intimate Partner Violence: Not on file      Review of Systems  Constitutional:  Positive for appetite change and unexpected weight change. Negative for chills and fatigue.  HENT:  Negative for congestion, postnasal drip, rhinorrhea, sneezing and sore throat.   Respiratory: Negative.  Negative for cough, chest tightness, shortness of breath and wheezing.   Cardiovascular: Negative.  Negative for chest pain and palpitations.  Gastrointestinal:  Positive for diarrhea. Negative for abdominal pain, constipation, nausea and vomiting.  Genitourinary:  Negative for dysuria and frequency.  Musculoskeletal:  Negative for arthralgias, back pain, joint swelling and neck pain.  Skin:  Negative for  rash.  Neurological: Negative.   Hematological:  Negative for adenopathy. Does not bruise/bleed easily.  Psychiatric/Behavioral:  Positive for sleep disturbance. Negative for behavioral problems (Depression) and suicidal ideas. The patient is not nervous/anxious.     Vital Signs: BP 126/74   Pulse 98   Temp 98.1 F (36.7 C)   Resp 16   Ht 5' 3 (1.6 m)   Wt 157 lb (71.2 kg) Comment: at home  SpO2 96%   BMI 27.81 kg/m    Physical Exam Vitals reviewed.  Constitutional:      General: She is not in acute distress.    Appearance: Normal appearance. She is obese. She is not ill-appearing.  HENT:     Head: Normocephalic and atraumatic.  Eyes:     Pupils: Pupils are equal, round, and reactive to light.  Cardiovascular:     Rate and Rhythm: Normal rate and regular rhythm.  Pulmonary:     Effort: Pulmonary effort is normal. No respiratory distress.  Skin:    Capillary Refill: Capillary refill takes less than 2 seconds.  Neurological:     Mental Status: She is alert and oriented to person, place, and time.  Psychiatric:        Mood and Affect: Mood normal.        Behavior: Behavior normal.        Assessment/Plan: 1.  Essential (primary) hypertension (Primary) Continue metoprolol  as prescribed.  - metoprolol  succinate (TOPROL -XL) 50 MG 24 hr tablet; TAKE 1 TABLET BY MOUTH EVERY NIGHT AT BEDTIME FOR BLOOD PRESSURE  Dispense: 90 tablet; Refill: 1  2. Vasomotor symptoms due to menopause Will try venlafaxine  as prescribed. Follow up in 4 weeks to reassess.  - venlafaxine  XR (EFFEXOR -XR) 75 MG 24 hr capsule; Take 1 capsule (75 mg total) by mouth daily with breakfast.  Dispense: 30 capsule; Refill: 3  3. Migraine without aura and without status migrainosus, not intractable Continue ubrelvy  as prescribed as needed and will start venlafaxine  for perimenopause but it may also help prevent migraines.  - venlafaxine  XR (EFFEXOR -XR) 75 MG 24 hr capsule; Take 1 capsule (75 mg total) by mouth daily with breakfast.  Dispense: 30 capsule; Refill: 3 - Ubrogepant  (UBRELVY ) 100 MG TABS; TAKE 1 TABLET(100 MG) BY MOUTH DAILY AS NEEDED FOR ACUTE MIGRAINE  Dispense: 16 tablet; Refill: 3  4. Chronic pain of left wrist Take diclofenac  as prescribed.  - diclofenac  (VOLTAREN ) 75 MG EC tablet; Take 1 tablet (75 mg total) by mouth 2 (two) times daily.  Dispense: 60 tablet; Refill: 2  5. Chronic left shoulder pain Take diclofenac  as prescribed.  - diclofenac  (VOLTAREN ) 75 MG EC tablet; Take 1 tablet (75 mg total) by mouth 2 (two) times daily.  Dispense: 60 tablet; Refill: 2  6. Inflammatory polyarthropathy of multiple sites (HCC) Take diclofenac  as prescribed.  - diclofenac  (VOLTAREN ) 75 MG EC tablet; Take 1 tablet (75 mg total) by mouth 2 (two) times daily.  Dispense: 60 tablet; Refill: 2  7. Neck pain on left side Take diclofenac  as prescribed.  - diclofenac  (VOLTAREN ) 75 MG EC tablet; Take 1 tablet (75 mg total) by mouth 2 (two) times daily.  Dispense: 60 tablet; Refill: 2  8. GAD (generalized anxiety disorder) Continue bupropion  and prn alprazolam  as prescribed. Follow up in 3 months for additional refills.  - ALPRAZolam   (XANAX ) 0.5 MG tablet; TAKE 1 TABLET(0.5 MG) BY MOUTH DAILY AS NEEDED FOR ANXIETY  Dispense: 30 tablet; Refill: 2 - buPROPion  (WELLBUTRIN ) 75 MG tablet; Take  1 tablet (75 mg total) by mouth 2 (two) times daily.  Dispense: 180 tablet; Refill: 1   General Counseling: cylah fannin understanding of the findings of todays visit and agrees with plan of treatment. I have discussed any further diagnostic evaluation that may be needed or ordered today. We also reviewed her medications today. she has been encouraged to call the office with any questions or concerns that should arise related to todays visit.    No orders of the defined types were placed in this encounter.   Meds ordered this encounter  Medications   ALPRAZolam  (XANAX ) 0.5 MG tablet    Sig: TAKE 1 TABLET(0.5 MG) BY MOUTH DAILY AS NEEDED FOR ANXIETY    Dispense:  30 tablet    Refill:  2   venlafaxine  XR (EFFEXOR -XR) 75 MG 24 hr capsule    Sig: Take 1 capsule (75 mg total) by mouth daily with breakfast.    Dispense:  30 capsule    Refill:  3    Fill new script today, discontinue sertraline .   Ubrogepant  (UBRELVY ) 100 MG TABS    Sig: TAKE 1 TABLET(100 MG) BY MOUTH DAILY AS NEEDED FOR ACUTE MIGRAINE    Dispense:  16 tablet    Refill:  3   metoprolol  succinate (TOPROL -XL) 50 MG 24 hr tablet    Sig: TAKE 1 TABLET BY MOUTH EVERY NIGHT AT BEDTIME FOR BLOOD PRESSURE    Dispense:  90 tablet    Refill:  1    For future refills   diclofenac  (VOLTAREN ) 75 MG EC tablet    Sig: Take 1 tablet (75 mg total) by mouth 2 (two) times daily.    Dispense:  60 tablet    Refill:  2   buPROPion  (WELLBUTRIN ) 75 MG tablet    Sig: Take 1 tablet (75 mg total) by mouth 2 (two) times daily.    Dispense:  180 tablet    Refill:  1    For future refills    Return in about 4 weeks (around 04/11/2024) for F/U, eval new med, Beverlee Wilmarth PCP.   Total time spent:30 Minutes Time spent includes review of chart, medications, test results, and follow up plan  with the patient.    Controlled Substance Database was reviewed by me.  This patient was seen by Mardy Maxin, FNP-C in collaboration with Dr. Sigrid Bathe as a part of collaborative care agreement.   Maurie Olesen R. Maxin, MSN, FNP-C Internal medicine

## 2024-03-15 ENCOUNTER — Encounter: Payer: Self-pay | Admitting: Medical

## 2024-03-15 ENCOUNTER — Ambulatory Visit: Payer: Self-pay | Attending: Medical | Admitting: Medical

## 2024-03-15 VITALS — BP 94/60 | HR 92 | Ht 63.0 in | Wt 163.2 lb

## 2024-03-15 DIAGNOSIS — I471 Supraventricular tachycardia, unspecified: Secondary | ICD-10-CM | POA: Diagnosis not present

## 2024-03-15 DIAGNOSIS — E782 Mixed hyperlipidemia: Secondary | ICD-10-CM | POA: Diagnosis not present

## 2024-03-15 NOTE — Patient Instructions (Signed)
 Medication Instructions:  The current medical regimen is effective;  continue present plan and medications as directed. Please refer to the Current Medication list given to you today.   *If you need a refill on your cardiac medications before your next appointment, please call your pharmacy*  Follow-Up: At [facility], you and your health needs are our priority.  As part of our continuing mission to provide you with exceptional heart care, our providers are all part of one team.  This team includes your primary Cardiologist (physician) and Advanced Practice Providers or APPs (Physician Assistants and Nurse Practitioners) who all work together to provide you with the care you need, when you need it.  Your next appointment:   12 month(s)  Provider:   You may see [name], MD or one of the following Advanced Practice Providers on your designated Care Team:   Lonni Meager, NP Lesley Maffucci, PA-C Bernardino Bring, PA-C Cadence Anthon, PA-C Tylene Lunch, NP Barnie Hila, NP    We recommend signing up for the patient portal called MyChart.  Sign up information is provided on this After Visit Summary.  MyChart is used to connect with patients for Virtual Visits (Telemedicine).  Patients are able to view lab/test results, encounter notes, upcoming appointments, etc.  Non-urgent messages can be sent to your provider as well.   To learn more about what you can do with MyChart, go to [email or web address].

## 2024-04-09 ENCOUNTER — Encounter: Payer: Self-pay | Admitting: Nurse Practitioner

## 2024-04-09 DIAGNOSIS — N951 Menopausal and female climacteric states: Secondary | ICD-10-CM | POA: Insufficient documentation

## 2024-04-09 DIAGNOSIS — G8929 Other chronic pain: Secondary | ICD-10-CM | POA: Insufficient documentation

## 2024-04-11 ENCOUNTER — Encounter: Payer: Self-pay | Admitting: Nurse Practitioner

## 2024-04-11 ENCOUNTER — Ambulatory Visit (INDEPENDENT_AMBULATORY_CARE_PROVIDER_SITE_OTHER): Admitting: Nurse Practitioner

## 2024-04-11 VITALS — BP 100/74 | HR 92 | Temp 98.1°F | Resp 16 | Ht 63.0 in | Wt 159.0 lb

## 2024-04-11 DIAGNOSIS — G8929 Other chronic pain: Secondary | ICD-10-CM

## 2024-04-11 DIAGNOSIS — G43009 Migraine without aura, not intractable, without status migrainosus: Secondary | ICD-10-CM

## 2024-04-11 DIAGNOSIS — R232 Flushing: Secondary | ICD-10-CM

## 2024-04-11 DIAGNOSIS — N951 Menopausal and female climacteric states: Secondary | ICD-10-CM | POA: Diagnosis not present

## 2024-04-11 DIAGNOSIS — M25532 Pain in left wrist: Secondary | ICD-10-CM | POA: Diagnosis not present

## 2024-04-11 DIAGNOSIS — Z1231 Encounter for screening mammogram for malignant neoplasm of breast: Secondary | ICD-10-CM

## 2024-04-11 MED ORDER — VENLAFAXINE HCL ER 75 MG PO CP24: 75.0000 mg | ORAL_CAPSULE | Freq: Every day | ORAL | 3 refills | Status: AC

## 2024-04-11 MED ORDER — GABAPENTIN 300 MG PO CAPS: ORAL_CAPSULE | ORAL | 5 refills | Status: AC

## 2024-04-11 NOTE — Progress Notes (Signed)
 [facility] [address] [place], [place] [address]  Internal MEDICINE  Office Visit Note  Patient Name: Yvette Humphrey  [id number]  [id number]  Date of Service: 04/11/2024  Chief Complaint  Patient presents with   Hyperlipidemia   Hypertension   Follow-up    HPI Yvette Humphrey presents for a follow-up visit for vasomotor symptoms, hot flashes, migraines  Venlafaxine  is helping with vasomotor symptoms and she wants to stay on it but the night sweats and hot flashes have not improved much.  Hot flashes/night sweats -- still having hot flashes, taking gabapentin  which helps some Due for routine mammogram.  Migraines -- taking venlafaxine  and takes ubrelvy  as needed.     Current Medication: Outpatient Encounter Medications as of 04/11/2024  Medication Sig   ALPRAZolam  (XANAX ) 0.5 MG tablet TAKE 1 TABLET(0.5 MG) BY MOUTH DAILY AS NEEDED FOR ANXIETY   cholestyramine  (QUESTRAN ) 4 g packet Take 1 packet (4 g total) by mouth daily. (Patient taking differently: Take 4 g by mouth as needed.)   cyanocobalamin 500 MCG tablet Take 500 mcg by mouth daily.   diclofenac  (VOLTAREN ) 75 MG EC tablet Take 1 tablet (75 mg total) by mouth 2 (two) times daily.   dicyclomine  (BENTYL ) 10 MG capsule Take 1 capsule (10 mg total) by mouth 3 (three) times daily before meals. (Patient not taking: Reported on 03/15/2024)   diphenoxylate -atropine  (LOMOTIL ) 2.5-0.025 MG tablet Take 1 tablet by mouth 4 (four) times daily.   fluconazole  (DIFLUCAN ) 150 MG tablet Take 1 tab po  once repeat in 3 days If symptoms persists   fluticasone  (FLONASE ) 50 MCG/ACT nasal spray Place 1 spray into both nostrils daily.   gabapentin  (NEURONTIN ) 300 MG capsule TAKE 1 CAPSULE BY MOUTH EVERY MORNING, then take 1 capsule at lunch AND 3 CAPSULES EVERY NIGHT AT BEDTIME   metoprolol  succinate (TOPROL -XL) 50 MG 24 hr tablet TAKE 1 TABLET BY MOUTH EVERY NIGHT AT BEDTIME FOR BLOOD PRESSURE   montelukast  (SINGULAIR ) 10 MG tablet TAKE 1  TABLET(10 MG) BY MOUTH AT BEDTIME   omeprazole  (PRILOSEC) 20 MG capsule Take 1 capsule (20 mg total) by mouth daily.   ondansetron  (ZOFRAN ) 4 MG tablet TAKE 1 TABLET(4 MG) BY MOUTH EVERY 8 HOURS AS NEEDED FOR NAUSEA OR VOMITING   rosuvastatin  (CRESTOR ) 5 MG tablet Take 1 tablet (5 mg total) by mouth daily.   traMADol  (ULTRAM ) 50 MG tablet Take 1 tablet (50 mg total) by mouth every 6 (six) hours as needed for severe pain (pain score 7-10).   traZODone  (DESYREL ) 100 MG tablet TAKE 1 TO 2 TABLETS(100 TO 200 MG) BY MOUTH AT BEDTIME   Ubrogepant  (UBRELVY ) 100 MG TABS TAKE 1 TABLET(100 MG) BY MOUTH DAILY AS NEEDED FOR ACUTE MIGRAINE   venlafaxine  XR (EFFEXOR -XR) 75 MG 24 hr capsule Take 1 capsule (75 mg total) by mouth daily with breakfast.   [DISCONTINUED] buPROPion  (WELLBUTRIN ) 75 MG tablet Take 1 tablet (75 mg total) by mouth 2 (two) times daily.   [DISCONTINUED] gabapentin  (NEURONTIN ) 300 MG capsule TAKE 1 CAPSULE BY MOUTH EVERY MORNING AND EVERY EVENING AND 2 CAPSULES EVERY NIGHT AT BEDTIME   [DISCONTINUED] venlafaxine  XR (EFFEXOR -XR) 75 MG 24 hr capsule Take 1 capsule (75 mg total) by mouth daily with breakfast.   No facility-administered encounter medications on file as of 04/11/2024.    Surgical History: Past Surgical History:  Procedure Laterality Date   CHOLECYSTECTOMY     COLONOSCOPY WITH PROPOFOL  N/A 06/01/2023   Procedure: COLONOSCOPY WITH PROPOFOL ;  Surgeon: Jinny Carmine, MD;  Location: [facility] ENDOSCOPY;  Service: Endoscopy;  Laterality: N/A;   ENDOMETRIAL ABLATION     approx 2009   ESOPHAGOGASTRODUODENOSCOPY     POLYPECTOMY  06/01/2023   Procedure: POLYPECTOMY;  Surgeon: Jinny Carmine, MD;  Location: [facility] ENDOSCOPY;  Service: Endoscopy;;   REDUCTION MAMMAPLASTY Bilateral 2004   TONSILLECTOMY     TUBAL LIGATION     ULNAR NERVE TRANSPOSITION Left 06/08/2016   Procedure: SUBCUTANEOUS ANTERIOR ULNAR NERVE TRANSPOSITION LEFT ELBOW;  Surgeon: Norleen JINNY Maltos, MD;  Location[facility];  Service: Orthopedics;  Laterality: Left;    Medical History: Past Medical History:  Diagnosis Date   Abnormal posture 05/12/2023   Complication of anesthesia    Contusion of left elbow 06/16/2023   Headache    migraines, 1x/mo   Hyperlipidemia    Hypertension    Low back pain 07/05/2022   Low back strain 07/05/2022   Motion sickness    back seat cars   Neuropathy    Pain of left hip joint 05/12/2023   PONV (postoperative nausea and vomiting)    Sacroiliac joint pain 07/05/2022   Sprain of left wrist 06/16/2023   Tachycardia    Wears contact lenses     Family History: Family History  Problem Relation Age of Onset   Hypertension Mother    Hyperlipidemia Mother    Arrhythmia Mother    Heart attack Maternal Grandfather    Hyperlipidemia Maternal Grandfather    COPD Maternal Grandfather    Breast cancer Paternal Grandmother     Social History   Socioeconomic History   Marital status: Married    Spouse name: Not on file   Number of children: Not on file   Years of education: Not on file   Highest education level: Not on file  Occupational History   Not on file  Tobacco Use   Smoking status: Never   Smokeless tobacco: Never  Vaping Use   Vaping status: Never Used  Substance and Sexual Activity   Alcohol use: Yes    Alcohol/week: 1.0 standard drink of alcohol    Types: 1 Glasses of wine per week    Comment: occasionally   Drug use: No   Sexual activity: Not on file  Other Topics Concern   Not on file  Social History Narrative   Not on file   Social Drivers of Health   Financial Resource Strain: Not on file  Food Insecurity: Not on file  Transportation Needs: Not on file  Physical Activity: Not on file  Stress: Not on file  Social Connections: Not on file  Intimate Partner Violence: Not on file      Review of Systems  Constitutional:  Positive for appetite change and unexpected weight change. Negative for chills and fatigue.  HENT:   Negative for congestion, postnasal drip, rhinorrhea, sneezing and sore throat.   Respiratory: Negative.  Negative for cough, chest tightness, shortness of breath and wheezing.   Cardiovascular: Negative.  Negative for chest pain and palpitations.  Gastrointestinal:  Positive for diarrhea. Negative for abdominal pain, constipation, nausea and vomiting.  Genitourinary:  Negative for dysuria and frequency.  Musculoskeletal:  Negative for arthralgias, back pain, joint swelling and neck pain.  Skin:  Negative for rash.  Neurological: Negative.   Hematological:  Negative for adenopathy. Does not bruise/bleed easily.  Psychiatric/Behavioral:  Positive for sleep disturbance. Negative for behavioral problems (Depression) and suicidal ideas. The patient is not nervous/anxious.     Vital  Signs: BP 100/74   Pulse 92   Temp 98.1 F (36.7 C)   Resp 16   Ht 5' 3 (1.6 m)   Wt 159 lb (72.1 kg) Comment: at home  SpO2 94%   BMI 28.17 kg/m    Physical Exam Vitals reviewed.  Constitutional:      General: She is not in acute distress.    Appearance: Normal appearance. She is obese. She is not ill-appearing.  HENT:     Head: Normocephalic and atraumatic.  Eyes:     Pupils: Pupils are equal, round, and reactive to light.  Cardiovascular:     Rate and Rhythm: Normal rate and regular rhythm.  Pulmonary:     Effort: Pulmonary effort is normal. No respiratory distress.  Skin:    Capillary Refill: Capillary refill takes less than 2 seconds.  Neurological:     Mental Status: She is alert and oriented to person, place, and time.  Psychiatric:        Mood and Affect: Mood normal.        Behavior: Behavior normal.        Assessment/Plan: 1. Vasomotor symptoms due to menopause (Primary) Continue venlafaxine  as prescribed.  - venlafaxine  XR (EFFEXOR -XR) 75 MG 24 hr capsule; Take 1 capsule (75 mg total) by mouth daily with breakfast.  Dispense: 90 capsule; Refill: 3  2. Hot flashes Gabapentin   dose increased.  - gabapentin  (NEURONTIN ) 300 MG capsule; TAKE 1 CAPSULE BY MOUTH EVERY MORNING, then take 1 capsule at lunch AND 3 CAPSULES EVERY NIGHT AT BEDTIME  Dispense: 150 capsule; Refill: 5  3. Migraine without aura and without status migrainosus, not intractable Continue venlafaxine  as prescribed.  - venlafaxine  XR (EFFEXOR -XR) 75 MG 24 hr capsule; Take 1 capsule (75 mg total) by mouth daily with breakfast.  Dispense: 90 capsule; Refill: 3  4. Chronic pain of left wrist Gabapentin  dose increased, follow up in 3 months  - gabapentin  (NEURONTIN ) 300 MG capsule; TAKE 1 CAPSULE BY MOUTH EVERY MORNING, then take 1 capsule at lunch AND 3 CAPSULES EVERY NIGHT AT BEDTIME  Dispense: 150 capsule; Refill: 5  5. Encounter for screening mammogram for malignant neoplasm of breast Routine mammogram ordered  - MM 3D SCREENING MAMMOGRAM BILATERAL BREAST; Future   General Counseling: Yvette Humphrey understanding of the findings of todays visit and agrees with plan of treatment. I have discussed any further diagnostic evaluation that may be needed or ordered today. We also reviewed her medications today. she has been encouraged to call the office with any questions or concerns that should arise related to todays visit.    Orders Placed This Encounter  Procedures   MM 3D SCREENING MAMMOGRAM BILATERAL BREAST    Meds ordered this encounter  Medications   gabapentin  (NEURONTIN ) 300 MG capsule    Sig: TAKE 1 CAPSULE BY MOUTH EVERY MORNING, then take 1 capsule at lunch AND 3 CAPSULES EVERY NIGHT AT BEDTIME    Dispense:  150 capsule    Refill:  5    Note change in dose and number of capsules, please fill new script today.   venlafaxine  XR (EFFEXOR -XR) 75 MG 24 hr capsule    Sig: Take 1 capsule (75 mg total) by mouth daily with breakfast.    Dispense:  90 capsule    Refill:  3    Change to a 90 day supply for next fill thanks    Return for previously scheduled, CPE, Yvette Humphrey PCP in march and  otherwise as needed .  Total time spent:30 Minutes Time spent includes review of chart, medications, test results, and follow up plan with the patient.   [place] Controlled Substance Database was reviewed by me.  This patient was seen by Mardy Maxin, FNP-C in collaboration with Dr. Sigrid Bathe as a part of collaborative care agreement.   Yasmina [place] R. Maxin, MSN, FNP-C Internal medicine

## 2024-04-15 ENCOUNTER — Encounter: Payer: Self-pay | Admitting: Nurse Practitioner

## 2024-05-17 ENCOUNTER — Ambulatory Visit
Admission: RE | Admit: 2024-05-17 | Discharge: 2024-05-17 | Disposition: A | Discharge status: Home / Self Care | Source: Ambulatory Visit | Attending: Nurse Practitioner | Admitting: Nurse Practitioner

## 2024-05-17 DIAGNOSIS — Z1231 Encounter for screening mammogram for malignant neoplasm of breast: Secondary | ICD-10-CM | POA: Insufficient documentation

## 2024-06-03 ENCOUNTER — Telehealth: Payer: Self-pay | Admitting: Cardiovascular Disease

## 2024-06-03 ENCOUNTER — Other Ambulatory Visit (HOSPITAL_COMMUNITY): Payer: Self-pay

## 2024-06-03 DIAGNOSIS — R002 Palpitations: Secondary | ICD-10-CM

## 2024-06-03 DIAGNOSIS — I10 Essential (primary) hypertension: Secondary | ICD-10-CM

## 2024-06-03 MED ORDER — METOPROLOL SUCCINATE ER 50 MG PO TB24
ORAL_TABLET | ORAL | 3 refills | Status: DC
  Filled 2024-06-03: qty 135, 90d supply, fill #0

## 2024-06-03 NOTE — Telephone Encounter (Signed)
 Patient was informed of the recommendations and reported that her systolic blood pressure at home typically runs in the 110s. Cadence was notified and recommended increasing Metoprolol  to 25 mg in the morning and 50 mg in the evening.  Orders have been placed for lab work and a heart monitor.  Fr[place], Cadence H, PA-C to Harl Heron DEL, RN  [facility] Triage (Selected Message)     06/03/24  2:16 PM Her BP was low on the last visit, so unsure we can increase Toprol  at this time. We can check a 2 week heart monitor for palpitations. We can also check CBC, CMET, Mag and TSH.  IF BP has been higher at home she can Toprol  25mg  in the AM (continue 50mg  in the PM) to see if this helps. May need to send in new script.

## 2024-06-03 NOTE — Telephone Encounter (Signed)
 Patient c/o Palpitations:  STAT if patient reporting lightheadedness, shortness of breath, or chest pain  How long have you had palpitations/irregular HR/ Afib? Are you having the symptoms now? About a week  Are you currently experiencing lightheadedness, SOB or CP? No   Do you have a history of afib (atrial fibrillation) or irregular heart rhythm? Yes   Have you checked your BP or HR? (document readings if available): On watch   Are you experiencing any other symptoms? No   Pt states she has been having a fluttering chest that makes her cough at night. It usually does not continue into the day, but today fluttering had persisted. Please advise.

## 2024-06-03 NOTE — Telephone Encounter (Signed)
 Returned pt's call; verified caller using 2 identifiers; pt's is experiencing more frequent PVCs during the night and today palpitations has continued.  Pt stated when she first got up she experienced being lightheaded.  She requested an appointment to see [name], PA-C; appointment was made for 11/4 @ 310 pm.  I explained to the patient that I would reach out to Cadence Further regarding possibly increasing her Metoprolol  to help reduce her PVCs.  Pt states that her blood pressure has been running lower than normal:  today's blood pressures were:  111/74 and 113/67.

## 2024-06-04 ENCOUNTER — Other Ambulatory Visit: Payer: Self-pay

## 2024-06-04 ENCOUNTER — Other Ambulatory Visit (HOSPITAL_COMMUNITY): Payer: Self-pay

## 2024-06-04 ENCOUNTER — Ambulatory Visit: Attending: Medical

## 2024-06-04 DIAGNOSIS — R002 Palpitations: Secondary | ICD-10-CM

## 2024-06-04 DIAGNOSIS — I10 Essential (primary) hypertension: Secondary | ICD-10-CM

## 2024-06-04 MED ORDER — METOPROLOL SUCCINATE ER 50 MG PO TB24: ORAL_TABLET | ORAL | 3 refills | Status: DC

## 2024-06-04 NOTE — Progress Notes (Unsigned)
Enrolled for [employer] to mail a ZIO XT long term holter monitor to the patients address on file.   Dr. Fletcher Anon to read.

## 2024-06-05 ENCOUNTER — Ambulatory Visit: Payer: Self-pay | Admitting: Medical

## 2024-06-05 LAB — CBC
Hematocrit: 37.9 % (ref 34.0–46.6)
Hemoglobin: 12.6 g/dL (ref 11.1–15.9)
MCH: 30.7 pg (ref 26.6–33.0)
MCHC: 33.2 g/dL (ref 31.5–35.7)
MCV: 92 fL (ref 79–97)
Platelets: 283 x10E3/uL (ref 150–450)
RBC: 4.1 x10E6/uL (ref 3.77–5.28)
RDW: 11.6 % — ABNORMAL LOW (ref 11.7–15.4)
WBC: 7.3 x10E3/uL (ref 3.4–10.8)

## 2024-06-05 LAB — COMPREHENSIVE METABOLIC PANEL WITH GFR
ALT: 27 IU/L (ref 0–32)
AST: 20 IU/L (ref 0–40)
Albumin: 4.7 g/dL (ref 3.9–4.9)
Alkaline Phosphatase: 62 IU/L (ref 41–116)
BUN/Creatinine Ratio: 16 (ref 9–23)
BUN: 14 mg/dL (ref 6–24)
Bilirubin Total: 0.2 mg/dL (ref 0.0–1.2)
CO2: 22 mmol/L (ref 20–29)
Calcium: 9.7 mg/dL (ref 8.7–10.2)
Chloride: 100 mmol/L (ref 96–106)
Creatinine, Ser: 0.85 mg/dL (ref 0.57–1.00)
Globulin, Total: 2.4 g/dL (ref 1.5–4.5)
Glucose: 92 mg/dL (ref 70–99)
Potassium: 4 mmol/L (ref 3.5–5.2)
Sodium: 140 mmol/L (ref 134–144)
Total Protein: 7.1 g/dL (ref 6.0–8.5)
eGFR: 83 mL/min/1.73 (ref >59)

## 2024-06-05 LAB — MAGNESIUM: Magnesium: 2.4 mg/dL — AB (ref 1.6–2.3)

## 2024-06-05 LAB — TSH: TSH: 0.92 u[IU]/mL (ref 0.450–4.500)

## 2024-06-11 ENCOUNTER — Ambulatory Visit: Attending: Medical | Admitting: Medical

## 2024-06-11 ENCOUNTER — Encounter: Payer: Self-pay | Admitting: Medical

## 2024-06-11 VITALS — BP 120/80 | HR 89 | Ht 63.0 in | Wt 165.8 lb

## 2024-06-11 DIAGNOSIS — I471 Supraventricular tachycardia, unspecified: Secondary | ICD-10-CM | POA: Diagnosis not present

## 2024-06-11 DIAGNOSIS — I10 Essential (primary) hypertension: Secondary | ICD-10-CM

## 2024-06-11 MED ORDER — METOPROLOL SUCCINATE ER 50 MG PO TB24: 50.0000 mg | ORAL_TABLET | Freq: Two times a day (BID) | ORAL | 3 refills | Status: DC

## 2024-06-11 NOTE — Progress Notes (Signed)
 Cardiology Office Note   Date:  06/11/2024  ID:  MEREDYTH HORNUNG, DOB [birth year], MRN [id number] PCP: Liana Fish, NP  [facility] Providers Cardiologist:  [name], MD   History of Present Illness Yvette Humphrey is a 48 y.o. female with a h/o palpitations and pSVT who presents for follow-up of palpitations.   She has a long history of palpitations on metoprolol . Heart monitor 08/2022 showed NSR, 6 short runs of SVT.   The patient was last seen 03/15/24 and was overall stable from a cardiac perspective.   She called in reporting palpitations and toprol  was increased to 25mg  in the AM and 50mg  in the PM.  Heart monitor was ordered.  Today, the patient reports she is still having palpitations despite increase of Toprol . She is still experiencing palpitations daily.  She denies SOB or chest pain. She does have some lightheadedness and dizziness in the morning. She applied the heart monitor yesterday.   Studies Reviewed EKG Interpretation Date/Time:  Tuesday June 11 2024 15:29:27 EST Ventricular Rate:  91 PR Interval:  158 QRS Duration:  80 QT Interval:  352 QTC Calculation: 432 R Axis:   -12  Text Interpretation: Normal sinus rhythm Minimal voltage criteria for LVH, may be normal variant ( R in aVL ) Cannot rule out Anterior infarct (cited on or before 15-Mar-2024) When compared with ECG of 15-Mar-2024 13:30, No significant change was found Confirmed by Franchester, Nakyiah Kuck ([id number]) on 06/11/2024 3:37:13 PM    Heart monitor 09/2022 Patch Wear Time:  13 days and 23 hours (2024-01-19T10:28:51-0500 to 2024-02-02T09:34:07-499)   Patient had a min HR of 63 bpm, max HR of 164 bpm, and avg HR of 89 bpm. Predominant underlying rhythm was Sinus Rhythm. 6 Supraventricular Tachycardia runs occurred, the run with the fastest interval lasting 6 beats with a max rate of 164 bpm, the  longest lasting 14 beats with an avg rate of 145 bpm. Supraventricular Tachycardia was detected within  +/- 45 seconds of symptomatic patient event(s). Isolated SVEs were rare (<1.0%), SVE Couplets were rare (<1.0%), and SVE Triplets were rare (<1.0%).  Isolated VEs were rare (<1.0%), VE Couplets were rare (<1.0%), and no VE Triplets were present.   Physical Exam VS:  BP 120/80 (BP Location: Left Arm, Patient Position: Sitting, Cuff Size: Normal)   Pulse 89   Ht 5' 3 (1.6 m)   Wt 165 lb 12.8 oz (75.2 kg)   SpO2 98%   BMI 29.37 kg/m        Wt Readings from Last 3 Encounters:  06/11/24 165 lb 12.8 oz (75.2 kg)  04/11/24 159 lb (72.1 kg)  03/15/24 163 lb 3.2 oz (74 kg)    GEN: Well nourished, well developed in no acute distress NECK: No JVD; No carotid bruits CARDIAC: RRR, no murmurs, rubs, gallops RESPIRATORY:  Clear to auscultation without rales, wheezing or rhonchi  ABDOMEN: Soft, non-tender, non-distended EXTREMITIES:  No edema; No deformity   ASSESSMENT AND PLAN  pSVT Patient just applied the 2-week heart monitor yesterday.  She called in reporting persistent palpitations and Toprol  was increased to 25 mg in the morning and 50 mg at night.  She does not feel symptoms have improved.  EKG shows normal sinus rhythm with a heart rate of 91 bpm.  I will increase Toprol  to 50 mg in the morning and 50 mg at night.  I will refer patient to EP for SVT.  We will await heart monitor results.  Hyperlipidemia LDL 44.  Continue Crestor  5 mg daily.       Dispo: Follow-up in 6 months with general cards  Signed, Mirca Yale VEAR Fishman, PA-C

## 2024-06-11 NOTE — Patient Instructions (Signed)
 Medication Instructions:   Your physician recommends the following medication changes.  INCREASE:  Metoprolol  Succinate (Toprol  - XL) 50 mg 2 times daily  *If you need a refill on your cardiac medications before your next appointment, please call your pharmacy*  Lab Work:  None ordered at this time   If you have labs (blood work) drawn today and your tests are completely normal, you will receive your results only by:  MyChart Message (if you have MyChart) OR  A paper copy in the mail If you have any lab test that is abnormal or we need to change your treatment, we will call you to review the results.  Testing/Procedures:  None ordered at this time   Referrals:  Your cardiologist has referred you to Cardiology Electrophysiology  We have attached their office location and phone number below.  Please allow them 3-5 business days to reach out to you to make an appointment.  If you have not heard from their office within that time, please call them to schedule your appointment.    Follow-Up:  At [facility], you and your health needs are our priority.  As part of our continuing mission to provide you with exceptional heart care, our providers are all part of one team.  This team includes your primary Cardiologist (physician) and Advanced Practice Providers or APPs (Physician Assistants and Nurse Practitioners) who all work together to provide you with the care you need, when you need it.  Your next appointment:   6 month(s)  Provider:    [name], MD or Yvette Humphrey, [place]    We recommend signing up for the patient portal called MyChart.  Sign up information is provided on this After Visit Summary.  MyChart is used to connect with patients for Virtual Visits (Telemedicine).  Patients are able to view lab/test results, encounter notes, upcoming appointments, etc.  Non-urgent messages can be sent to your provider as well.   To learn more about what you can do with  MyChart, go to [email or web address].

## 2024-06-26 ENCOUNTER — Encounter: Payer: 59 | Admitting: Nurse Practitioner

## 2024-06-30 ENCOUNTER — Other Ambulatory Visit: Payer: Self-pay | Admitting: Nurse Practitioner

## 2024-06-30 DIAGNOSIS — R112 Nausea with vomiting, unspecified: Secondary | ICD-10-CM

## 2024-07-05 DIAGNOSIS — R002 Palpitations: Secondary | ICD-10-CM | POA: Diagnosis not present

## 2024-07-09 ENCOUNTER — Ambulatory Visit (INDEPENDENT_AMBULATORY_CARE_PROVIDER_SITE_OTHER): Admitting: Nurse Practitioner

## 2024-07-09 ENCOUNTER — Encounter: Payer: Self-pay | Admitting: Nurse Practitioner

## 2024-07-09 VITALS — BP 110/76 | HR 100 | Temp 96.8°F | Resp 16 | Ht 63.0 in | Wt 157.0 lb

## 2024-07-09 DIAGNOSIS — E538 Deficiency of other specified B group vitamins: Secondary | ICD-10-CM | POA: Diagnosis not present

## 2024-07-09 DIAGNOSIS — R112 Nausea with vomiting, unspecified: Secondary | ICD-10-CM

## 2024-07-09 DIAGNOSIS — Z0001 Encounter for general adult medical examination with abnormal findings: Secondary | ICD-10-CM | POA: Diagnosis not present

## 2024-07-09 DIAGNOSIS — N951 Menopausal and female climacteric states: Secondary | ICD-10-CM

## 2024-07-09 DIAGNOSIS — T50905A Adverse effect of unspecified drugs, medicaments and biological substances, initial encounter: Secondary | ICD-10-CM

## 2024-07-09 DIAGNOSIS — F411 Generalized anxiety disorder: Secondary | ICD-10-CM

## 2024-07-09 DIAGNOSIS — E559 Vitamin D deficiency, unspecified: Secondary | ICD-10-CM

## 2024-07-09 DIAGNOSIS — I471 Supraventricular tachycardia, unspecified: Secondary | ICD-10-CM | POA: Insufficient documentation

## 2024-07-09 DIAGNOSIS — R7989 Other specified abnormal findings of blood chemistry: Secondary | ICD-10-CM | POA: Diagnosis not present

## 2024-07-09 DIAGNOSIS — E782 Mixed hyperlipidemia: Secondary | ICD-10-CM

## 2024-07-09 DIAGNOSIS — M064 Inflammatory polyarthropathy: Secondary | ICD-10-CM

## 2024-07-09 DIAGNOSIS — G43009 Migraine without aura, not intractable, without status migrainosus: Secondary | ICD-10-CM

## 2024-07-09 MED ORDER — TRAZODONE HCL 100 MG PO TABS: ORAL_TABLET | ORAL | 1 refills | Status: AC

## 2024-07-09 MED ORDER — UBRELVY 100 MG PO TABS: ORAL_TABLET | ORAL | 3 refills | Status: AC

## 2024-07-09 MED ORDER — OMEPRAZOLE 20 MG PO CPDR: 20.0000 mg | DELAYED_RELEASE_CAPSULE | Freq: Every day | ORAL | 3 refills | Status: AC

## 2024-07-09 MED ORDER — DICLOFENAC SODIUM 75 MG PO TBEC: 75.0000 mg | DELAYED_RELEASE_TABLET | Freq: Two times a day (BID) | ORAL | 2 refills | Status: AC

## 2024-07-09 MED ORDER — TRAMADOL HCL 50 MG PO TABS: 50.0000 mg | ORAL_TABLET | Freq: Four times a day (QID) | ORAL | 1 refills | Status: AC | PRN

## 2024-07-09 MED ORDER — ONDANSETRON HCL 4 MG PO TABS: ORAL_TABLET | ORAL | 2 refills | Status: DC

## 2024-07-09 MED ORDER — ALPRAZOLAM 0.5 MG PO TABS: ORAL_TABLET | ORAL | 2 refills | Status: AC

## 2024-07-09 NOTE — Progress Notes (Signed)
 [facility] [address] [place], [place] [address]  Internal MEDICINE  Office Visit Note  Patient Name: Yvette Humphrey  [id number]  [id number]  Date of Service: 07/09/2024  Chief Complaint  Patient presents with   Hyperlipidemia   Hypertension   Annual Exam    HPI Yvette Humphrey presents for an annual well visit and physical exam.  Well-appearing 48 y.o. female with hypertension, migraines, inflammatory polyarthropathy, GAD, insomnia, depression, and IBS-D  Routine CRC screening: due for colonoscopy in 2034, last done in 2024 Routine mammogram: done in October this year  DEXA scan: not due yet  Pap smear: due now, will plan at a follow up visit.  Labs: routine labs due now  New or worsening pain: chronic pain, sees Dr. Rankin Rogue at [facility] Other concerns: none  Has IBS-D and was started on cholestyramine  by GI  Hot flashes are getting worse despite being on venlafaxine  and gabapentin .    Current Medication: Outpatient Encounter Medications as of 07/09/2024  Medication Sig   ALPRAZolam  (XANAX ) 0.5 MG tablet TAKE 1 TABLET(0.5 MG) BY MOUTH DAILY AS NEEDED FOR ANXIETY   cholestyramine  (QUESTRAN ) 4 g packet Take 1 packet (4 g total) by mouth daily. (Patient not taking: Reported on 06/11/2024)   cyanocobalamin 500 MCG tablet Take 500 mcg by mouth daily.   diclofenac  (VOLTAREN ) 75 MG EC tablet Take 1 tablet (75 mg total) by mouth 2 (two) times daily.   dicyclomine  (BENTYL ) 10 MG capsule Take 1 capsule (10 mg total) by mouth 3 (three) times daily before meals. (Patient not taking: Reported on 06/11/2024)   diphenoxylate -atropine  (LOMOTIL ) 2.5-0.025 MG tablet Take 1 tablet by mouth 4 (four) times daily.   fluconazole  (DIFLUCAN ) 150 MG tablet Take 1 tab po  once repeat in 3 days If symptoms persists   fluticasone  (FLONASE ) 50 MCG/ACT nasal spray Place 1 spray into both nostrils daily.   gabapentin  (NEURONTIN ) 300 MG capsule TAKE 1 CAPSULE BY MOUTH EVERY MORNING, then take 1  capsule at lunch AND 3 CAPSULES EVERY NIGHT AT BEDTIME   metoprolol  succinate (TOPROL -XL) 50 MG 24 hr tablet Take 1 tablet (50 mg total) by mouth in the morning and at bedtime. Take with or immediately following a meal.   montelukast  (SINGULAIR ) 10 MG tablet TAKE 1 TABLET(10 MG) BY MOUTH AT BEDTIME   omeprazole  (PRILOSEC) 20 MG capsule Take 1 capsule (20 mg total) by mouth daily.   ondansetron  (ZOFRAN ) 4 MG tablet TAKE 1 TABLET(4 MG) BY MOUTH EVERY 8 HOURS AS NEEDED FOR NAUSEA OR VOMITING   rosuvastatin  (CRESTOR ) 5 MG tablet Take 1 tablet (5 mg total) by mouth daily.   traMADol  (ULTRAM ) 50 MG tablet Take 1 tablet (50 mg total) by mouth every 6 (six) hours as needed for severe pain (pain score 7-10).   traZODone  (DESYREL ) 100 MG tablet TAKE 1 TO 2 TABLETS(100 TO 200 MG) BY MOUTH AT BEDTIME   Ubrogepant  (UBRELVY ) 100 MG TABS TAKE 1 TABLET(100 MG) BY MOUTH DAILY AS NEEDED FOR ACUTE MIGRAINE   venlafaxine  XR (EFFEXOR -XR) 75 MG 24 hr capsule Take 1 capsule (75 mg total) by mouth daily with breakfast.   [DISCONTINUED] ALPRAZolam  (XANAX ) 0.5 MG tablet TAKE 1 TABLET(0.5 MG) BY MOUTH DAILY AS NEEDED FOR ANXIETY   [DISCONTINUED] diclofenac  (VOLTAREN ) 75 MG EC tablet Take 1 tablet (75 mg total) by mouth 2 (two) times daily.   [DISCONTINUED] omeprazole  (PRILOSEC) 20 MG capsule Take 1 capsule (20 mg total) by mouth daily.   [DISCONTINUED] ondansetron  (ZOFRAN )  4 MG tablet TAKE 1 TABLET(4 MG) BY MOUTH EVERY 8 HOURS AS NEEDED FOR NAUSEA OR VOMITING   [DISCONTINUED] traMADol  (ULTRAM ) 50 MG tablet Take 1 tablet (50 mg total) by mouth every 6 (six) hours as needed for severe pain (pain score 7-10).   [DISCONTINUED] traZODone  (DESYREL ) 100 MG tablet TAKE 1 TO 2 TABLETS(100 TO 200 MG) BY MOUTH AT BEDTIME   [DISCONTINUED] Ubrogepant  (UBRELVY ) 100 MG TABS TAKE 1 TABLET(100 MG) BY MOUTH DAILY AS NEEDED FOR ACUTE MIGRAINE   No facility-administered encounter medications on file as of 07/09/2024.    Surgical  History: Past Surgical History:  Procedure Laterality Date   CHOLECYSTECTOMY     COLONOSCOPY WITH PROPOFOL  N/A 06/01/2023   Procedure: COLONOSCOPY WITH PROPOFOL ;  Surgeon: Jinny Carmine, MD;  Location: [facility];  Service: Endoscopy;  Laterality: N/A;   ENDOMETRIAL ABLATION     approx 2009   ESOPHAGOGASTRODUODENOSCOPY     POLYPECTOMY  06/01/2023   Procedure: POLYPECTOMY;  Surgeon: Jinny Carmine, MD;  Location: [facility];  Service: Endoscopy;;   REDUCTION MAMMAPLASTY Bilateral 2004   TONSILLECTOMY     TUBAL LIGATION     ULNAR NERVE TRANSPOSITION Left 06/08/2016   Procedure: SUBCUTANEOUS ANTERIOR ULNAR NERVE TRANSPOSITION LEFT ELBOW;  Surgeon: Norleen JINNY Maltos, MD;  Location: [facility];  Service: Orthopedics;  Laterality: Left;    Medical History: Past Medical History:  Diagnosis Date   Abnormal posture 05/12/2023   Complication of anesthesia    Contusion of left elbow 06/16/2023   Headache    migraines, 1x/mo   Hyperlipidemia    Hypertension    Low back pain 07/05/2022   Low back strain 07/05/2022   Motion sickness    back seat cars   Neuropathy    Pain of left hip joint 05/12/2023   PONV (postoperative nausea and vomiting)    Sacroiliac joint pain 07/05/2022   Sprain of left wrist 06/16/2023   Tachycardia    Wears contact lenses     Family History: Family History  Problem Relation Age of Onset   Hypertension Mother    Hyperlipidemia Mother    Arrhythmia Mother    Heart attack Maternal Grandfather    Hyperlipidemia Maternal Grandfather    COPD Maternal Grandfather    Breast cancer Paternal Grandmother     Social History   Socioeconomic History   Marital status: Married    Spouse name: Not on file   Number of children: Not on file   Years of education: Not on file   Highest education level: Not on file  Occupational History   Not on file  Tobacco Use   Smoking status: Never   Smokeless tobacco: Never  Vaping Use   Vaping status: Never Used   Substance and Sexual Activity   Alcohol use: Yes    Alcohol/week: 1.0 standard drink of alcohol    Types: 1 Glasses of wine per week    Comment: occasionally   Drug use: No   Sexual activity: Not on file  Other Topics Concern   Not on file  Social History Narrative   Not on file   Social Drivers of Health   Financial Resource Strain: Not on file  Food Insecurity: Not on file  Transportation Needs: Not on file  Physical Activity: Not on file  Stress: Not on file  Social Connections: Not on file  Intimate Partner Violence: Not on file      Review of Systems  Constitutional:  Negative for activity change,  appetite change, chills, fatigue, fever and unexpected weight change.  HENT: Negative.  Negative for congestion, ear pain, rhinorrhea, sore throat and trouble swallowing.   Eyes: Negative.   Respiratory: Negative.  Negative for cough, chest tightness, shortness of breath and wheezing.   Cardiovascular: Negative.  Negative for chest pain.  Gastrointestinal: Negative.  Negative for abdominal pain, blood in stool, constipation, diarrhea, nausea and vomiting.  Endocrine: Negative.   Genitourinary: Negative.  Negative for difficulty urinating, dysuria, frequency, hematuria and urgency.  Musculoskeletal: Negative.  Negative for arthralgias, back pain, joint swelling, myalgias and neck pain.  Skin: Negative.  Negative for rash and wound.  Allergic/Immunologic: Negative.  Negative for immunocompromised state.  Neurological: Negative.  Negative for dizziness, seizures, numbness and headaches.  Hematological: Negative.   Psychiatric/Behavioral: Negative.  Negative for behavioral problems, self-injury and suicidal ideas. The patient is not nervous/anxious.     Vital Signs: BP 110/76   Pulse 100   Temp (!) 96.8 F (36 C)   Resp 16   Ht 5' 3 (1.6 m)   Wt 157 lb (71.2 kg) Comment: at home  SpO2 94%   BMI 27.81 kg/m    Physical Exam Vitals reviewed.  Constitutional:       General: She is awake. She is not in acute distress.    Appearance: Normal appearance. She is well-developed and well-groomed. She is obese. She is not ill-appearing or diaphoretic.  HENT:     Head: Normocephalic and atraumatic.     Right Ear: Tympanic membrane, ear canal and external ear normal.     Left Ear: Tympanic membrane, ear canal and external ear normal.     Nose: Nose normal. No congestion or rhinorrhea.     Mouth/Throat:     Lips: Pink.     Mouth: Mucous membranes are moist.     Pharynx: Oropharynx is clear. Uvula midline. No oropharyngeal exudate or posterior oropharyngeal erythema.  Eyes:     General: Lids are normal. Vision grossly intact. Gaze aligned appropriately. No scleral icterus.       Right eye: No discharge.        Left eye: No discharge.     Extraocular Movements: Extraocular movements intact.     Conjunctiva/sclera: Conjunctivae normal.     Pupils: Pupils are equal, round, and reactive to light.     Funduscopic exam:    Right eye: Red reflex present.        Left eye: Red reflex present. Neck:     Thyroid: No thyromegaly.     Vascular: No carotid bruit or JVD.     Trachea: Trachea and phonation normal. No tracheal deviation.  Cardiovascular:     Rate and Rhythm: Normal rate and regular rhythm.     Pulses: Normal pulses.     Heart sounds: Normal heart sounds, S1 normal and S2 normal. No murmur heard.    No friction rub. No gallop.  Pulmonary:     Effort: Pulmonary effort is normal. No accessory muscle usage or respiratory distress.     Breath sounds: Normal breath sounds and air entry. No stridor. No wheezing or rales.  Chest:     Chest wall: No tenderness.     Comments: Declined clinical breast exam, gets annual mammograms.  Abdominal:     General: Bowel sounds are normal. There is no distension.     Palpations: Abdomen is soft. There is no shifting dullness, fluid wave, mass or pulsatile mass.     Tenderness: There is no  abdominal tenderness. There is  no guarding or rebound.  Musculoskeletal:        General: No tenderness or deformity. Normal range of motion.     Cervical back: Normal range of motion and neck supple.  Lymphadenopathy:     Cervical: No cervical adenopathy.  Skin:    General: Skin is warm and dry.     Capillary Refill: Capillary refill takes less than 2 seconds.     Coloration: Skin is not pale.     Findings: No erythema or rash.  Neurological:     Mental Status: She is alert and oriented to person, place, and time.     Cranial Nerves: No cranial nerve deficit.     Motor: No abnormal muscle tone.     Coordination: Coordination normal.     Gait: Gait normal.     Deep Tendon Reflexes: Reflexes are normal and symmetric.  Psychiatric:        Mood and Affect: Mood and affect normal.        Behavior: Behavior normal. Behavior is cooperative.        Thought Content: Thought content normal.        Judgment: Judgment normal.        Assessment/Plan: 1. Encounter for routine adult health examination with abnormal findings (Primary) Age-appropriate preventive screenings and vaccinations discussed, annual physical exam completed. Routine labs for health maintenance ordered, see below. PHM updated.   - Iron, TIBC and Ferritin Panel - B12 and Folate Panel - Lipid Profile - Vitamin D  (25 hydroxy) - omeprazole  (PRILOSEC) 20 MG capsule; Take 1 capsule (20 mg total) by mouth daily.  Dispense: 90 capsule; Refill: 3 - traZODone  (DESYREL ) 100 MG tablet; TAKE 1 TO 2 TABLETS(100 TO 200 MG) BY MOUTH AT BEDTIME  Dispense: 180 tablet; Refill: 1  2. SVT (supraventricular tachycardia) Continue medication as prescribed by cardiology and continue follow up with cardiology   3. Elevated ferritin Routine labs ordered  - Iron, TIBC and Ferritin Panel - B12 and Folate Panel  4. Mixed hyperlipidemia Routine labs ordered  - Lipid Profile  5. B12 deficiency Routine labs ordered  - Iron, TIBC and Ferritin Panel - B12 and Folate  Panel  6. Vitamin D  deficiency Routine lab ordered  - Vitamin D  (25 hydroxy)  7. Inflammatory polyarthropathy of multiple sites [facility]) Continue medications as prescribed.  - diclofenac  (VOLTAREN ) 75 MG EC tablet; Take 1 tablet (75 mg total) by mouth 2 (two) times daily.  Dispense: 60 tablet; Refill: 2 - traMADol  (ULTRAM ) 50 MG tablet; Take 1 tablet (50 mg total) by mouth every 6 (six) hours as needed for severe pain (pain score 7-10).  Dispense: 60 tablet; Refill: 1  8. Migraine without aura and without status migrainosus, not intractable Continue ubrelvy  as prescribed for acute migraine  - Ubrogepant  (UBRELVY ) 100 MG TABS; TAKE 1 TABLET(100 MG) BY MOUTH DAILY AS NEEDED FOR ACUTE MIGRAINE  Dispense: 16 tablet; Refill: 3  9. Drug-induced nausea and vomiting Continue prn zofran  as prescribed.  - ondansetron  (ZOFRAN ) 4 MG tablet; TAKE 1 TABLET(4 MG) BY MOUTH EVERY 8 HOURS AS NEEDED FOR NAUSEA OR VOMITING  Dispense: 18 tablet; Refill: 2  10. Vasomotor symptoms due to menopause Routine labs ordered, can discuss HRT at next visit.  - FSH/LH - Estradiol   11. GAD (generalized anxiety disorder) Continue prn alprazolam  as prescribed.  - ALPRAZolam  (XANAX ) 0.5 MG tablet; TAKE 1 TABLET(0.5 MG) BY MOUTH DAILY AS NEEDED FOR ANXIETY  Dispense: 30 tablet; Refill:  2     General Counseling: stasia somero understanding of the findings of todays visit and agrees with plan of treatment. I have discussed any further diagnostic evaluation that may be needed or ordered today. We also reviewed her medications today. she has been encouraged to call the office with any questions or concerns that should arise related to todays visit.    Orders Placed This Encounter  Procedures   Iron, TIBC and Ferritin Panel   B12 and Folate Panel   Lipid Profile   Vitamin D  (25 hydroxy)   FSH/LH   Estradiol     Meds ordered this encounter  Medications   ALPRAZolam  (XANAX ) 0.5 MG tablet    Sig: TAKE 1 TABLET(0.5  MG) BY MOUTH DAILY AS NEEDED FOR ANXIETY    Dispense:  30 tablet    Refill:  2   omeprazole  (PRILOSEC) 20 MG capsule    Sig: Take 1 capsule (20 mg total) by mouth daily.    Dispense:  90 capsule    Refill:  3   diclofenac  (VOLTAREN ) 75 MG EC tablet    Sig: Take 1 tablet (75 mg total) by mouth 2 (two) times daily.    Dispense:  60 tablet    Refill:  2   traZODone  (DESYREL ) 100 MG tablet    Sig: TAKE 1 TO 2 TABLETS(100 TO 200 MG) BY MOUTH AT BEDTIME    Dispense:  180 tablet    Refill:  1    For future refills   traMADol  (ULTRAM ) 50 MG tablet    Sig: Take 1 tablet (50 mg total) by mouth every 6 (six) hours as needed for severe pain (pain score 7-10).    Dispense:  60 tablet    Refill:  1   Ubrogepant  (UBRELVY ) 100 MG TABS    Sig: TAKE 1 TABLET(100 MG) BY MOUTH DAILY AS NEEDED FOR ACUTE MIGRAINE    Dispense:  16 tablet    Refill:  3   ondansetron  (ZOFRAN ) 4 MG tablet    Sig: TAKE 1 TABLET(4 MG) BY MOUTH EVERY 8 HOURS AS NEEDED FOR NAUSEA OR VOMITING    Dispense:  18 tablet    Refill:  2    Return in about 5 weeks (around 08/14/2024) for F/U, Labs, [facility] PCP and will need PAP at next visit. .   Total time spent:30 Minutes Time spent includes review of chart, medications, test results, and follow up plan with the patient.   [place] Controlled Substance Database was reviewed by me.  This patient was seen by Mardy Maxin, FNP-C in collaboration with Dr. Sigrid Bathe as a part of collaborative care agreement.  Jilian West R. Maxin, MSN, FNP-C Internal medicine

## 2024-07-19 ENCOUNTER — Ambulatory Visit: Admitting: Cardiology

## 2024-07-20 LAB — LIPID PANEL
Chol/HDL Ratio: 2.8 ratio (ref 0.0–4.4)
Cholesterol, Total: 141 mg/dL (ref 100–199)
HDL: 51 mg/dL (ref >39)
LDL Chol Calc (NIH): 71 mg/dL (ref 0–99)
Triglycerides: 101 mg/dL (ref 0–149)
VLDL Cholesterol Cal: 19 mg/dL (ref 5–40)

## 2024-07-20 LAB — IRON,TIBC AND FERRITIN PANEL
Ferritin: 264 ng/mL — ABNORMAL HIGH (ref 15–150)
Iron Saturation: 32 % (ref 15–55)
Iron: 87 ug/dL (ref 27–159)
Total Iron Binding Capacity: 270 ug/dL (ref 250–450)
UIBC: 183 ug/dL (ref 131–425)

## 2024-07-20 LAB — ESTRADIOL: Estradiol: 11.2 pg/mL

## 2024-07-20 LAB — B12 AND FOLATE PANEL
Folate: 19 ng/mL (ref >3.0)
Vitamin B-12: 725 pg/mL (ref 232–1245)

## 2024-07-20 LAB — FSH/LH
FSH: 37.9 m[IU]/mL
LH: 20.2 m[IU]/mL

## 2024-07-20 LAB — VITAMIN D 25 HYDROXY (VIT D DEFICIENCY, FRACTURES): Vit D, 25-Hydroxy: 65.6 ng/mL (ref 30.0–100.0)

## 2024-08-12 ENCOUNTER — Ambulatory Visit: Payer: Self-pay | Admitting: Nurse Practitioner

## 2024-08-12 NOTE — Progress Notes (Signed)
 The results have been reviewed and will be discussed with the patient at her upcoming office visit.

## 2024-08-14 ENCOUNTER — Ambulatory Visit: Admitting: Nurse Practitioner

## 2024-08-14 ENCOUNTER — Encounter: Payer: Self-pay | Admitting: Nurse Practitioner

## 2024-08-14 VITALS — BP 118/86 | HR 92 | Temp 96.4°F | Resp 16 | Ht 63.0 in | Wt 156.0 lb

## 2024-08-14 DIAGNOSIS — R7989 Other specified abnormal findings of blood chemistry: Secondary | ICD-10-CM

## 2024-08-14 DIAGNOSIS — E663 Overweight: Secondary | ICD-10-CM

## 2024-08-14 DIAGNOSIS — Z6827 Body mass index (BMI) 27.0-27.9, adult: Secondary | ICD-10-CM | POA: Diagnosis not present

## 2024-08-14 DIAGNOSIS — R110 Nausea: Secondary | ICD-10-CM

## 2024-08-14 DIAGNOSIS — M064 Inflammatory polyarthropathy: Secondary | ICD-10-CM

## 2024-08-14 MED ORDER — PROMETHAZINE HCL 12.5 MG PO TABS: 12.5000 mg | ORAL_TABLET | Freq: Three times a day (TID) | ORAL | 5 refills | Status: AC | PRN

## 2024-08-14 NOTE — Progress Notes (Signed)
 [facility] [address] [place], [place] [address]  Internal MEDICINE  Office Visit Note  Patient Name: Yvette Humphrey  [id number]  [id number]  Date of Service: 08/15/2024  Chief Complaint  Patient presents with   Hyperlipidemia   Hypertension   Follow-up    Review labs    HPI Yvette Humphrey presents for a follow-up visit for lab results, weight loss, nausea.  Elevated ferritin -- had bee chronic for years. Was evaluated by hematology about 10 years ago and nothing was found. Autom immune labs were done in 2022 and those were normal. Liver function is normal and renal function is also normal on labs.  Weight loss -- down 10 lbs from November, using compounded GLP1 through a website.  Nausea -- was taking zofran  as needed but this has been causing constipation, would like to try something else.   Current Medication: Outpatient Encounter Medications as of 08/14/2024  Medication Sig   promethazine  (PHENERGAN ) 12.5 MG tablet Take 1 tablet (12.5 mg total) by mouth every 8 (eight) hours as needed for nausea or vomiting.   ALPRAZolam  (XANAX ) 0.5 MG tablet TAKE 1 TABLET(0.5 MG) BY MOUTH DAILY AS NEEDED FOR ANXIETY   cyanocobalamin 500 MCG tablet Take 500 mcg by mouth daily.   diclofenac  (VOLTAREN ) 75 MG EC tablet Take 1 tablet (75 mg total) by mouth 2 (two) times daily.   diphenoxylate -atropine  (LOMOTIL ) 2.5-0.025 MG tablet Take 1 tablet by mouth 4 (four) times daily.   fluconazole  (DIFLUCAN ) 150 MG tablet Take 1 tab po  once repeat in 3 days If symptoms persists   fluticasone  (FLONASE ) 50 MCG/ACT nasal spray Place 1 spray into both nostrils daily.   gabapentin  (NEURONTIN ) 300 MG capsule TAKE 1 CAPSULE BY MOUTH EVERY MORNING, then take 1 capsule at lunch AND 3 CAPSULES EVERY NIGHT AT BEDTIME   metoprolol  succinate (TOPROL -XL) 50 MG 24 hr tablet Take 1 tablet (50 mg total) by mouth in the morning and at bedtime. Take with or immediately following a meal.   montelukast  (SINGULAIR ) 10 MG  tablet TAKE 1 TABLET(10 MG) BY MOUTH AT BEDTIME   omeprazole  (PRILOSEC) 20 MG capsule Take 1 capsule (20 mg total) by mouth daily.   rosuvastatin  (CRESTOR ) 5 MG tablet Take 1 tablet (5 mg total) by mouth daily.   traMADol  (ULTRAM ) 50 MG tablet Take 1 tablet (50 mg total) by mouth every 6 (six) hours as needed for severe pain (pain score 7-10).   traZODone  (DESYREL ) 100 MG tablet TAKE 1 TO 2 TABLETS(100 TO 200 MG) BY MOUTH AT BEDTIME   Ubrogepant  (UBRELVY ) 100 MG TABS TAKE 1 TABLET(100 MG) BY MOUTH DAILY AS NEEDED FOR ACUTE MIGRAINE   venlafaxine  XR (EFFEXOR -XR) 75 MG 24 hr capsule Take 1 capsule (75 mg total) by mouth daily with breakfast.   [DISCONTINUED] cholestyramine  (QUESTRAN ) 4 g packet Take 1 packet (4 g total) by mouth daily. (Patient not taking: Reported on 06/11/2024)   [DISCONTINUED] dicyclomine  (BENTYL ) 10 MG capsule Take 1 capsule (10 mg total) by mouth 3 (three) times daily before meals. (Patient not taking: Reported on 06/11/2024)   [DISCONTINUED] ondansetron  (ZOFRAN ) 4 MG tablet TAKE 1 TABLET(4 MG) BY MOUTH EVERY 8 HOURS AS NEEDED FOR NAUSEA OR VOMITING   No facility-administered encounter medications on file as of 08/14/2024.    Surgical History: Past Surgical History:  Procedure Laterality Date   CHOLECYSTECTOMY     COLONOSCOPY WITH PROPOFOL  N/A 06/01/2023   Procedure: COLONOSCOPY WITH PROPOFOL ;  Surgeon: Yvette Carmine, MD;  Location: [facility];  Service: Endoscopy;  Laterality: N/A;   ENDOMETRIAL ABLATION     approx 2009   ESOPHAGOGASTRODUODENOSCOPY     POLYPECTOMY  06/01/2023   Procedure: POLYPECTOMY;  Surgeon: Yvette Carmine, MD;  Location: [facility];  Service: Endoscopy;;   REDUCTION MAMMAPLASTY Bilateral 2004   TONSILLECTOMY     TUBAL LIGATION     ULNAR NERVE TRANSPOSITION Left 06/08/2016   Procedure: SUBCUTANEOUS ANTERIOR ULNAR NERVE TRANSPOSITION LEFT ELBOW;  Surgeon: Yvette Yvette Maltos, MD;  Location: [facility];  Service: Orthopedics;  Laterality: Left;     Medical History: Past Medical History:  Diagnosis Date   Abnormal posture 05/12/2023   Complication of anesthesia    Contusion of left elbow 06/16/2023   Headache    migraines, 1x/mo   Hyperlipidemia    Hypertension    Low back pain 07/05/2022   Low back strain 07/05/2022   Motion sickness    back seat cars   Neuropathy    Pain of left hip joint 05/12/2023   PONV (postoperative nausea and vomiting)    Sacroiliac joint pain 07/05/2022   Sprain of left wrist 06/16/2023   Tachycardia    Wears contact lenses     Family History: Family History  Problem Relation Age of Onset   Hypertension Mother    Hyperlipidemia Mother    Arrhythmia Mother    Heart attack Maternal Grandfather    Hyperlipidemia Maternal Grandfather    COPD Maternal Grandfather    Breast cancer Paternal Grandmother     Social History   Socioeconomic History   Marital status: Married    Spouse name: Not on file   Number of children: Not on file   Years of education: Not on file   Highest education level: Not on file  Occupational History   Not on file  Tobacco Use   Smoking status: Never   Smokeless tobacco: Never  Vaping Use   Vaping status: Never Used  Substance and Sexual Activity   Alcohol use: Yes    Alcohol/week: 1.0 standard drink of alcohol    Types: 1 Glasses of wine per week    Comment: occasionally   Drug use: No   Sexual activity: Not on file  Other Topics Concern   Not on file  Social History Narrative   Not on file   Social Drivers of Health   Tobacco Use: Low Risk (08/14/2024)   Patient History    Smoking Tobacco Use: Never    Smokeless Tobacco Use: Never    Passive Exposure: Not on file  Financial Resource Strain: Not on file  Food Insecurity: Not on file  Transportation Needs: Not on file  Physical Activity: Not on file  Stress: Not on file  Social Connections: Not on file  Intimate Partner Violence: Not on file  Depression (PHQ2-9): Low Risk (07/09/2024)    Depression (PHQ2-9)    PHQ-2 Score: 0  Alcohol Screen: Low Risk (08/23/2022)   Alcohol Screen    Last Alcohol Screening Score (AUDIT): 2  Housing: Not on file  Utilities: Not on file  Health Literacy: Not on file      Review of Systems  Constitutional:  Positive for appetite change and unexpected weight change. Negative for chills and fatigue.  HENT:  Negative for congestion, postnasal drip, rhinorrhea, sneezing and sore throat.   Respiratory: Negative.  Negative for cough, chest tightness, shortness of breath and wheezing.   Cardiovascular: Negative.  Negative for chest pain and palpitations.  Gastrointestinal:  Positive for diarrhea. Negative for abdominal pain, constipation, nausea and vomiting.  Genitourinary:  Negative for dysuria and frequency.  Musculoskeletal:  Negative for arthralgias, back pain, joint swelling and neck pain.  Skin:  Negative for rash.  Neurological: Negative.   Hematological:  Negative for adenopathy. Does not bruise/bleed easily.  Psychiatric/Behavioral:  Positive for sleep disturbance. Negative for behavioral problems (Depression) and suicidal ideas. The patient is not nervous/anxious.     Vital Signs: BP 118/86   Pulse 92   Temp (!) 96.4 F (35.8 C)   Resp 16   Ht 5' 3 (1.6 m)   Wt 156 lb (70.8 kg) Comment: at home  SpO2 97%   BMI 27.63 kg/m    Physical Exam Vitals reviewed.  Constitutional:      General: She is not in acute distress.    Appearance: Normal appearance. She is obese. She is not ill-appearing.  HENT:     Head: Normocephalic and atraumatic.  Eyes:     Pupils: Pupils are equal, round, and reactive to light.  Cardiovascular:     Rate and Rhythm: Normal rate and regular rhythm.  Pulmonary:     Effort: Pulmonary effort is normal. No respiratory distress.  Skin:    Capillary Refill: Capillary refill takes less than 2 seconds.  Neurological:     Mental Status: She is alert and oriented to person, place, and time.   Psychiatric:        Mood and Affect: Mood normal.        Behavior: Behavior normal.        Assessment/Plan: 1. Elevated ferritin (Primary) Declined hematology evaluation for now. This was already done about 10 years ago and nothing was found. She will call the clinic if she changes her mind.   2. Inflammatory polyarthropathy of multiple sites (HCC) Continue prn tramadol  as prescribed.   3. Nausea without vomiting Discontinue zofran  and start promethazine  as needed as prescribed.  - promethazine  (PHENERGAN ) 12.5 MG tablet; Take 1 tablet (12.5 mg total) by mouth every 8 (eight) hours as needed for nausea or vomiting.  Dispense: 20 tablet; Refill: 5  4. Overweight with body mass index (BMI) of 27 to 27.9 in adult Continue compounded GLP1 as prescribed by online provider if desired.    General Counseling: mckay brandt understanding of the findings of todays visit and agrees with plan of treatment. I have discussed any further diagnostic evaluation that may be needed or ordered today. We also reviewed her medications today. she has been encouraged to call the office with any questions or concerns that should arise related to todays visit.    No orders of the defined types were placed in this encounter.   Meds ordered this encounter  Medications   promethazine  (PHENERGAN ) 12.5 MG tablet    Sig: Take 1 tablet (12.5 mg total) by mouth every 8 (eight) hours as needed for nausea or vomiting.    Dispense:  20 tablet    Refill:  5    Discontinue ondansetron  and fill new script today    Return in about 8 weeks (around 10/07/2024) for F/U, anxiety med refill, Leda [place] PCP alprazolam  .   Total time spent:30 Minutes Time spent includes review of chart, medications, test results, and follow up plan with the patient.   [place] Controlled Substance Database was reviewed by me.  This patient was seen by Mardy Maxin, FNP-C in collaboration with Dr. Sigrid Bathe as a part of collaborative  care agreement.   Briaunna Grindstaff R.  Liana, MSN, FNP-C Internal medicine

## 2024-08-27 ENCOUNTER — Ambulatory Visit: Admitting: Cardiology

## 2024-08-27 ENCOUNTER — Encounter: Payer: Self-pay | Admitting: Cardiology

## 2024-08-27 VITALS — BP 108/68 | HR 91 | Ht 63.0 in | Wt 171.0 lb

## 2024-08-27 DIAGNOSIS — I471 Supraventricular tachycardia, unspecified: Secondary | ICD-10-CM | POA: Diagnosis not present

## 2024-08-27 DIAGNOSIS — I491 Atrial premature depolarization: Secondary | ICD-10-CM | POA: Diagnosis not present

## 2024-08-27 DIAGNOSIS — R002 Palpitations: Secondary | ICD-10-CM | POA: Diagnosis not present

## 2024-08-27 NOTE — Patient Instructions (Addendum)

## 2024-08-27 NOTE — Progress Notes (Signed)
" °  Electrophysiology Office Note:   Date:  08/27/2024  ID:  Yvette Humphrey, DOB May 20, 1976, MRN [id number]  Primary Cardiologist: [name], MD Electrophysiologist: Fonda Kitty, MD      History of Present Illness:   Yvette Humphrey is a 49 y.o. female with h/o SVT and hyperlipidemia who is being seen today for EP evaluation.  Discussed the use of AI scribe software for clinical note transcription with the patient, who gave verbal consent to proceed.  History of Present Illness Yvette Humphrey is a 49 year old female who presents with palpitations and extra heartbeats. She was referred by [facility] for evaluation based on the results of a heart monitor.  She experiences palpitations and perceives extra heartbeats. A heart monitor revealed occasional premature atrial contractions (PACs) from the atria, occurring less than one percent of the time. Despite her infrequency, she is highly sensitive to these extra beats.  She is currently on metoprolol , 50 mg in the morning and 50 mg at night, totaling 100 mg daily. She notes improvement, especially in the morning.  She is concerned about potential triggers for her symptoms, such as stress, caffeine , salty or greasy foods, alcohol, and sleep deprivation. She experienced an episode earlier today and suspects stress may be a contributing factor.    Review of systems complete and found to be negative unless listed in HPI.   EP Information / Studies Reviewed:    EKG is not ordered today. EKG from 06/11/24 reviewed which showed SR.     Zio 06/2024   Physical Exam:   VS:  BP 108/68   Pulse 91   Ht 5' 3 (1.6 m)   Wt 171 lb (77.6 kg)   SpO2 95%   BMI 30.29 kg/m    Wt Readings from Last 3 Encounters:  08/27/24 171 lb (77.6 kg)  08/14/24 156 lb (70.8 kg)  07/09/24 157 lb (71.2 kg)     General: Well developed, in no acute distress.  Neck: No JVD.  Cardiac: Normal rate, regular rhythm.  Resp: Normal work of breathing.  Ext: No edema.   Neuro: No gross focal deficits.  Psych: Normal affect.   ASSESSMENT AND PLAN:    #Palpitations: Patient had a total of 123 symptom triggered events over a 14 day period while wearing Zio. The vast majority were sinus rhythm with single PACs. #PACs: Rare, <1% on Zio but feels them.  #PSVT: Longest 6 beats. -Continue metoprolol  XL 50mg  BID.  - Her burden of PACs is already very low and there has been no evidence of sustained SVT. At this point, I do not think benefits of catheter ablation or anti-arrhythmic therapy outweigh risks.  - If she feels burden is increasing or she is having sustained SVT then ILR might be of benefit to document episodes and correlate with symptoms.  - Will order an echocardiogram.   Follow up with general cardiology. EP as needed.   Signed, Fonda Kitty, MD  "

## 2024-08-27 NOTE — Addendum Note (Signed)
 Addended by: KENNYTH CHEW D on: 08/27/2024 10:21 PM   Modules accepted: Orders

## 2024-08-28 NOTE — Telephone Encounter (Signed)
 Spoke with the patient and she is aware that Dr. Kennyth would like for her to have an echocardiogram. All questions answered.

## 2024-09-05 ENCOUNTER — Encounter: Payer: Self-pay | Admitting: Nurse Practitioner

## 2024-09-05 MED ORDER — METOPROLOL SUCCINATE ER 50 MG PO TB24: 50.0000 mg | ORAL_TABLET | Freq: Two times a day (BID) | ORAL | 3 refills | Status: AC

## 2024-09-09 MED ORDER — WEGOVY 1.5 MG PO TABS: 1.5000 mg | ORAL_TABLET | Freq: Every day | ORAL | 0 refills | Status: AC

## 2024-09-09 MED ORDER — WEGOVY 4 MG PO TABS: 4.0000 mg | ORAL_TABLET | Freq: Every day | ORAL | 0 refills | Status: AC

## 2024-09-20 ENCOUNTER — Ambulatory Visit

## 2024-10-10 ENCOUNTER — Ambulatory Visit: Admitting: Nurse Practitioner

## 2024-12-09 ENCOUNTER — Ambulatory Visit: Admitting: Medical

## 2025-07-10 ENCOUNTER — Encounter: Admitting: Nurse Practitioner
